# Patient Record
Sex: Female | Born: 1995 | Race: Black or African American | Hispanic: No | Smoking: Never smoker
Health system: Southern US, Community
[De-identification: ages and names within clinical notes are randomized; demographics above are authoritative.]

## PROBLEM LIST (undated history)

## (undated) ENCOUNTER — Inpatient Hospital Stay (HOSPITAL_COMMUNITY): Payer: Medicaid Other

## (undated) DIAGNOSIS — R011 Cardiac murmur, unspecified: Secondary | ICD-10-CM

## (undated) DIAGNOSIS — O24419 Gestational diabetes mellitus in pregnancy, unspecified control: Secondary | ICD-10-CM

## (undated) HISTORY — PX: NO PAST SURGERIES: SHX2092

## (undated) HISTORY — DX: Morbid (severe) obesity due to excess calories: E66.01

## (undated) HISTORY — DX: Cardiac murmur, unspecified: R01.1

---

## 2018-08-14 ENCOUNTER — Ambulatory Visit (INDEPENDENT_AMBULATORY_CARE_PROVIDER_SITE_OTHER): Payer: Medicaid Other

## 2018-08-14 ENCOUNTER — Encounter (HOSPITAL_COMMUNITY): Payer: Self-pay | Admitting: *Deleted

## 2018-08-14 ENCOUNTER — Encounter: Payer: Self-pay | Admitting: Obstetrics

## 2018-08-14 ENCOUNTER — Other Ambulatory Visit: Payer: Self-pay

## 2018-08-14 ENCOUNTER — Inpatient Hospital Stay (HOSPITAL_COMMUNITY): Payer: Medicaid Other

## 2018-08-14 ENCOUNTER — Inpatient Hospital Stay (HOSPITAL_COMMUNITY)
Admission: AD | Admit: 2018-08-14 | Discharge: 2018-08-14 | Disposition: A | Discharge status: Home / Self Care | Payer: Medicaid Other | Attending: Obstetrics and Gynecology | Admitting: Obstetrics and Gynecology

## 2018-08-14 DIAGNOSIS — R102 Pelvic and perineal pain: Secondary | ICD-10-CM | POA: Insufficient documentation

## 2018-08-14 DIAGNOSIS — O26891 Other specified pregnancy related conditions, first trimester: Secondary | ICD-10-CM | POA: Diagnosis not present

## 2018-08-14 DIAGNOSIS — O468X1 Other antepartum hemorrhage, first trimester: Secondary | ICD-10-CM

## 2018-08-14 DIAGNOSIS — Z6711 Type A blood, Rh negative: Secondary | ICD-10-CM

## 2018-08-14 DIAGNOSIS — O209 Hemorrhage in early pregnancy, unspecified: Secondary | ICD-10-CM

## 2018-08-14 DIAGNOSIS — O418X10 Other specified disorders of amniotic fluid and membranes, first trimester, not applicable or unspecified: Secondary | ICD-10-CM | POA: Diagnosis not present

## 2018-08-14 DIAGNOSIS — Z3201 Encounter for pregnancy test, result positive: Secondary | ICD-10-CM

## 2018-08-14 DIAGNOSIS — O208 Other hemorrhage in early pregnancy: Secondary | ICD-10-CM | POA: Insufficient documentation

## 2018-08-14 DIAGNOSIS — Z3200 Encounter for pregnancy test, result unknown: Secondary | ICD-10-CM

## 2018-08-14 DIAGNOSIS — Z3A01 Less than 8 weeks gestation of pregnancy: Secondary | ICD-10-CM | POA: Diagnosis not present

## 2018-08-14 LAB — URINALYSIS, ROUTINE W REFLEX MICROSCOPIC
Bilirubin Urine: NEGATIVE
Glucose, UA: NEGATIVE mg/dL
Ketones, ur: 5 mg/dL — AB
Leukocytes,Ua: NEGATIVE
Nitrite: NEGATIVE
Protein, ur: 100 mg/dL — AB
Specific Gravity, Urine: 1.025 (ref 1.005–1.030)
pH: 5 (ref 5.0–8.0)

## 2018-08-14 LAB — POCT URINE PREGNANCY: Preg Test, Ur: POSITIVE — AB

## 2018-08-14 LAB — WET PREP, GENITAL
Sperm: NONE SEEN
Trich, Wet Prep: NONE SEEN
Yeast Wet Prep HPF POC: NONE SEEN

## 2018-08-14 LAB — CBC
HCT: 37.4 % (ref 36.0–46.0)
Hemoglobin: 12.8 g/dL (ref 12.0–15.0)
MCH: 31.8 pg (ref 26.0–34.0)
MCHC: 34.2 g/dL (ref 30.0–36.0)
MCV: 92.8 fL (ref 80.0–100.0)
Platelets: 264 10*3/uL (ref 150–400)
RBC: 4.03 MIL/uL (ref 3.87–5.11)
RDW: 12.1 % (ref 11.5–15.5)
WBC: 11.7 10*3/uL — ABNORMAL HIGH (ref 4.0–10.5)
nRBC: 0 % (ref 0.0–0.2)

## 2018-08-14 LAB — HCG, QUANTITATIVE, PREGNANCY: hCG, Beta Chain, Quant, S: 66532 m[IU]/mL — ABNORMAL HIGH (ref <5)

## 2018-08-14 LAB — ABO/RH: ABO/RH(D): A NEG

## 2018-08-14 MED ORDER — RHO D IMMUNE GLOBULIN 1500 UNIT/2ML IJ SOSY
300.0000 ug | PREFILLED_SYRINGE | Freq: Once | INTRAMUSCULAR | Status: AC
  Administered 2018-08-14: 300 ug via INTRAMUSCULAR
  Filled 2018-08-14: qty 2

## 2018-08-14 NOTE — Progress Notes (Signed)
Vaginal cultures obtained by this RN. 

## 2018-08-14 NOTE — MAU Provider Note (Signed)
History     CSN: [id number]  Arrival date and time: 08/14/18 [id number]   First Provider Initiated Contact with Patient 08/14/18 1912      Chief Complaint  Patient presents with  . Vaginal Bleeding   Connie Fisher is a 23 y.o. G1P0 at 2946w0d who presents today with vaginal bleeding. She was seen in the office today for UPT. The bleeding started just prior to arrival here.   Vaginal Bleeding  The patient's primary symptoms include pelvic pain and vaginal bleeding. This is a new problem. The current episode started today (approx 30 mins PTA ). The problem occurs constantly. The problem has been gradually improving. The pain is mild. The problem affects both sides. She is pregnant. Associated symptoms include back pain and nausea. Pertinent negatives include no chills, dysuria, fever, frequency or vomiting. The vaginal discharge was bloody. The vaginal bleeding is lighter than menses. She has been passing clots (about the size of a grape ). She has not been passing tissue. Nothing aggravates the symptoms. She has tried nothing for the symptoms. Sexual activity: denies intercourse in the last 24 hours.  Her menstrual history has been irregular (LMP 06/26/2018).    OB History    Gravida  1   Para      Term      Preterm      AB      Living        SAB      TAB      Ectopic      Multiple      Live Births              Past Medical History:  Diagnosis Date  . Medical history non-contributory     Past Surgical History:  Procedure Laterality Date  . NO PAST SURGERIES      Family History  Problem Relation Age of Onset  . Hypertension Mother   . Diabetes Father     Social History   Tobacco Use  . Smoking status: Never Smoker  . Smokeless tobacco: Never Used  Substance Use Topics  . Alcohol use: Not Currently  . Drug use: Never    Allergies: Not on File  No medications prior to admission.    Review of Systems  Constitutional: Negative for chills and fever.   Gastrointestinal: Positive for nausea. Negative for vomiting.  Genitourinary: Positive for pelvic pain and vaginal bleeding. Negative for dysuria and frequency.  Musculoskeletal: Positive for back pain.   Physical Exam   Blood pressure (!) 117/57, pulse 91, temperature 97.9 F (36.6 C), temperature source Oral, resp. rate 18, height 5\' 3"  (1.6 m), weight 113 kg, last menstrual period 06/26/2018, SpO2 100 %.  Physical Exam  Nursing note and vitals reviewed. Constitutional: She is oriented to person, place, and time. She appears well-developed and well-nourished. No distress.  HENT:  Head: Normocephalic.  Cardiovascular: Normal rate.  Respiratory: Effort normal.  GI: Soft. There is no abdominal tenderness. There is no rebound.  Neurological: She is alert and oriented to person, place, and time.  Skin: Skin is warm and dry.  Psychiatric: She has a normal mood and affect.   Results for orders placed or performed during the hospital encounter of 08/14/18 (from the past 24 hour(s))  CBC     Status: Abnormal   Collection Time: 08/14/18  6:55 PM  Result Value Ref Range   WBC 11.7 (H) 4.0 - 10.5 K/uL   RBC 4.03 3.87 - 5.11 MIL/uL  Hemoglobin 12.8 12.0 - 15.0 g/dL   HCT 37.4 36.0 - 46.0 %   MCV 92.8 80.0 - 100.0 fL   MCH 31.8 26.0 - 34.0 pg   MCHC 34.2 30.0 - 36.0 g/dL   RDW 12.1 11.5 - 15.5 %   Platelets 264 150 - 400 K/uL   nRBC 0.0 0.0 - 0.2 %  ABO/Rh     Status: None   Collection Time: 08/14/18  6:55 PM  Result Value Ref Range   ABO/RH(D)      A NEG Performed at [facility] [address] [place], [place] [address]   Urinalysis, Routine w reflex microscopic     Status: Abnormal   Collection Time: 08/14/18  7:03 PM  Result Value Ref Range   Color, Urine YELLOW YELLOW   APPearance HAZY (A) CLEAR   Specific Gravity, Urine 1.025 1.005 - 1.030   pH 5.0 5.0 - 8.0   Glucose, UA NEGATIVE NEGATIVE mg/dL   Hgb urine dipstick LARGE (A) NEGATIVE   Bilirubin Urine NEGATIVE  NEGATIVE   Ketones, ur 5 (A) NEGATIVE mg/dL   Protein, ur 100 (A) NEGATIVE mg/dL   Nitrite NEGATIVE NEGATIVE   Leukocytes,Ua NEGATIVE NEGATIVE   RBC / HPF 0-5 0 - 5 RBC/hpf   WBC, UA 0-5 0 - 5 WBC/hpf   Bacteria, UA RARE (A) NONE SEEN   Squamous Epithelial / LPF 0-5 0 - 5   Mucus PRESENT   Wet prep, genital     Status: Abnormal   Collection Time: 08/14/18  7:03 PM  Result Value Ref Range   Yeast Wet Prep HPF POC NONE SEEN NONE SEEN   Trich, Wet Prep NONE SEEN NONE SEEN   Clue Cells Wet Prep HPF POC PRESENT (A) NONE SEEN   WBC, Wet Prep HPF POC FEW (A) NONE SEEN   Sperm NONE SEEN   Rh IG workup (includes ABO/Rh)     Status: None (Preliminary result)   Collection Time: 08/14/18  7:38 PM  Result Value Ref Range   Gestational Age(Wks) 7    ABO/RH(D)      A NEG Performed at [facility] [address] [place], [place] [address]    Antibody Screen PENDING      MAU Course  Procedures  MDM A negative, rhogam given today   Assessment and Plan   1. Vaginal bleeding affecting early pregnancy   2. Pelvic pain in pregnancy, antepartum, first trimester   3. Subchorionic hematoma in first trimester, single or unspecified fetus   4. [redacted] weeks gestation of pregnancy   5. Type A blood, Rh negative    DC home Comfort measures reviewed  1st Trimester precautions  Bleeding precautions RX: none  Return to MAU as needed FU with OB as planned  Ali Chuk for [facility] Follow up.   Specialty:  Obstetrics and Gynecology Contact information: [address], [id number] [place] [id number] [place] DNP, CNM  08/14/18  8:06 PM

## 2018-08-14 NOTE — MAU Note (Signed)
Started bleeding a lot a little while ago, with clots coming out.  Is about [redacted] wks pregnant. Little pain in lower and back.

## 2018-08-14 NOTE — Discharge Instructions (Signed)
Subchorionic Hematoma ° °A subchorionic hematoma is a gathering of blood between the outer wall of the embryo (chorion) and the inner wall of the womb (uterus). °This condition can cause vaginal bleeding. If they cause little or no vaginal bleeding, early small hematomas usually shrink on their own and do not affect your baby or pregnancy. When bleeding starts later in pregnancy, or if the hematoma is larger or occurs in older pregnant women, the condition may be more serious. Larger hematomas may get bigger, which increases the chances of miscarriage. This condition also increases the risk of: °· Premature separation of the placenta from the uterus. °· Premature (preterm) labor. °· Stillbirth. °What are the causes? °The exact cause of this condition is not known. It occurs when blood is trapped between the placenta and the uterine wall because the placenta has separated from the original site of implantation. °What increases the risk? °You are more likely to develop this condition if: °· You were treated with fertility medicines. °· You conceived through in vitro fertilization (IVF). °What are the signs or symptoms? °Symptoms of this condition include: °· Vaginal spotting or bleeding. °· Contractions of the uterus. These cause abdominal pain. °Sometimes you may have no symptoms and the bleeding may only be seen when ultrasound images are taken (transvaginal ultrasound). °How is this diagnosed? °This condition is diagnosed based on a physical exam. This includes a pelvic exam. You may also have other tests, including: °· Blood tests. °· Urine tests. °· Ultrasound of the abdomen. °How is this treated? °Treatment for this condition can vary. Treatment may include: °· Watchful waiting. You will be monitored closely for any changes in bleeding. During this stage: °? The hematoma may be reabsorbed by the body. °? The hematoma may separate the fluid-filled space containing the embryo (gestational sac) from the wall of the  womb (endometrium). °· Medicines. °· Activity restriction. This may be needed until the bleeding stops. °Follow these instructions at home: °· Stay on bed rest if told to do so by your health care provider. °· Do not lift anything that is heavier than 10 lbs. (4.5 kg) or as told by your health care provider. °· Do not use any products that contain nicotine or tobacco, such as cigarettes and e-cigarettes. If you need help quitting, ask your health care provider. °· Track and write down the number of pads you use each day and how soaked (saturated) they are. °· Do not use tampons. °· Keep all follow-up visits as told by your health care provider. This is important. Your health care provider may ask you to have follow-up blood tests or ultrasound tests or both. °Contact a health care provider if: °· You have any vaginal bleeding. °· You have a fever. °Get help right away if: °· You have severe cramps in your stomach, back, abdomen, or pelvis. °· You pass large clots or tissue. Save any tissue for your health care provider to look at. °· You have more vaginal bleeding, and you faint or become lightheaded or weak. °Summary °· A subchorionic hematoma is a gathering of blood between the outer wall of the placenta and the uterus. °· This condition can cause vaginal bleeding. °· Sometimes you may have no symptoms and the bleeding may only be seen when ultrasound images are taken. °· Treatment may include watchful waiting, medicines, or activity restriction. °This information is not intended to replace advice given to you by your health care provider. Make sure you discuss any questions you   have with your health care provider. °Document Released: 06/08/2006 Document Revised: 04/19/2016 Document Reviewed: 04/19/2016 °Elsevier Interactive Patient Education © 2019 [employer] ° °

## 2018-08-14 NOTE — Progress Notes (Signed)
Pt is here for UPT. LMP 06/26/18. UPT is positive. Pt instructed to return around 09/04/18 for new OB appt. Safe medication list given.

## 2018-08-15 LAB — RH IG WORKUP (INCLUDES ABO/RH)
ABO/RH(D): A NEG
Antibody Screen: NEGATIVE
Gestational Age(Wks): 7
Unit division: 0

## 2018-08-16 LAB — GC/CHLAMYDIA PROBE AMP (~~LOC~~) NOT AT ARMC
Chlamydia: NEGATIVE
Neisseria Gonorrhea: NEGATIVE

## 2018-08-28 ENCOUNTER — Other Ambulatory Visit: Payer: Self-pay

## 2018-08-28 ENCOUNTER — Ambulatory Visit (INDEPENDENT_AMBULATORY_CARE_PROVIDER_SITE_OTHER): Payer: Self-pay

## 2018-08-28 DIAGNOSIS — Z3400 Encounter for supervision of normal first pregnancy, unspecified trimester: Secondary | ICD-10-CM

## 2018-08-28 NOTE — Progress Notes (Signed)
I connected with  [facility] [place] on 08/28/18 by a video enabled telemedicine application and verified that I am speaking with the correct person using two identifiers.   I discussed the limitations of evaluation and management by telemedicine. The patient expressed understanding and agreed to proceed.  New OB Intake done, Babyscripts App downloaded.  Patient has New OB appt 09/10/2018 at 1:45 pm.

## 2018-09-10 ENCOUNTER — Other Ambulatory Visit: Payer: Self-pay

## 2018-09-10 ENCOUNTER — Other Ambulatory Visit (HOSPITAL_COMMUNITY)
Admission: RE | Admit: 2018-09-10 | Discharge: 2018-09-10 | Disposition: A | Discharge status: Home / Self Care | Payer: Medicaid Other | Source: Ambulatory Visit | Attending: Obstetrics & Gynecology | Admitting: Obstetrics & Gynecology

## 2018-09-10 ENCOUNTER — Encounter: Payer: Self-pay | Admitting: Obstetrics & Gynecology

## 2018-09-10 ENCOUNTER — Ambulatory Visit (INDEPENDENT_AMBULATORY_CARE_PROVIDER_SITE_OTHER): Payer: Medicaid Other | Admitting: Obstetrics & Gynecology

## 2018-09-10 VITALS — BP 91/58 | HR 80 | Wt 247.8 lb

## 2018-09-10 DIAGNOSIS — N760 Acute vaginitis: Secondary | ICD-10-CM

## 2018-09-10 DIAGNOSIS — Z3401 Encounter for supervision of normal first pregnancy, first trimester: Secondary | ICD-10-CM

## 2018-09-10 DIAGNOSIS — O26891 Other specified pregnancy related conditions, first trimester: Secondary | ICD-10-CM

## 2018-09-10 DIAGNOSIS — O99211 Obesity complicating pregnancy, first trimester: Secondary | ICD-10-CM

## 2018-09-10 DIAGNOSIS — Z3689 Encounter for other specified antenatal screening: Secondary | ICD-10-CM

## 2018-09-10 DIAGNOSIS — O99210 Obesity complicating pregnancy, unspecified trimester: Secondary | ICD-10-CM | POA: Insufficient documentation

## 2018-09-10 DIAGNOSIS — O219 Vomiting of pregnancy, unspecified: Secondary | ICD-10-CM

## 2018-09-10 DIAGNOSIS — Z3A10 10 weeks gestation of pregnancy: Secondary | ICD-10-CM

## 2018-09-10 DIAGNOSIS — R12 Heartburn: Secondary | ICD-10-CM

## 2018-09-10 DIAGNOSIS — Z3481 Encounter for supervision of other normal pregnancy, first trimester: Secondary | ICD-10-CM | POA: Diagnosis not present

## 2018-09-10 DIAGNOSIS — Z6791 Unspecified blood type, Rh negative: Secondary | ICD-10-CM

## 2018-09-10 DIAGNOSIS — B9689 Other specified bacterial agents as the cause of diseases classified elsewhere: Secondary | ICD-10-CM

## 2018-09-10 DIAGNOSIS — O23591 Infection of other part of genital tract in pregnancy, first trimester: Secondary | ICD-10-CM

## 2018-09-10 DIAGNOSIS — O360910 Maternal care for other rhesus isoimmunization, first trimester, not applicable or unspecified: Secondary | ICD-10-CM

## 2018-09-10 MED ORDER — DOXYLAMINE-PYRIDOXINE 10-10 MG PO TBEC: 2.0000 | DELAYED_RELEASE_TABLET | Freq: Every day | ORAL | 5 refills | Status: DC

## 2018-09-10 MED ORDER — FAMOTIDINE 20 MG PO TABS: 20.0000 mg | ORAL_TABLET | Freq: Two times a day (BID) | ORAL | 3 refills | Status: DC

## 2018-09-10 MED ORDER — BLOOD PRESSURE MONITOR KIT: 1.0000 | PACK | 0 refills | Status: DC

## 2018-09-10 MED ORDER — ASPIRIN EC 81 MG PO TBEC: 81.0000 mg | DELAYED_RELEASE_TABLET | Freq: Every day | ORAL | 2 refills | Status: DC

## 2018-09-10 NOTE — Progress Notes (Signed)
History:   Connie Fisher is a 23 y.o. G1P0 at 29w6dby LMP being seen today for her first obstetrical visit.  Her medical history is significant for morbid obesity. Patient does intend to breast feed. Pregnancy history fully reviewed.  Patient reports heartburn, nausea and vomiting. Desires medication to help.      HISTORY: OB History  Gravida Para Term Preterm AB Living  1 0 0 0 0 0  SAB TAB Ectopic Multiple Live Births  0 0 0 0 0    # Outcome Date GA Lbr Len/2nd Weight Sex Delivery Anes PTL Lv  1 Current           She has never had a pap smear, denies any GYN issues. Denies history of STIs.  Past Medical History:  Diagnosis Date  . Heart murmur    AS A BABY  . Morbid obesity ([facility]    Past Surgical History:  Procedure Laterality Date  . NO PAST SURGERIES     Family History  Problem Relation Age of Onset  . Hypertension Mother   . Diabetes Father   . Asthma Brother   . Arthritis Paternal Uncle   . Cancer Paternal Uncle   . Diabetes Paternal Grandmother    Social History   Tobacco Use  . Smoking status: Never Smoker  . Smokeless tobacco: Never Used  Substance Use Topics  . Alcohol use: Not Currently  . Drug use: Never   No Known Allergies Current Outpatient Medications on File Prior to Visit  Medication Sig Dispense Refill  . Prenatal Vit w/Fe-Methylfol-FA (PNV PO) Take by mouth.     No current facility-administered medications on file prior to visit.     Review of Systems Pertinent items noted in HPI and remainder of comprehensive ROS otherwise negative. Physical Exam:   Vitals:   09/10/18 1349  BP: (!) 91/58  Pulse: 80  Weight: 247 lb 12.8 oz (112.4 kg)   Fetal Heart Rate (bpm): 170 +sono**Bedside Ultrasound for FHR check: Patient informed that the ultrasound is considered a limited obstetric ultrasound and is not intended to be a complete ultrasound exam.  Patient also informed that the ultrasound is not being completed with the intent of  assessing for fetal or placental anomalies or any pelvic abnormalities.  Explained that the purpose of today's ultrasound is to assess for fetal heart rate.  Patient acknowledges the purpose of the exam and the limitations of the study.  Uterus:     Pelvic Exam: Perineum: no hemorrhoids, normal perineum   Vulva: normal external genitalia, no lesions   Vagina:  normal mucosa, normal discharge   Cervix: no lesions and normal, pap smear done.    Adnexa: normal adnexa and no mass, fullness, tenderness   Bony Pelvis: average  System: General: well-developed, well-nourished female in no acute distress   Breasts:  normal appearance, no masses or tenderness bilaterally   Skin: normal coloration and turgor, no rashes   Neurologic: oriented, normal, negative, normal mood   Extremities: normal strength, tone, and muscle mass, ROM of all joints is normal   HEENT PERRLA, extraocular movement intact and sclera clear, anicteric   Mouth/Teeth mucous membranes moist, pharynx normal without lesions and dental hygiene good   Neck supple and no masses   Cardiovascular: regular rate and rhythm   Respiratory:  no respiratory distress, normal breath sounds   Abdomen: soft, non-tender; bowel sounds normal; no masses,  no organomegaly      Assessment:    Pregnancy:  G1P0 Patient Active Problem List   Diagnosis Date Noted  . Maternal morbid obesity, antepartum ([place]) 09/10/2018  . Supervision of normal first pregnancy 08/28/2018     Plan:    1. Nausea/vomiting in pregnancy Encouraged ginger containing aids. Diclegis also prescribed.  - Doxylamine-Pyridoxine (DICLEGIS) 10-10 MG TBEC; Take 2 tablets by mouth at bedtime. If symptoms persist, add one tablet in the morning and one in the afternoon  Dispense: 100 tablet; Refill: 5  2. Heartburn during pregnancy in first trimester Tums recommended, Pepcid also prescribed. - famotidine (PEPCID) 20 MG tablet; Take 1 tablet (20 mg total) by mouth 2 (two) times  daily.  Dispense: 60 tablet; Refill: 3  3. Maternal morbid obesity, antepartum ([facility]) '[x]'  Aspirin 81 mg daily after 12 weeks ordered '[x]'  Nutrition consult ordered '[x]'  Recommended weight gain 11-20 lbs for singleton pregnancy (IOM guidelines) . Higher class of obesity patients recommended to gain closer to lower limit  . Weight loss is associated with adverse outcomes '[x]'  Baseline and surveillance labs '[ ]'  Growth scans every 4-6 weeks as needed (fundal height likely inadequate in morbidly obese patients) - Comprehensive metabolic panel - Hemoglobin A1c - TSH - Protein / creatinine ratio, urine - aspirin EC 81 MG tablet; Take 1 tablet (81 mg total) by mouth daily. Take after 12 weeks for prevention of preeclampsia later in pregnancy  Dispense: 300 tablet; Refill: 2 - Referral to Nutrition and Diabetes Services - [place] MFM OB DETAIL +14 WK; Future  4. Encounter for fetal anatomic survey Anatomy scan ordered - [place] MFM OB DETAIL +14 WK; Future  5. Encounter for supervision of normal first pregnancy in first trimester - Enroll Patient in Babyscripts - Babyscripts Schedule Optimization - Culture, OB Urine - Cervicovaginal ancillary only( [facility]) - Cytology - PAP( [facility]) - Obstetric Panel, Including HIV - Genetic Screening - Blood Pressure Monitor KIT; 1 kit by Does not apply route once a week. Check BP weekly.  Large Cuff. DX:  Z13.6  Z34.86  Dispense: 1 kit; Refill: 0 - Prenatal Vit w/Fe-Methylfol-FA (PNV PO); Take by mouth. Initial labs drawn. Continue prenatal vitamins. Genetic Screening discussed, NIPS: ordered. Ultrasound discussed; fetal anatomic survey: ordered. Problem list reviewed and updated. The nature of [facility] with multiple MDs and other Advanced Practice Providers was explained to patient; also emphasized that residents, students are part of our team. Routine obstetric precautions reviewed. Return in about 4 weeks (around  10/08/2018) for Virtual OB Visit.     Verita Schneiders, MD, [facility] [employer], [place]

## 2018-09-10 NOTE — Patient Instructions (Addendum)
First Trimester of Pregnancy The first trimester of pregnancy is from week 1 until the end of week 13 (months 1 through 3). A week after a sperm fertilizes an egg, the egg will implant on the wall of the uterus. This embryo will begin to develop into a baby. Genes from you and your partner will form the baby. The female genes will determine whether the baby will be a boy or a girl. At 6-8 weeks, the eyes and face will be formed, and the heartbeat can be seen on ultrasound. At the end of 12 weeks, all the baby's organs will be formed. Now that you are pregnant, you will want to do everything you can to have a healthy baby. Two of the most important things are to get good prenatal care and to follow your health care provider's instructions. Prenatal care is all the medical care you receive before the baby's birth. This care will help prevent, find, and treat any problems during the pregnancy and childbirth. Body changes during your first trimester Your body goes through many changes during pregnancy. The changes vary from woman to woman.  You may gain or lose a couple of pounds at first.  You may feel sick to your stomach (nauseous) and you may throw up (vomit). If the vomiting is uncontrollable, call your health care provider.  You may tire easily.  You may develop headaches that can be relieved by medicines. All medicines should be approved by your health care provider.  You may urinate more often. Painful urination may mean you have a bladder infection.  You may develop heartburn as a result of your pregnancy.  You may develop constipation because certain hormones are causing the muscles that push stool through your intestines to slow down.  You may develop hemorrhoids or swollen veins (varicose veins).  Your breasts may begin to grow larger and become tender. Your nipples may stick out more, and the tissue that surrounds them (areola) may become darker.  Your gums may bleed and may be  sensitive to brushing and flossing.  Dark spots or blotches (chloasma, mask of pregnancy) may develop on your face. This will likely fade after the baby is born.  Your menstrual periods will stop.  You may have a loss of appetite.  You may develop cravings for certain kinds of food.  You may have changes in your emotions from day to day, such as being excited to be pregnant or being concerned that something may go wrong with the pregnancy and baby.  You may have more vivid and strange dreams.  You may have changes in your hair. These can include thickening of your hair, rapid growth, and changes in texture. Some women also have hair loss during or after pregnancy, or hair that feels dry or thin. Your hair will most likely return to normal after your baby is born. What to expect at prenatal visits During a routine prenatal visit:  You will be weighed to make sure you and the baby are growing normally.  Your blood pressure will be taken.  Your abdomen will be measured to track your baby's growth.  The fetal heartbeat will be listened to between weeks 10 and 14 of your pregnancy.  Test results from any previous visits will be discussed. Your health care provider may ask you:  How you are feeling.  If you are feeling the baby move.  If you have had any abnormal symptoms, such as leaking fluid, bleeding, severe headaches, or abdominal   cramping.  If you are using any tobacco products, including cigarettes, chewing tobacco, and electronic cigarettes.  If you have any questions. Other tests that may be performed during your first trimester include:  Blood tests to find your blood type and to check for the presence of any previous infections. The tests will also be used to check for low iron levels (anemia) and protein on red blood cells (Rh antibodies). Depending on your risk factors, or if you previously had diabetes during pregnancy, you may have tests to check for high blood sugar  that affects pregnant women (gestational diabetes).  Urine tests to check for infections, diabetes, or protein in the urine.  An ultrasound to confirm the proper growth and development of the baby.  Fetal screens for spinal cord problems (spina bifida) and Down syndrome.  HIV (human immunodeficiency virus) testing. Routine prenatal testing includes screening for HIV, unless you choose not to have this test.  You may need other tests to make sure you and the baby are doing well. Follow these instructions at home: Medicines  Follow your health care provider's instructions regarding medicine use. Specific medicines may be either safe or unsafe to take during pregnancy.  Take a prenatal vitamin that contains at least 600 micrograms (mcg) of folic acid.  If you develop constipation, try taking a stool softener if your health care provider approves. Eating and drinking   Eat a balanced diet that includes fresh fruits and vegetables, whole grains, good sources of protein such as meat, eggs, or tofu, and low-fat dairy. Your health care provider will help you determine the amount of weight gain that is right for you.  Avoid raw meat and uncooked cheese. These carry germs that can cause birth defects in the baby.  Eating four or five small meals rather than three large meals a day may help relieve nausea and vomiting. If you start to feel nauseous, eating a few soda crackers can be helpful. Drinking liquids between meals, instead of during meals, also seems to help ease nausea and vomiting.  Limit foods that are high in fat and processed sugars, such as fried and sweet foods.  To prevent constipation: ? Eat foods that are high in fiber, such as fresh fruits and vegetables, whole grains, and beans. ? Drink enough fluid to keep your urine clear or pale yellow. Activity  Exercise only as directed by your health care provider. Most women can continue their usual exercise routine during  pregnancy. Try to exercise for 30 minutes at least 5 days a week. Exercising will help you: ? Control your weight. ? Stay in shape. ? Be prepared for labor and delivery.  Experiencing pain or cramping in the lower abdomen or lower back is a good sign that you should stop exercising. Check with your health care provider before continuing with normal exercises.  Try to avoid standing for long periods of time. Move your legs often if you must stand in one place for a long time.  Avoid heavy lifting.  Wear low-heeled shoes and practice good posture.  You may continue to have sex unless your health care provider tells you not to. Relieving pain and discomfort  Wear a good support bra to relieve breast tenderness.  Take warm sitz baths to soothe any pain or discomfort caused by hemorrhoids. Use hemorrhoid cream if your health care provider approves.  Rest with your legs elevated if you have leg cramps or low back pain.  If you develop varicose veins in   your legs, wear support hose. Elevate your feet for 15 minutes, 3-4 times a day. Limit salt in your diet. Prenatal care  Schedule your prenatal visits by the twelfth week of pregnancy. They are usually scheduled monthly at first, then more often in the last 2 months before delivery.  Write down your questions. Take them to your prenatal visits.  Keep all your prenatal visits as told by your health care provider. This is important. Safety  Wear your seat belt at all times when driving.  Make a list of emergency phone numbers, including numbers for family, friends, the hospital, and police and fire departments. General instructions  Ask your health care provider for a referral to a local prenatal education class. Begin classes no later than the beginning of month 6 of your pregnancy.  Ask for help if you have counseling or nutritional needs during pregnancy. Your health care provider can offer advice or refer you to specialists for help  with various needs.  Do not use hot tubs, steam rooms, or saunas.  Do not douche or use tampons or scented sanitary pads.  Do not cross your legs for long periods of time.  Avoid cat litter boxes and soil used by cats. These carry germs that can cause birth defects in the baby and possibly loss of the fetus by miscarriage or stillbirth.  Avoid all smoking, herbs, alcohol, and medicines not prescribed by your health care provider. Chemicals in these products affect the formation and growth of the baby.  Do not use any products that contain nicotine or tobacco, such as cigarettes and e-cigarettes. If you need help quitting, ask your health care provider. You may receive counseling support and other resources to help you quit.  Schedule a dentist appointment. At home, brush your teeth with a soft toothbrush and be gentle when you floss. Contact a health care provider if:  You have dizziness.  You have mild pelvic cramps, pelvic pressure, or nagging pain in the abdominal area.  You have persistent nausea, vomiting, or diarrhea.  You have a bad smelling vaginal discharge.  You have pain when you urinate.  You notice increased swelling in your face, hands, legs, or ankles.  You are exposed to fifth disease or chickenpox.  You are exposed to German measles (rubella) and have never had it. Get help right away if:  You have a fever.  You are leaking fluid from your vagina.  You have spotting or bleeding from your vagina.  You have severe abdominal cramping or pain.  You have rapid weight gain or loss.  You vomit blood or material that looks like coffee grounds.  You develop a severe headache.  You have shortness of breath.  You have any kind of trauma, such as from a fall or a car accident. Summary  The first trimester of pregnancy is from week 1 until the end of week 13 (months 1 through 3).  Your body goes through many changes during pregnancy. The changes vary from  woman to woman.  You will have routine prenatal visits. During those visits, your health care provider will examine you, discuss any test results you may have, and talk with you about how you are feeling. This information is not intended to replace advice given to you by your health care provider. Make sure you discuss any questions you have with your health care provider. Document Released: 02/15/2001 Document Revised: 02/03/2017 Document Reviewed: 02/03/2016 Elsevier Patient Education  2020 Elsevier Inc[employer] Morning Sickness    Sickness  Morning sickness is when a woman feels nauseous during pregnancy. This nauseous feeling may or may not come with vomiting. It often occurs in the morning, but it can be a problem at any time of day. Morning sickness is most common during the first trimester. In some cases, it may continue throughout pregnancy. Although morning sickness is unpleasant, it is usually harmless unless the woman develops severe and continual vomiting (hyperemesis gravidarum), a condition that requires more intense treatment. What are the causes? The exact cause of this condition is not known, but it seems to be related to normal hormonal changes that occur in pregnancy. What increases the risk? You are more likely to develop this condition if:  You experienced nausea or vomiting before your pregnancy.  You had morning sickness during a previous pregnancy.  You are pregnant with more than one baby, such as twins. What are the signs or symptoms? Symptoms of this condition include:  Nausea.  Vomiting. How is this diagnosed? This condition is usually diagnosed based on your signs and symptoms. How is this treated? In many cases, treatment is not needed for this condition. Making some changes to what you eat may help to control symptoms. Your health care provider may also prescribe or recommend:  Vitamin B6 supplements.  Anti-nausea medicines.  Ginger. Follow these instructions at  home: Medicines  Take over-the-counter and prescription medicines only as told by your health care provider. Do not use any prescription, over-the-counter, or herbal medicines for morning sickness without first talking with your health care provider.  Taking multivitamins before getting pregnant can prevent or decrease the severity of morning sickness in most women. Eating and drinking  Eat a piece of dry toast or crackers before getting out of bed in the morning.  Eat 5 or 6 small meals a day.  Eat dry and bland foods, such as rice or a baked potato. Foods that are high in carbohydrates are often helpful.  Avoid greasy, fatty, and spicy foods.  Have someone cook for you if the smell of any food causes nausea and vomiting.  If you feel nauseous after taking prenatal vitamins, take the vitamins at night or with a snack.  Snack on protein foods between meals if you are hungry. Nuts, yogurt, and cheese are good options.  Drink fluids throughout the day.  Try ginger ale made with real ginger, ginger tea made from fresh grated ginger, or ginger candies. General instructions  Do not use any products that contain nicotine or tobacco, such as cigarettes and e-cigarettes. If you need help quitting, ask your health care provider.  Get an air purifier to keep the air in your house free of odors.  Get plenty of fresh air.  Try to avoid odors that trigger your nausea.  Consider trying these methods to help relieve symptoms: ? Wearing an acupressure wristband. These wristbands are often worn for seasickness. ? Acupuncture. Contact a health care provider if:  Your home remedies are not working and you need medicine.  You feel dizzy or light-headed.  You are losing weight. Get help right away if:  You have persistent and uncontrolled nausea and vomiting.  You faint.  You have severe pain in your abdomen. Summary  Morning sickness is when a woman feels nauseous during pregnancy.  This nauseous feeling may or may not come with vomiting.  Morning sickness is most common during the first trimester.  It often occurs in the morning, but it can be a problem at  any time of day.  In many cases, treatment is not needed for this condition. Making some changes to what you eat may help to control symptoms. This information is not intended to replace advice given to you by your health care provider. Make sure you discuss any questions you have with your health care provider. Document Released: 04/14/2006 Document Revised: 02/03/2017 Document Reviewed: 03/26/2016 Elsevier Patient Education  2020 [employer]vier Inc.   Heartburn During Pregnancy Heartburn is a type of pain or discomfort that can happen in the throat or chest. It is often described as a burning sensation. Heartburn is common during pregnancy because:  A hormone (progesterone) that is released during pregnancy may relax the valve (lower esophageal sphincter, or LES) that separates the esophagus from the stomach. This allows stomach acid to move up into the esophagus, causing heartburn.  The uterus gets larger and pushes up on the stomach, which pushes more acid into the esophagus. This is especially true in the later stages of pregnancy. Heartburn usually goes away or gets better after giving birth. What are the causes? Heartburn is caused by stomach acid backing up into the esophagus (reflux). Reflux can be triggered by:  Changing hormone levels.  Large meals.  Certain foods and beverages, such as coffee, chocolate, onions, and peppermint.  Exercise.  Increased stomach acid production. What increases the risk? You are more likely to experience heartburn during pregnancy if you:  Had heartburn prior to becoming pregnant.  Have been pregnant more than once before.  Are overweight or obese. The likelihood that you will get heartburn also increases as you get farther along in your pregnancy, especially during  the last trimester. What are the signs or symptoms? Symptoms of this condition include:  Burning pain in the chest or lower throat.  Bitter taste in the mouth.  Coughing.  Problems swallowing.  Vomiting.  Hoarse voice.  Asthma. Symptoms may get worse when you lie down or bend over. Symptoms are often worse at night. How is this diagnosed? This condition is diagnosed based on:  Your medical history.  Your symptoms.  Blood tests to check for a certain type of bacteria associated with heartburn.  Whether taking heartburn medicine relieves your symptoms.  Examination of the stomach and esophagus using a tube with a light and camera on the end (endoscopy). How is this treated? Treatment varies depending on how severe your symptoms are. Your health care provider may recommend:  Over-the-counter medicines (antacids or acid reducers) for mild heartburn.  Prescription medicines to decrease stomach acid or to protect your stomach lining.  Certain changes in your diet.  Raising the head of your bed so it is higher than the foot of the bed. This helps prevent stomach acid from backing up into the esophagus when you are lying down. Follow these instructions at home: Eating and drinking  Do not drink alcohol during your pregnancy.  Identify foods and beverages that make your symptoms worse, and avoid them.  Beverages that you may want to avoid include: ? Coffee and tea (with or without caffeine). ? Energy drinks and sports drinks. ? Carbonated drinks or sodas. ? Citrus fruit juices.  Foods that you may want to avoid include: ? Chocolate and cocoa. ? Peppermint and mint flavorings. ? Garlic, onions, and horseradish. ? Spicy and acidic foods, including peppers, chili powder, curry powder, vinegar, hot sauces, and barbecue sauce. ? Citrus fruits, such as oranges, lemons, and limes. ? Tomato-based foods, such as red sauce, chili, and  salsa. ? Fried and fatty foods, such as  donuts, french fries, potato chips, and high-fat dressings. ? High-fat meats, such as hot dogs, cold cuts, sausage, ham, and bacon. ? High-fat dairy items, such as whole milk, butter, and cheese.  Eat small, frequent meals instead of large meals.  Avoid drinking large amounts of liquid with your meals.  Avoid eating meals during the 2-3 hours before bedtime.  Avoid lying down right after you eat.  Do not exercise right after you eat. Medicines  Take over-the-counter and prescription medicines only as told by your health care provider.  Do not take aspirin, ibuprofen, or other NSAIDs unless your health care provider tells you to do that.  You may be instructed to avoid medicines that contain sodium bicarbonate. General instructions   If directed, raise the head of your bed about 6 inches (15 cm) by putting blocks under the legs. Sleeping with more pillows does not effectively relieve heartburn because it only changes the position of your head.  Do not use any products that contain nicotine or tobacco, such as cigarettes and e-cigarettes. If you need help quitting, ask your health care provider.  Wear loose-fitting clothing.  Try to reduce your stress, such as with yoga or meditation. If you need help managing stress, ask your health care provider.  Maintain a healthy weight. If you are overweight, work with your health care provider to safely lose weight.  Keep all follow-up visits as told by your health care provider. This is important. Contact a health care provider if:  You develop new symptoms.  Your symptoms do not improve with treatment.  You have unexplained weight loss.  You have difficulty swallowing.  You make loud sounds when you breathe (wheeze).  You have a cough that does not go away.  You have frequent heartburn for more than 2 weeks.  You have nausea or vomiting that does not get better with treatment.  You have pain in your abdomen. Get help right  away if:  You have severe chest pain that spreads to your arm, neck, or jaw.  You feel sweaty, dizzy, or light-headed.  You have shortness of breath.  You have pain when swallowing.  You vomit, and your vomit looks like blood or coffee grounds.  Your stool is bloody or black. This information is not intended to replace advice given to you by your health care provider. Make sure you discuss any questions you have with your health care provider. Document Released: 02/19/2000 Document Revised: 05/22/2017 Document Reviewed: 11/09/2015 Elsevier Patient Education  2020 [employer]

## 2018-09-11 LAB — PROTEIN / CREATININE RATIO, URINE
Creatinine, Urine: 276.8 mg/dL
Protein, Ur: 22.7 mg/dL
Protein/Creat Ratio: 82 mg/g creat (ref 0–200)

## 2018-09-12 DIAGNOSIS — Z6791 Unspecified blood type, Rh negative: Secondary | ICD-10-CM | POA: Insufficient documentation

## 2018-09-12 LAB — OBSTETRIC PANEL, INCLUDING HIV
Basophils Absolute: 0 10*3/uL (ref 0.0–0.2)
Basos: 0 %
EOS (ABSOLUTE): 0.3 10*3/uL (ref 0.0–0.4)
Eos: 3 %
HIV Screen 4th Generation wRfx: NONREACTIVE
Hematocrit: 36 % (ref 34.0–46.6)
Hemoglobin: 12.7 g/dL (ref 11.1–15.9)
Hepatitis B Surface Ag: NEGATIVE
Immature Grans (Abs): 0 10*3/uL (ref 0.0–0.1)
Immature Granulocytes: 0 %
Lymphocytes Absolute: 2 10*3/uL (ref 0.7–3.1)
Lymphs: 21 %
MCH: 31.3 pg (ref 26.6–33.0)
MCHC: 35.3 g/dL (ref 31.5–35.7)
MCV: 89 fL (ref 79–97)
Monocytes Absolute: 0.8 10*3/uL (ref 0.1–0.9)
Monocytes: 8 %
Neutrophils Absolute: 6.6 10*3/uL (ref 1.4–7.0)
Neutrophils: 68 %
Platelets: 257 10*3/uL (ref 150–450)
RBC: 4.06 x10E6/uL (ref 3.77–5.28)
RDW: 11.6 % — ABNORMAL LOW (ref 11.7–15.4)
RPR Ser Ql: NONREACTIVE
Rh Factor: NEGATIVE
Rubella Antibodies, IGG: 5.26 index (ref >0.99)
WBC: 9.8 10*3/uL (ref 3.4–10.8)

## 2018-09-12 LAB — COMPREHENSIVE METABOLIC PANEL
ALT: 14 IU/L (ref 0–32)
AST: 9 IU/L (ref 0–40)
Albumin/Globulin Ratio: 1.6 (ref 1.2–2.2)
Albumin: 4.2 g/dL (ref 3.9–5.0)
Alkaline Phosphatase: 56 IU/L (ref 39–117)
BUN/Creatinine Ratio: 12 (ref 9–23)
BUN: 6 mg/dL (ref 6–20)
Bilirubin Total: <0.2 mg/dL (ref 0.0–1.2)
CO2: 19 mmol/L — ABNORMAL LOW (ref 20–29)
Calcium: 9.1 mg/dL (ref 8.7–10.2)
Chloride: 103 mmol/L (ref 96–106)
Creatinine, Ser: 0.52 mg/dL — ABNORMAL LOW (ref 0.57–1.00)
GFR calc Af Amer: 156 mL/min/{1.73_m2} (ref >59)
GFR calc non Af Amer: 135 mL/min/{1.73_m2} (ref >59)
Globulin, Total: 2.6 g/dL (ref 1.5–4.5)
Glucose: 89 mg/dL (ref 65–99)
Potassium: 3.8 mmol/L (ref 3.5–5.2)
Sodium: 137 mmol/L (ref 134–144)
Total Protein: 6.8 g/dL (ref 6.0–8.5)

## 2018-09-12 LAB — AB SCR+ANTIBODY ID: Antibody Screen: POSITIVE — AB

## 2018-09-12 LAB — CYTOLOGY - PAP: Diagnosis: NEGATIVE

## 2018-09-12 LAB — CERVICOVAGINAL ANCILLARY ONLY
Bacterial vaginitis: POSITIVE — AB
Candida vaginitis: NEGATIVE
Chlamydia: NEGATIVE
Neisseria Gonorrhea: NEGATIVE
Trichomonas: NEGATIVE

## 2018-09-12 LAB — TSH: TSH: 0.462 u[IU]/mL (ref 0.450–4.500)

## 2018-09-12 LAB — HEMOGLOBIN A1C
Est. average glucose Bld gHb Est-mCnc: 114 mg/dL
Hgb A1c MFr Bld: 5.6 % (ref 4.8–5.6)

## 2018-09-12 LAB — URINE CULTURE, OB REFLEX

## 2018-09-12 LAB — CULTURE, OB URINE

## 2018-09-12 MED ORDER — METRONIDAZOLE 500 MG PO TABS: 500.0000 mg | ORAL_TABLET | Freq: Two times a day (BID) | ORAL | 0 refills | Status: DC

## 2018-09-12 NOTE — Addendum Note (Signed)
Addended by: Verita Schneiders A on: 09/12/2018 12:57 PM   Modules accepted: Orders

## 2018-09-14 DIAGNOSIS — Z136 Encounter for screening for cardiovascular disorders: Secondary | ICD-10-CM | POA: Diagnosis not present

## 2018-09-14 DIAGNOSIS — Z3480 Encounter for supervision of other normal pregnancy, unspecified trimester: Secondary | ICD-10-CM | POA: Diagnosis not present

## 2018-09-19 ENCOUNTER — Encounter: Payer: Self-pay | Admitting: Obstetrics & Gynecology

## 2018-09-21 ENCOUNTER — Encounter: Payer: Self-pay | Admitting: Obstetrics & Gynecology

## 2018-09-24 ENCOUNTER — Encounter: Payer: Self-pay | Admitting: Obstetrics & Gynecology

## 2018-09-24 DIAGNOSIS — Z148 Genetic carrier of other disease: Secondary | ICD-10-CM | POA: Insufficient documentation

## 2018-09-28 ENCOUNTER — Other Ambulatory Visit: Payer: Self-pay

## 2018-09-28 DIAGNOSIS — Z148 Genetic carrier of other disease: Secondary | ICD-10-CM

## 2018-09-28 DIAGNOSIS — O285 Abnormal chromosomal and genetic finding on antenatal screening of mother: Secondary | ICD-10-CM

## 2018-10-08 ENCOUNTER — Ambulatory Visit (INDEPENDENT_AMBULATORY_CARE_PROVIDER_SITE_OTHER): Payer: Medicaid Other | Admitting: Obstetrics & Gynecology

## 2018-10-08 VITALS — BP 125/74 | HR 111

## 2018-10-08 DIAGNOSIS — Z3A14 14 weeks gestation of pregnancy: Secondary | ICD-10-CM

## 2018-10-08 DIAGNOSIS — O99211 Obesity complicating pregnancy, first trimester: Secondary | ICD-10-CM

## 2018-10-08 DIAGNOSIS — Z6791 Unspecified blood type, Rh negative: Secondary | ICD-10-CM

## 2018-10-08 DIAGNOSIS — Z148 Genetic carrier of other disease: Secondary | ICD-10-CM

## 2018-10-08 DIAGNOSIS — O99210 Obesity complicating pregnancy, unspecified trimester: Secondary | ICD-10-CM

## 2018-10-08 DIAGNOSIS — O26891 Other specified pregnancy related conditions, first trimester: Secondary | ICD-10-CM

## 2018-10-08 DIAGNOSIS — O360910 Maternal care for other rhesus isoimmunization, first trimester, not applicable or unspecified: Secondary | ICD-10-CM

## 2018-10-08 DIAGNOSIS — Z3400 Encounter for supervision of normal first pregnancy, unspecified trimester: Secondary | ICD-10-CM

## 2018-10-08 NOTE — Progress Notes (Signed)
S/w pt to prepare for virtual visit, pt states she is unsure if she is feeling fetal movement yet. Pt complains of occasional pelvic pressure.

## 2018-10-08 NOTE — Progress Notes (Signed)
   [facility] VIRTUAL VIDEO VISIT ENCOUNTER NOTE  Provider location: [facility] [place]   I connected with [place] on 10/08/18 at  1:30 PM EDT by WebEx Video Encounter at home and verified that I am speaking with the correct person using two identifiers.   I discussed the limitations, risks, security and privacy concerns of performing an evaluation and management service virtually and the availability of in person appointments. I also discussed with the patient that there may be a patient responsible charge related to this service. The patient expressed understanding and agreed to proceed. Subjective:  Connie Fisher is a 23 y.o. G1P0 at [redacted]w[redacted]d being seen today for ongoing prenatal care.  She is currently monitored for the following issues for this high-risk pregnancy and has Supervision of normal first pregnancy; Maternal morbid obesity, antepartum ([place]); Rh negative state in antepartum period; and Genetic carrier status on their problem list.  Patient reports nausea improved.  Contractions: Not present. Vag. Bleeding: None.   . Denies any leaking of fluid.   The following portions of the patient's history were reviewed and updated as appropriate: allergies, current medications, past family history, past medical history, past social history, past surgical history and problem list.   Objective:   Vitals:   10/08/18 1331  BP: 125/74  Pulse: (!) 111    Fetal Status:           General:  Alert, oriented and cooperative. Patient is in no acute distress.  Respiratory: Normal respiratory effort, no problems with respiration noted  Mental Status: Normal mood and affect. Normal behavior. Normal judgment and thought content.  Rest of physical exam deferred due to type of encounter  Imaging: No results found.  Assessment and Plan:  Pregnancy: G1P0 at [redacted]w[redacted]d 1. Supervision of normal first pregnancy, antepartum Anatomy scan scheduled for 11/06/2018  Pt needs AFP in 3 weeks lab only   2. Rh negative state in antepartum period Needs Rhogam at 28 weeks    3. Maternal morbid obesity, antepartum ([place])   4. Genetic carrier status Genetic counseling appt 11/06/2018  Preterm labor symptoms and general obstetric precautions including but not limited to vaginal bleeding, contractions, leaking of fluid and fetal movement were reviewed in detail with the patient. I discussed the assessment and treatment plan with the patient. The patient was provided an opportunity to ask questions and all were answered. The patient agreed with the plan and demonstrated an understanding of the instructions. The patient was advised to call back or seek an in-person office evaluation/go to [facility] at [facility] for any urgent or concerning symptoms. Please refer to After Visit Summary for other counseling recommendations.   I provided 8 minutes of face-to-face time during this encounter.  Return in about 6 weeks (around 11/19/2018) for Web based.  Future Appointments  Date Time Provider [place]  10/09/2018 11:15 AM [place]  11/06/2018 10:45 AM [facility] NURSE [facility]  11/06/2018 10:45 AM [facility] [place] 2 [facility]  11/06/2018  1:00 PM [place] GENETIC COUNSELING [facility] [place] [facility]    Lavonia Drafts, MD [facility], [place]

## 2018-10-09 ENCOUNTER — Other Ambulatory Visit: Payer: Medicaid Other

## 2018-10-29 ENCOUNTER — Other Ambulatory Visit: Payer: Medicaid Other

## 2018-11-06 ENCOUNTER — Ambulatory Visit (HOSPITAL_COMMUNITY): Payer: Medicaid Other | Attending: Obstetrics and Gynecology

## 2018-11-06 ENCOUNTER — Ambulatory Visit (HOSPITAL_COMMUNITY)
Admission: RE | Admit: 2018-11-06 | Payer: Medicaid Other | Source: Ambulatory Visit | Attending: Obstetrics & Gynecology | Admitting: Obstetrics & Gynecology

## 2018-11-06 ENCOUNTER — Ambulatory Visit (HOSPITAL_COMMUNITY): Payer: Medicaid Other

## 2018-11-13 ENCOUNTER — Other Ambulatory Visit: Payer: Medicaid Other

## 2018-11-13 ENCOUNTER — Other Ambulatory Visit: Payer: Self-pay

## 2018-11-13 DIAGNOSIS — Z3400 Encounter for supervision of normal first pregnancy, unspecified trimester: Secondary | ICD-10-CM

## 2018-11-15 LAB — AFP, SERUM, OPEN SPINA BIFIDA
AFP MoM: 1.49
AFP Value: 61.6 ng/mL
Gest. Age on Collection Date: 20 weeks
Maternal Age At EDD: 23.8 yr
OSBR Risk 1 IN: 2809
Test Results:: NEGATIVE
Weight: 247 [lb_av]

## 2018-11-19 ENCOUNTER — Encounter: Payer: Medicaid Other | Admitting: Advanced Practice Midwife

## 2018-11-20 ENCOUNTER — Other Ambulatory Visit (HOSPITAL_COMMUNITY): Payer: Self-pay | Admitting: *Deleted

## 2018-11-20 ENCOUNTER — Other Ambulatory Visit: Payer: Self-pay | Admitting: Obstetrics & Gynecology

## 2018-11-20 ENCOUNTER — Other Ambulatory Visit: Payer: Self-pay

## 2018-11-20 ENCOUNTER — Ambulatory Visit (HOSPITAL_BASED_OUTPATIENT_CLINIC_OR_DEPARTMENT_OTHER): Payer: Medicaid Other | Admitting: Genetic Counselor

## 2018-11-20 ENCOUNTER — Ambulatory Visit (HOSPITAL_COMMUNITY)
Admission: RE | Admit: 2018-11-20 | Discharge: 2018-11-20 | Disposition: A | Discharge status: Home / Self Care | Payer: Medicaid Other | Source: Ambulatory Visit | Attending: Obstetrics and Gynecology | Admitting: Obstetrics and Gynecology

## 2018-11-20 ENCOUNTER — Encounter (HOSPITAL_COMMUNITY): Payer: Self-pay

## 2018-11-20 ENCOUNTER — Ambulatory Visit (HOSPITAL_COMMUNITY): Payer: Self-pay | Admitting: Genetic Counselor

## 2018-11-20 ENCOUNTER — Ambulatory Visit (HOSPITAL_COMMUNITY): Payer: Medicaid Other | Admitting: *Deleted

## 2018-11-20 VITALS — BP 122/84 | HR 108 | Temp 98.6°F

## 2018-11-20 DIAGNOSIS — O360190 Maternal care for anti-D [Rh] antibodies, unspecified trimester, not applicable or unspecified: Secondary | ICD-10-CM

## 2018-11-20 DIAGNOSIS — Z315 Encounter for genetic counseling: Secondary | ICD-10-CM

## 2018-11-20 DIAGNOSIS — O0990 Supervision of high risk pregnancy, unspecified, unspecified trimester: Secondary | ICD-10-CM | POA: Diagnosis not present

## 2018-11-20 DIAGNOSIS — Z3689 Encounter for other specified antenatal screening: Secondary | ICD-10-CM

## 2018-11-20 DIAGNOSIS — E71310 Long chain/very long chain acyl CoA dehydrogenase deficiency: Secondary | ICD-10-CM | POA: Diagnosis not present

## 2018-11-20 DIAGNOSIS — O99212 Obesity complicating pregnancy, second trimester: Secondary | ICD-10-CM

## 2018-11-20 DIAGNOSIS — Z3A21 21 weeks gestation of pregnancy: Secondary | ICD-10-CM

## 2018-11-20 DIAGNOSIS — Z3402 Encounter for supervision of normal first pregnancy, second trimester: Secondary | ICD-10-CM | POA: Diagnosis not present

## 2018-11-20 DIAGNOSIS — O99210 Obesity complicating pregnancy, unspecified trimester: Secondary | ICD-10-CM | POA: Insufficient documentation

## 2018-11-20 DIAGNOSIS — Z148 Genetic carrier of other disease: Secondary | ICD-10-CM

## 2018-11-20 DIAGNOSIS — Z3401 Encounter for supervision of normal first pregnancy, first trimester: Secondary | ICD-10-CM

## 2018-11-20 DIAGNOSIS — O360120 Maternal care for anti-D [Rh] antibodies, second trimester, not applicable or unspecified: Secondary | ICD-10-CM | POA: Diagnosis not present

## 2018-11-20 NOTE — Progress Notes (Signed)
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 Connie Fisher came to her appointment alone due to COVID-19 visitor restrictions.   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 female. Her current pregnancy has completed [redacted]w[redacted]d (Estimated Date of Delivery: 04/02/19).  Connie Fisher denied exposure to environmental toxins or chemical agents. She denied the use of alcohol, tobacco or street drugs. She reported taking prenatal vitamins. She denied significant viral illnesses, fevers, and bleeding during the course of her pregnancy. Her medical and surgical histories were noncontributory.  Family History  A three generation pedigree was drafted and reviewed. The family history is remarkable for the following:  - Connie Fisher was born with a "hole in her heart" that did not require surgical correction. The exact etiology of this congenital heart defect (CHD) is unknown and records are not available. Her remaining medical and developmental history is noncontributory. There are multifactorial causes for isolated CHDs, including environmental and genetic factors. As such, the recurrence risk for first degree relatives is elevated over the general population incidence of 1/200 (0.5%). We discussed that without knowing the exact etiology, the risk for any CHD in a child of an affected mother is approximately 5-6%. A fetal echocardiogram to assess for CHDs in the current pregnancy is recommended.  -Connie Fisher's mother has a history of blood clots. The remaining family history is negative for blood clots or bleeding disorders. Thrombophilias, or conditions in which there is an increased tendency to  develop a blood clot, may be inherited or acquired. Individuals with inherited clotting disorders have an increased risk of developing blood clots than the general population. If Connie Fisher mother has an inherited clotting disorder, Connie Fisher may be at increased risk to develop blood clots herself. However, without information about the etiology of her mother's blood clots, risk assessment is limited.  - Connie Fisher has a maternal half brother whose son has developmental delays, blindness in one eye, and an inguinal hernia. She is not sure if this individual has had genetic testing. Without knowing the etiology of this individual's, risk assessment is limited.  - Connie Fisher father has a brother who had cancer diagnosed in his 79s. Connie Fisher is not sure what type of cancer this individual had. There is no other family history of cancer. Though most cancers are thought to be sporadic or due to environmental factors, some families appear to have a predisposition to cancers. When considering a family history of cancer, we look for common types of cancer in multiple family members occurring at younger than typical ages. However, certain cancers are more likely to occur in younger individuals than others. Without additional information, risk assessment for an inherited cancer syndrome in the family is limited.  The remaining family histories were reviewed and found to be noncontributory for birth defects, intellectual disability, recurrent pregnancy loss, and known genetic conditions. Connie Fisher had limited information about her partner's family history; thus, risk assessment was limited.  The patient's ethnicity is [place] and [place] American. The father of the pregnancy's ethnicity is African American. Ashkenazi Jewish ancestry and consanguinity were denied. Pedigree will be scanned under Media.  Discussion  Connie Fisher had [id number] carrier screening performed through [place]. 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 MCAD deficiency is a condition that prevents the body from converting certain fats to energy, particularly during periods of fasting. MCAD deficiency is characterized by symptoms such as vomiting, lethargy, and hypoglycemia, most often during infancy or early childhood. Individuals affected by MCAD deficiency are at risk for serious complications including seizures, respiratory difficulties, liver problems, brain damage, coma, and sudden death. Symptoms of MCAD deficiency can be triggered by periods of fasting or illness. To avoid serious complications, infants with MCAD deficiency require frequent feedings with adequate calories from complex carbohydrates to maintain their blood sugar levels and avoid hypoglycemia.   MCAD deficiency is caused by mutations in the ACADM gene. This gene provides instructions for making an enzyme called medium-chain acyl-CoA dehydrogenase (MCAD). This enzyme is required to metabolize a group of fats found in foods and the body's fat tissues called medium-chain fatty acids. Pathogenic variants in the ACADM gene cause a deficiency of the MCAD enzyme, preventing medium chain fatty acids from being metabolized properly. As a result, these fats are not converted to energy, which can lead to the symptoms of this disorder. Additionally, medium-chain fatty acids may build up in tissues and damage the liber and brain. MCAD deficiency is inherited in an autosomal recessive pattern, meaning the current fetus is only at risk for MCAD deficiency if Connie Fisher partner is also a carrier for the condition. Based on his ethnicity, Connie Fisher partner has a 1 in 172 chance of being a carrier for MCAD deficiency. IfMs. Connie Fisher's partner is also a carrier,the risk forMCAD deficiencyin the pregnancy would be 1 in 4 (25%). However, the condition is more common in people of northern European  ancestry than in other ethnic groups.  Connie Fisher carrier screening was negative for the other 13 conditions screened. Thus, her risk to be a carrier for these additional conditions (listed separately in the laboratory report) has been reduced but not eliminated. We discussed that carrier testing for MCAD deficiency is recommended for Connie Fisher partner. Connie Fisher indicated that she is interested in pursuing partner carrier screening.  We also reviewed that Connie Fisher had [employer] NIPS through Connie Fisher that was low-risk for fetal aneuploidies. We reviewed that these results showed a less than 1 in 10,000 risk for trisomies 21, 18 and 13, and monosomy X (Turner syndrome).  In addition, the risk for triploidy and sex chromosome trisomies (47,XXX and 47,XXY) was also low. Connie Fisher elected to have cffDNA analysis for 22q11 deletion syndrome, which was also low risk (1 in 2900). We reviewed that while this testing identifies > 99% of pregnancies with trisomy 70, trisomy 76, sex chromosome trisomies (47,XXX and 47,XXY), and triploidy, it is NOT diagnostic. A positive test result requires confirmation by CVS or amniocentesis, and a negative test result does not rule out a fetal chromosome abnormality. She also understands that this testing does not identify all genetic conditions.  A complete ultrasound was performed today prior to our visit. The ultrasound report will be sent under separate cover. There were no visualized fetal anomalies or markers suggestive of aneuploidy.  Connie Fisher was also counseled regarding diagnostic testing via amniocentesis. We discussed the technical aspects of the procedure and quoted up to a 1 in 500 (0.2%) risk for spontaneous pregnancy loss or other adverse pregnancy outcomes as a result of amniocentesis. Cultured cells from an amniocentesis sample allow for the visualization of a fetal karyotype, which can detect >99% of chromosomal aberrations.  Chromosomal microarray  can also be performed to identify smaller deletions or duplications of fetal chromosomal material. Amniocentesis could also be performed to assess whether the baby is affected by MCAD deficiency. After careful consideration, Connie Fisher declined amniocentesis at this time. She understands that amniocentesis is available at any point after 16 weeks of pregnancy and that she may opt to undergo the procedure at a later date should she change her mind.  Connie Fisher desires carrier screening for MCAD deficiency for her partner, Connie Fisher; however, she wanted to talk to him first to determine if he has health insurance. Connie Fisher was provided with my business card and instructed to call me when her and her partner have discussed testing. If he does not have health insurance, we can explore the possibility of free carrier screening through the laboratory [facility] by applying to their Patient Assistance Program. Once I hear from Connie Fisher, I will coordinate sample collection and facilitate the testing process.  I counseled Connie Fisher regarding the above risks and available options. The approximate face-to-face time with the genetic counselor was 25 minutes.  In summary:  Discussed carrier screening results and options for follow-up testing  Carrier for MCAD deficiency  Desires partner carrier screening. Will call me to facilitate sample collection and testing process once she discusses testing with her partner.  Reviewed low-risk NIPS results  Reduction in risk for Down syndrome,trisomy 18,trisomy 13, sex chromosome aneuploidies, and 22q11 deletion syndrome   Reviewed results of ultrasound  No fetal anomalies or markers seen  Reduction in risk for fetal aneuploidy  Offered additional testing and screening  Declined amniocentesis  Reviewed family history concerns  Recommend fetal echocardiogram due to Ms. Dessa PhiKearney's history of CHD   Gershon CraneHaley E Tavoris Brisk, MS Genetic Counselor

## 2018-11-21 ENCOUNTER — Telehealth: Payer: Self-pay | Admitting: Advanced Practice Midwife

## 2018-12-04 ENCOUNTER — Other Ambulatory Visit: Payer: Self-pay

## 2018-12-04 ENCOUNTER — Ambulatory Visit (INDEPENDENT_AMBULATORY_CARE_PROVIDER_SITE_OTHER): Payer: Medicaid Other | Admitting: Obstetrics

## 2018-12-04 ENCOUNTER — Encounter: Payer: Self-pay | Admitting: Obstetrics

## 2018-12-04 VITALS — BP 124/84 | HR 89

## 2018-12-04 DIAGNOSIS — O285 Abnormal chromosomal and genetic finding on antenatal screening of mother: Secondary | ICD-10-CM

## 2018-12-04 DIAGNOSIS — Z3400 Encounter for supervision of normal first pregnancy, unspecified trimester: Secondary | ICD-10-CM

## 2018-12-04 DIAGNOSIS — O26892 Other specified pregnancy related conditions, second trimester: Secondary | ICD-10-CM

## 2018-12-04 DIAGNOSIS — Z3A23 23 weeks gestation of pregnancy: Secondary | ICD-10-CM

## 2018-12-04 DIAGNOSIS — Z6791 Unspecified blood type, Rh negative: Secondary | ICD-10-CM

## 2018-12-04 DIAGNOSIS — O99210 Obesity complicating pregnancy, unspecified trimester: Secondary | ICD-10-CM

## 2018-12-04 DIAGNOSIS — O99212 Obesity complicating pregnancy, second trimester: Secondary | ICD-10-CM

## 2018-12-04 NOTE — Progress Notes (Signed)
TELEHEALTH OBSTETRICS PRENATAL VIRTUAL VIDEO VISIT ENCOUNTER NOTE  [facility]   I connected with Connie Fisher on 12/04/18 at  2:30 PM EDT by WebEx OB  Video Encounter at home and verified that I am speaking with the correct person using two identifiers.   I discussed the limitations, risks, security and privacy concerns of performing an evaluation and management service virtually and the availability of in person appointments. I also discussed with the patient that there may be a patient responsible charge related to this service. The patient expressed understanding and agreed to proceed. Subjective:  Connie Fisher is a 23 y.o. G1P0 at 6242w0d being seen today for ongoing prenatal care.  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  Patient reports no complaints.  Contractions: Not present. Vag. Bleeding: None.  Movement: Present. Denies any leaking of fluid.   The following portions of the patient's history were reviewed and updated as appropriate: allergies, current medications, past family history, past medical history, past social history, past surgical history and problem list.   Objective:   Vitals:   12/04/18 1433  BP: 124/84  Pulse: 89    Fetal Status:     Movement: Present     General:  Alert, oriented and cooperative. Patient is in no acute distress.  Respiratory: Normal respiratory effort, no problems with respiration noted  Mental Status: Normal mood and affect. Normal behavior. Normal judgment and thought content.  Rest of physical exam deferred due to type of encounter  Imaging: [place] Mfm Ob Transvaginal  Result Date: 11/20/2018 ----------------------------------------------------------------------  OBSTETRICS  REPORT                       (Signed Final 11/20/2018 11:11 am) ---------------------------------------------------------------------- Patient Info  ID #:       [id number]                          D.O.B.:  10/06/1995 (23 yrs)  Name:       Connie Fisher                  Visit Date: 11/20/2018 09:14 am ---------------------------------------------------------------------- Performed By  Performed By:     Emeline DarlingKasie E Kiser BS,      Ref. Address:     [address]                    [address]                                                             [address]                                                             [place], [place]  [place]  Attending:        Johnell Comings MD         [facility]  Referred By:      Osborne Oman MD ---------------------------------------------------------------------- Orders   #  Description                          Code         Ordered By   1  [place] MFM OB DETAIL +14 [place]              76811.01     Verita Schneiders   2  [place] MFM OB TRANSVAGINAL               09470.9      UGONNA ANYANWU  ----------------------------------------------------------------------   #  Order #                    Accession #                 Episode #   1  [id number]                  [id number]                  [id number]   2  [id number]                  [id number]                  [id number]  ---------------------------------------------------------------------- Indications   Obesity complicating pregnancy, second         O99.212   trimester   Encounter for antenatal screening for          Z36.3   malformations   Genetic carrier (specify)                      Z14.8   Negative AFP   Low Risk NIPS   Rh negative state in antepartum                O36.0190   Encounter for cervical length                  Z36.86   [redacted] weeks gestation of pregnancy                Z3A.21   ---------------------------------------------------------------------- Vital Signs  Weight (lb): 247                               Height:        5'3"  BMI:         43.75 ---------------------------------------------------------------------- Fetal Evaluation  Num Of Fetuses:         1  Fetal Heart Rate(bpm):  158  Cardiac Activity:       Observed  Presentation:  Cephalic  Placenta:               Anterior  P. Cord Insertion:      Visualized  Amniotic Fluid  AFI FV:      Within normal limits                              Largest Pocket(cm)                              5.3 ---------------------------------------------------------------------- Biometry  BPD:      50.4  mm     G. Age:  21w 2d         60  %    CI:        77.22   %    70 - 86                                                          FL/HC:      20.4   %    15.9 - 20.3  HC:      181.6  mm     G. Age:  20w 4d         22  %    HC/AC:      1.05        1.06 - 1.25  AC:      172.9  mm     G. Age:  22w 2d         81  %    FL/BPD:     73.4   %  FL:         37  mm     G. Age:  21w 5d         68  %    FL/AC:      21.4   %    20 - 24  HUM:      36.4  mm     G. Age:  22w 5d         88  %  NFT:       4.1  mm  Est. FW:     456  gm           1 lb     87  % ---------------------------------------------------------------------- OB History  Gravidity:    1 ---------------------------------------------------------------------- Gestational Age  LMP:           21w 0d        Date:  06/26/18                 EDD:   04/02/19  U/S Today:     21w 3d                                        EDD:   03/30/19  Best:          [place] 0d     Det. By:  LMP  (06/26/18)          EDD:   04/02/19 ---------------------------------------------------------------------- Anatomy  Cranium:  Appears normal         LVOT:                   Not well visualized  Cavum:                 Appears normal         Aortic Arch:            Not well visualized  Ventricles:            Appears normal          Ductal Arch:            Not well visualized  Choroid Plexus:        Appears normal         Diaphragm:              Appears normal  Cerebellum:            Appears normal         Stomach:                Appears normal, left                                                                        sided  Posterior Fossa:       Appears normal         Abdomen:                Appears normal  Nuchal Fold:           Appears normal         Abdominal Wall:         Appears nml (cord                                                                        insert, abd wall)  Face:                  Appears normal         Cord Vessels:           Appears normal (3                         (orbits and profile)                           vessel cord)  Lips:                  Not well visualized    Kidneys:                Appear normal  Palate:                Appears normal         Bladder:                Appears normal  Thoracic:              Appears normal         Spine:                  Not well visualized  Heart:                 Appears normal         Upper Extremities:      Appears normal                         (4CH, axis, and                         situs)  RVOT:                  Not well visualized    Lower Extremities:      Appears normal  Other:  Technically difficult due to maternal habitus and fetal position. ---------------------------------------------------------------------- Cervix Uterus Adnexa  Cervix  Length:              3  cm.  Measured transvaginally. ---------------------------------------------------------------------- Comments  This patient was seen for a detailed fetal anatomy scan due  to maternal obesity.  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  She is meeting with our genetic counselor  following today's ultrasound exam to discuss the significance  of being a carrier for this enzyme deficiency.  She denies any  other significant past medical  history and denies any  problems in her current pregnancy.  She had a cell free DNA test earlier in her pregnancy which  indicated a low risk for trisomy 3, 81, and 13.  She was informed that the fetal growth and amniotic fluid  level were appropriate for her gestational age.  There were no obvious fetal anomalies noted on today's  ultrasound exam.  However, today's exam was limited due to  maternal body habitus.  The patient was informed that anomalies may be missed due  to technical limitations. If the fetus is in a suboptimal position  or maternal habitus is increased, visualization of the fetus in  the maternal uterus may be impaired.  A transvaginal ultrasound performed today to evaluate her  cervical length showed that her cervix is about 3 cm long  without any signs of funneling.  The patient was reassured  that her risk of a preterm birth is low at this time based on  today's normal cervical length measurement.  A follow-up exam was scheduled in 4 weeks to obtain better  views of the fetal anatomy. ----------------------------------------------------------------------                   Ma Rings, MD Electronically Signed Final Report   11/20/2018 11:11 am ----------------------------------------------------------------------  [place] Mfm Ob Detail +14 Wk  Result Date: 11/20/2018 ----------------------------------------------------------------------  OBSTETRICS REPORT                       (Signed Final 11/20/2018 11:11 am) ---------------------------------------------------------------------- Patient Info  ID #:       [id number]                          D.O.B.:  Aug 01, 1995 (23 yrs)  Name:       Connie Fisher  Visit Date: 11/20/2018 09:14 am ---------------------------------------------------------------------- Performed By  Performed By:     Emeline Darling BS,      Ref. Address:     [address]                                                              [place], [place]                                                             [address]  Attending:        Ma Rings MD         [facility]  Referred By:      Tereso Newcomer MD ---------------------------------------------------------------------- Orders   #  Description                          Code         Ordered By   1  [place] MFM OB DETAIL +14 WK              76811.01     Jaynie Collins   2  [place] MFM OB TRANSVAGINAL               60454.0      Jaynie Collins  ----------------------------------------------------------------------   #  Order #                    Accession #                 Episode #   1  [id number]                  [id number]                  [id number]   2  [id number]                  [id number]                  [id number]  ---------------------------------------------------------------------- Indications  Obesity complicating pregnancy, second         O99.212   trimester   Encounter for antenatal screening for          Z36.3   malformations   Genetic carrier (specify)                      Z14.8   Negative AFP   Low Risk NIPS   Rh negative state in antepartum                O36.0190   Encounter for cervical length                  Z36.86   [redacted] weeks gestation of pregnancy                Z3A.21  ---------------------------------------------------------------------- Vital Signs  Weight (lb): 247                               Height:        5'3"  BMI:         43.75 ---------------------------------------------------------------------- Fetal Evaluation  Num Of Fetuses:         1  Fetal Heart Rate(bpm):  158  Cardiac Activity:       Observed  Presentation:           Cephalic  Placenta:               Anterior  P. Cord Insertion:      Visualized  Amniotic Fluid  AFI FV:      Within normal limits                              Largest Pocket(cm)                               5.3 ---------------------------------------------------------------------- Biometry  BPD:      50.4  mm     G. Age:  21w 2d         60  %    CI:        77.22   %    70 - 86                                                          FL/HC:      20.4   %    15.9 - 20.3  HC:      181.6  mm     G. Age:  20w 4d         22  %    HC/AC:      1.05        1.06 - 1.25  AC:      172.9  mm     G. Age:  22w 2d         81  %    FL/BPD:     73.4   %  FL:         37  mm     G. Age:  21w 5d         68  %  FL/AC:      21.4   %    20 - 24  HUM:      36.4  mm     G. Age:  22w 5d         88  %  NFT:       4.1  mm  Est. FW:     456  gm           1 lb     87  % ---------------------------------------------------------------------- OB History  Gravidity:    1 ---------------------------------------------------------------------- Gestational Age  LMP:           21w 0d        Date:  06/26/18                 EDD:   04/02/19  U/S Today:     21w 3d                                        EDD:   03/30/19  Best:          [place] 0d     Det. By:  LMP  (06/26/18)          EDD:   04/02/19 ---------------------------------------------------------------------- Anatomy  Cranium:               Appears normal         LVOT:                   Not well visualized  Cavum:                 Appears normal         Aortic Arch:            Not well visualized  Ventricles:            Appears normal         Ductal Arch:            Not well visualized  Choroid Plexus:        Appears normal         Diaphragm:              Appears normal  Cerebellum:            Appears normal         Stomach:                Appears normal, left                                                                        sided  Posterior Fossa:       Appears normal         Abdomen:                Appears normal  Nuchal Fold:           Appears normal         Abdominal Wall:         Appears nml (cord  insert,  abd wall)  Face:                  Appears normal         Cord Vessels:           Appears normal (3                         (orbits and profile)                           vessel cord)  Lips:                  Not well visualized    Kidneys:                Appear normal  Palate:                Appears normal         Bladder:                Appears normal  Thoracic:              Appears normal         Spine:                  Not well visualized  Heart:                 Appears normal         Upper Extremities:      Appears normal                         (4CH, axis, and                         situs)  RVOT:                  Not well visualized    Lower Extremities:      Appears normal  Other:  Technically difficult due to maternal habitus and fetal position. ---------------------------------------------------------------------- Cervix Uterus Adnexa  Cervix  Length:              3  cm.  Measured transvaginally. ---------------------------------------------------------------------- Comments  This patient was seen for a detailed fetal anatomy scan due  to maternal obesity.  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  She is meeting with our genetic counselor  following today's ultrasound exam to discuss the significance  of being a carrier for this enzyme deficiency.  She denies any  other significant past medical history and denies any  problems in her current pregnancy.  She had a cell free DNA test earlier in her pregnancy which  indicated a low risk for trisomy 71, 53, and 13.  She was informed that the fetal growth and amniotic fluid  level were appropriate for her gestational age.  There were no obvious fetal anomalies noted on today's  ultrasound exam.  However, today's exam was limited due to  maternal body habitus.  The patient was informed that anomalies may be missed due  to technical limitations. If the fetus is in a suboptimal position  or maternal  habitus is increased, visualization of the fetus in  the maternal uterus may be impaired.  A transvaginal ultrasound performed today to evaluate her  cervical  length showed that her cervix is about 3 cm long  without any signs of funneling.  The patient was reassured  that her risk of a preterm birth is low at this time based on  today's normal cervical length measurement.  A follow-up exam was scheduled in 4 weeks to obtain better  views of the fetal anatomy. ----------------------------------------------------------------------                   Ma Rings, MD Electronically Signed Final Report   11/20/2018 11:11 am ----------------------------------------------------------------------   Assessment and Plan:  Pregnancy: G1P0 at [redacted]w[redacted]d  1. Supervision of normal first pregnancy, antepartum  2. 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 Rh negative state in antepartum period - rhogam postpartum   4. Obesity affecting pregnancy, antepartum   There are no diagnoses linked to this encounter. Preterm labor symptoms and general obstetric precautions including but not limited to vaginal bleeding, contractions, leaking of fluid and fetal movement were reviewed in detail with the patient. I discussed the assessment and treatment plan with the patient. The patient was provided an opportunity to ask questions and all were answered. The patient agreed with the plan and demonstrated an understanding of the instructions. The patient was advised to call back or seek an in-person office evaluation/go to [facility] at [facility] for any urgent or concerning symptoms. Please refer to After Visit Summary for other counseling recommendations.   I provided 10 minutes of face-to-face time during  this encounter.  Return in about 4 weeks (around 01/01/2019) for ROB, 2 hour OGTT.  Future Appointments  Date Time Provider Department Center  12/18/2018  1:15 PM [facility] NURSE [facility]  12/18/2018  1:15 PM [facility] [place] 4 WH-MFCUS M[facility]-US    Coral Ceo, MD [facility], [facility] 12-04-2018

## 2018-12-04 NOTE — Progress Notes (Signed)
Pt is on the phone preparing for virtual visit with provider. [redacted]w[redacted]d.

## 2018-12-13 ENCOUNTER — Telehealth (HOSPITAL_COMMUNITY): Payer: Self-pay | Admitting: Genetic Counselor

## 2018-12-13 NOTE — Telephone Encounter (Signed)
Attempted to call Ms. Mortell to check in and see if her partner is interested in undergoing carrier screening for MCAD deficiency as I have not yet heard back from her. However, I received a message that the call could not be completed at this time and was unable to leave a voicemail.  Buelah Manis, MS Genetic Counselor

## 2018-12-18 ENCOUNTER — Ambulatory Visit (HOSPITAL_COMMUNITY)
Admission: RE | Admit: 2018-12-18 | Discharge: 2018-12-18 | Disposition: A | Discharge status: Home / Self Care | Payer: Medicaid Other | Source: Ambulatory Visit | Attending: Obstetrics and Gynecology | Admitting: Obstetrics and Gynecology

## 2018-12-18 ENCOUNTER — Encounter (HOSPITAL_COMMUNITY): Payer: Self-pay

## 2018-12-18 ENCOUNTER — Other Ambulatory Visit: Payer: Self-pay

## 2018-12-18 ENCOUNTER — Other Ambulatory Visit (HOSPITAL_COMMUNITY): Payer: Self-pay | Admitting: *Deleted

## 2018-12-18 ENCOUNTER — Ambulatory Visit (HOSPITAL_COMMUNITY): Payer: Medicaid Other | Admitting: *Deleted

## 2018-12-18 DIAGNOSIS — Z3689 Encounter for other specified antenatal screening: Secondary | ICD-10-CM | POA: Insufficient documentation

## 2018-12-18 DIAGNOSIS — O360120 Maternal care for anti-D [Rh] antibodies, second trimester, not applicable or unspecified: Secondary | ICD-10-CM | POA: Diagnosis not present

## 2018-12-18 DIAGNOSIS — O99212 Obesity complicating pregnancy, second trimester: Secondary | ICD-10-CM | POA: Diagnosis not present

## 2018-12-18 DIAGNOSIS — Z148 Genetic carrier of other disease: Secondary | ICD-10-CM | POA: Diagnosis not present

## 2018-12-18 DIAGNOSIS — Z362 Encounter for other antenatal screening follow-up: Secondary | ICD-10-CM | POA: Diagnosis not present

## 2018-12-18 DIAGNOSIS — Z3A25 25 weeks gestation of pregnancy: Secondary | ICD-10-CM | POA: Diagnosis not present

## 2018-12-18 DIAGNOSIS — Z3402 Encounter for supervision of normal first pregnancy, second trimester: Secondary | ICD-10-CM | POA: Diagnosis not present

## 2018-12-18 DIAGNOSIS — O99210 Obesity complicating pregnancy, unspecified trimester: Secondary | ICD-10-CM

## 2019-01-01 ENCOUNTER — Encounter: Payer: Self-pay | Admitting: Obstetrics and Gynecology

## 2019-01-01 ENCOUNTER — Ambulatory Visit (INDEPENDENT_AMBULATORY_CARE_PROVIDER_SITE_OTHER): Payer: Medicaid Other | Admitting: Obstetrics and Gynecology

## 2019-01-01 ENCOUNTER — Other Ambulatory Visit: Payer: Self-pay

## 2019-01-01 ENCOUNTER — Other Ambulatory Visit: Payer: Medicaid Other

## 2019-01-01 VITALS — BP 114/76 | HR 103 | Wt 282.0 lb

## 2019-01-01 DIAGNOSIS — O360120 Maternal care for anti-D [Rh] antibodies, second trimester, not applicable or unspecified: Secondary | ICD-10-CM | POA: Diagnosis not present

## 2019-01-01 DIAGNOSIS — Z3400 Encounter for supervision of normal first pregnancy, unspecified trimester: Secondary | ICD-10-CM

## 2019-01-01 DIAGNOSIS — O26892 Other specified pregnancy related conditions, second trimester: Secondary | ICD-10-CM

## 2019-01-01 DIAGNOSIS — Z3A27 27 weeks gestation of pregnancy: Secondary | ICD-10-CM | POA: Diagnosis not present

## 2019-01-01 DIAGNOSIS — O26899 Other specified pregnancy related conditions, unspecified trimester: Secondary | ICD-10-CM

## 2019-01-01 DIAGNOSIS — Z23 Encounter for immunization: Secondary | ICD-10-CM

## 2019-01-01 DIAGNOSIS — Z148 Genetic carrier of other disease: Secondary | ICD-10-CM

## 2019-01-01 DIAGNOSIS — O99212 Obesity complicating pregnancy, second trimester: Secondary | ICD-10-CM

## 2019-01-01 DIAGNOSIS — Z6791 Unspecified blood type, Rh negative: Secondary | ICD-10-CM

## 2019-01-01 MED ORDER — RHO D IMMUNE GLOBULIN 1500 UNIT/2ML IJ SOSY
300.0000 ug | PREFILLED_SYRINGE | Freq: Once | INTRAMUSCULAR | Status: AC
  Administered 2019-01-01: 10:00:00 300 ug via INTRAMUSCULAR

## 2019-01-01 NOTE — Addendum Note (Signed)
Addended by: Courtney Heys on: 01/01/2019 10:04 AM   Modules accepted: Orders

## 2019-01-01 NOTE — Progress Notes (Addendum)
ROB 2 hr GTT T-DAP Declined  Flu Vaccine Today  Rhogam per notes give at 28 wks. Per provider give Rhogam today   CC: None

## 2019-01-01 NOTE — Patient Instructions (Signed)
Third Trimester of Pregnancy The third trimester is from week 28 through week 40 (months 7 through 9). The third trimester is a time when the unborn baby (fetus) is growing rapidly. At the end of the ninth month, the fetus is about 20 inches in length and weighs 6-10 pounds. Body changes during your third trimester Your body will continue to go through many changes during pregnancy. The changes vary from woman to woman. During the third trimester:  Your weight will continue to increase. You can expect to gain 25-35 pounds (11-16 kg) by the end of the pregnancy.  You may begin to get stretch marks on your hips, abdomen, and breasts.  You may urinate more often because the fetus is moving lower into your pelvis and pressing on your bladder.  You may develop or continue to have heartburn. This is caused by increased hormones that slow down muscles in the digestive tract.  You may develop or continue to have constipation because increased hormones slow digestion and cause the muscles that push waste through your intestines to relax.  You may develop hemorrhoids. These are swollen veins (varicose veins) in the rectum that can itch or be painful.  You may develop swollen, bulging veins (varicose veins) in your legs.  You may have increased body aches in the pelvis, back, or thighs. This is due to weight gain and increased hormones that are relaxing your joints.  You may have changes in your hair. These can include thickening of your hair, rapid growth, and changes in texture. Some women also have hair loss during or after pregnancy, or hair that feels dry or thin. Your hair will most likely return to normal after your baby is born.  Your breasts will continue to grow and they will continue to become tender. A yellow fluid (colostrum) may leak from your breasts. This is the first milk you are producing for your baby.  Your belly button may stick out.  You may notice more swelling in your hands,  face, or ankles.  You may have increased tingling or numbness in your hands, arms, and legs. The skin on your belly may also feel numb.  You may feel short of breath because of your expanding uterus.  You may have more problems sleeping. This can be caused by the size of your belly, increased need to urinate, and an increase in your body's metabolism.  You may notice the fetus "dropping," or moving lower in your abdomen (lightening).  You may have increased vaginal discharge.  You may notice your joints feel loose and you may have pain around your pelvic bone. What to expect at prenatal visits You will have prenatal exams every 2 weeks until week 36. Then you will have weekly prenatal exams. During a routine prenatal visit:  You will be weighed to make sure you and the baby are growing normally.  Your blood pressure will be taken.  Your abdomen will be measured to track your baby's growth.  The fetal heartbeat will be listened to.  Any test results from the previous visit will be discussed.  You may have a cervical check near your due date to see if your cervix has softened or thinned (effaced).  You will be tested for Group B streptococcus. This happens between 35 and 37 weeks. Your health care provider may ask you:  What your birth plan is.  How you are feeling.  If you are feeling the baby move.  If you have had any abnormal   symptoms, such as leaking fluid, bleeding, severe headaches, or abdominal cramping.  If you are using any tobacco products, including cigarettes, chewing tobacco, and electronic cigarettes.  If you have any questions. Other tests or screenings that may be performed during your third trimester include:  Blood tests that check for low iron levels (anemia).  Fetal testing to check the health, activity level, and growth of the fetus. Testing is done if you have certain medical conditions or if there are problems during the pregnancy.  Nonstress test  (NST). This test checks the health of your baby to make sure there are no signs of problems, such as the baby not getting enough oxygen. During this test, a belt is placed around your belly. The baby is made to move, and its heart rate is monitored during movement. What is false labor? False labor is a condition in which you feel small, irregular tightenings of the muscles in the womb (contractions) that usually go away with rest, changing position, or drinking water. These are called Braxton Hicks contractions. Contractions may last for hours, days, or even weeks before true labor sets in. If contractions come at regular intervals, become more frequent, increase in intensity, or become painful, you should see your health care provider. What are the signs of labor?  Abdominal cramps.  Regular contractions that start at 10 minutes apart and become stronger and more frequent with time.  Contractions that start on the top of the uterus and spread down to the lower abdomen and back.  Increased pelvic pressure and dull back pain.  A watery or bloody mucus discharge that comes from the vagina.  Leaking of amniotic fluid. This is also known as your "water breaking." It could be a slow trickle or a gush. Let your health care provider know if it has a color or strange odor. If you have any of these signs, call your health care provider right away, even if it is before your due date. Follow these instructions at home: Medicines  Follow your health care provider's instructions regarding medicine use. Specific medicines may be either safe or unsafe to take during pregnancy.  Take a prenatal vitamin that contains at least 600 micrograms (mcg) of folic acid.  If you develop constipation, try taking a stool softener if your health care provider approves. Eating and drinking   Eat a balanced diet that includes fresh fruits and vegetables, whole grains, good sources of protein such as meat, eggs, or tofu,  and low-fat dairy. Your health care provider will help you determine the amount of weight gain that is right for you.  Avoid raw meat and uncooked cheese. These carry germs that can cause birth defects in the baby.  If you have low calcium intake from food, talk to your health care provider about whether you should take a daily calcium supplement.  Eat four or five small meals rather than three large meals a day.  Limit foods that are high in fat and processed sugars, such as fried and sweet foods.  To prevent constipation: ? Drink enough fluid to keep your urine clear or pale yellow. ? Eat foods that are high in fiber, such as fresh fruits and vegetables, whole grains, and beans. Activity  Exercise only as directed by your health care provider. Most women can continue their usual exercise routine during pregnancy. Try to exercise for 30 minutes at least 5 days a week. Stop exercising if you experience uterine contractions.  Avoid heavy lifting.  Do   not exercise in extreme heat or humidity, or at high altitudes.  Wear low-heel, comfortable shoes.  Practice good posture.  You may continue to have sex unless your health care provider tells you otherwise. Relieving pain and discomfort  Take frequent breaks and rest with your legs elevated if you have leg cramps or low back pain.  Take warm sitz baths to soothe any pain or discomfort caused by hemorrhoids. Use hemorrhoid cream if your health care provider approves.  Wear a good support bra to prevent discomfort from breast tenderness.  If you develop varicose veins: ? Wear support pantyhose or compression stockings as told by your healthcare provider. ? Elevate your feet for 15 minutes, 3-4 times a day. Prenatal care  Write down your questions. Take them to your prenatal visits.  Keep all your prenatal visits as told by your health care provider. This is important. Safety  Wear your seat belt at all times when driving.  Make  a list of emergency phone numbers, including numbers for family, friends, the hospital, and police and fire departments. General instructions  Avoid cat litter boxes and soil used by cats. These carry germs that can cause birth defects in the baby. If you have a cat, ask someone to clean the litter box for you.  Do not travel far distances unless it is absolutely necessary and only with the approval of your health care provider.  Do not use hot tubs, steam rooms, or saunas.  Do not drink alcohol.  Do not use any products that contain nicotine or tobacco, such as cigarettes and e-cigarettes. If you need help quitting, ask your health care provider.  Do not use any medicinal herbs or unprescribed drugs. These chemicals affect the formation and growth of the baby.  Do not douche or use tampons or scented sanitary pads.  Do not cross your legs for long periods of time.  To prepare for the arrival of your baby: ? Take prenatal classes to understand, practice, and ask questions about labor and delivery. ? Make a trial run to the hospital. ? Visit the hospital and tour the maternity area. ? Arrange for maternity or paternity leave through employers. ? Arrange for family and friends to take care of pets while you are in the hospital. ? Purchase a rear-facing car seat and make sure you know how to install it in your car. ? Pack your hospital bag. ? Prepare the baby's nursery. Make sure to remove all pillows and stuffed animals from the baby's crib to prevent suffocation.  Visit your dentist if you have not gone during your pregnancy. Use a soft toothbrush to brush your teeth and be gentle when you floss. Contact a health care provider if:  You are unsure if you are in labor or if your water has broken.  You become dizzy.  You have mild pelvic cramps, pelvic pressure, or nagging pain in your abdominal area.  You have lower back pain.  You have persistent nausea, vomiting, or diarrhea.   You have an unusual or bad smelling vaginal discharge.  You have pain when you urinate. Get help right away if:  Your water breaks before 37 weeks.  You have regular contractions less than 5 minutes apart before 37 weeks.  You have a fever.  You are leaking fluid from your vagina.  You have spotting or bleeding from your vagina.  You have severe abdominal pain or cramping.  You have rapid weight loss or weight gain.  You have   shortness of breath with chest pain.  You notice sudden or extreme swelling of your face, hands, ankles, feet, or legs.  Your baby makes fewer than 10 movements in 2 hours.  You have severe headaches that do not go away when you take medicine.  You have vision changes. Summary  The third trimester is from week 28 through week 40, months 7 through 9. The third trimester is a time when the unborn baby (fetus) is growing rapidly.  During the third trimester, your discomfort may increase as you and your baby continue to gain weight. You may have abdominal, leg, and back pain, sleeping problems, and an increased need to urinate.  During the third trimester your breasts will keep growing and they will continue to become tender. A yellow fluid (colostrum) may leak from your breasts. This is the first milk you are producing for your baby.  False labor is a condition in which you feel small, irregular tightenings of the muscles in the womb (contractions) that eventually go away. These are called Braxton Hicks contractions. Contractions may last for hours, days, or even weeks before true labor sets in.  Signs of labor can include: abdominal cramps; regular contractions that start at 10 minutes apart and become stronger and more frequent with time; watery or bloody mucus discharge that comes from the vagina; increased pelvic pressure and dull back pain; and leaking of amniotic fluid. This information is not intended to replace advice given to you by your health  care provider. Make sure you discuss any questions you have with your health care provider. Document Released: 02/15/2001 Document Revised: 06/14/2018 Document Reviewed: 03/29/2016 [employer] Patient Education  2020 [employer]  

## 2019-01-01 NOTE — Progress Notes (Signed)
Subjective:  Connie Fisher is a 23 y.o. G1P0 at [redacted]w[redacted]d being seen today for ongoing prenatal care.  She is currently monitored for the following issues for this high-risk pregnancy and has Supervision of normal first pregnancy; Maternal morbid obesity, antepartum ([place]); Rh negative state in antepartum period; and Genetic carrier status on their problem list.  Patient reports no complaints.  Contractions: Not present. Vag. Bleeding: None.  Movement: Present. Denies leaking of fluid.   The following portions of the patient's history were reviewed and updated as appropriate: allergies, current medications, past family history, past medical history, past social history, past surgical history and problem list. Problem list updated.  Objective:   Vitals:   01/01/19 0915  BP: 114/76  Pulse: (!) 103  Weight: 282 lb (127.9 kg)    Fetal Status: Fetal Heart Rate (bpm): 154   Movement: Present     General:  Alert, oriented and cooperative. Patient is in no acute distress.  Skin: Skin is warm and dry. No rash noted.   Cardiovascular: Normal heart rate noted  Respiratory: Normal respiratory effort, no problems with respiration noted  Abdomen: Soft, gravid, appropriate for gestational age. Pain/Pressure: Absent     Pelvic:  Cervical exam deferred        Extremities: Normal range of motion.  Edema: None  Mental Status: Normal mood and affect. Normal behavior. Normal judgment and thought content.   Urinalysis:      Assessment and Plan:  Pregnancy: G1P0 at [redacted]w[redacted]d  1. Supervision of normal first pregnancy, antepartum Stable - Glucose Tolerance, 2 Hours w/1 Hour - CBC - RPR - HIV Antibody (routine testing w rflx)  2. Flu vaccine need  - Flu Vaccine QUAD 36+ mos IM  3. 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 Growth scan normal 12/18/18, f/u next month  4. Rh negative state in antepartum period Rhogam today  5. Maternal morbid obesity, antepartum ([place])   Preterm labor symptoms  and general obstetric precautions including but not limited to vaginal bleeding, contractions, leaking of fluid and fetal movement were reviewed in detail with the patient. Please refer to After Visit Summary for other counseling recommendations.  Return in about 3 weeks (around 01/22/2019) for OB visit, face to face.   Chancy Milroy, MD

## 2019-01-02 ENCOUNTER — Telehealth: Payer: Self-pay

## 2019-01-02 LAB — CBC
Hematocrit: 34.8 % (ref 34.0–46.6)
Hemoglobin: 11.8 g/dL (ref 11.1–15.9)
MCH: 30.7 pg (ref 26.6–33.0)
MCHC: 33.9 g/dL (ref 31.5–35.7)
MCV: 91 fL (ref 79–97)
Platelets: 200 10*3/uL (ref 150–450)
RBC: 3.84 x10E6/uL (ref 3.77–5.28)
RDW: 11.7 % (ref 11.7–15.4)
WBC: 10.1 10*3/uL (ref 3.4–10.8)

## 2019-01-02 LAB — GLUCOSE TOLERANCE, 2 HOURS W/ 1HR
Glucose, 1 hour: 108 mg/dL (ref 65–179)
Glucose, 2 hour: 107 mg/dL (ref 65–152)
Glucose, Fasting: 88 mg/dL (ref 65–91)

## 2019-01-02 LAB — RPR: RPR Ser Ql: NONREACTIVE

## 2019-01-02 LAB — HIV ANTIBODY (ROUTINE TESTING W REFLEX): HIV Screen 4th Generation wRfx: NONREACTIVE

## 2019-01-02 NOTE — Telephone Encounter (Signed)
Attempted to contact about glucose results, number disconnected.

## 2019-01-17 ENCOUNTER — Other Ambulatory Visit: Payer: Self-pay

## 2019-01-17 ENCOUNTER — Encounter (HOSPITAL_COMMUNITY): Payer: Self-pay

## 2019-01-17 ENCOUNTER — Inpatient Hospital Stay (HOSPITAL_COMMUNITY)
Admission: AD | Admit: 2019-01-17 | Discharge: 2019-01-17 | Disposition: A | Discharge status: Home / Self Care | Payer: Medicaid Other | Attending: Obstetrics and Gynecology | Admitting: Obstetrics and Gynecology

## 2019-01-17 DIAGNOSIS — M545 Low back pain: Secondary | ICD-10-CM | POA: Diagnosis not present

## 2019-01-17 DIAGNOSIS — R030 Elevated blood-pressure reading, without diagnosis of hypertension: Secondary | ICD-10-CM | POA: Diagnosis not present

## 2019-01-17 DIAGNOSIS — Z3A29 29 weeks gestation of pregnancy: Secondary | ICD-10-CM | POA: Diagnosis not present

## 2019-01-17 DIAGNOSIS — R1013 Epigastric pain: Secondary | ICD-10-CM | POA: Diagnosis not present

## 2019-01-17 DIAGNOSIS — O99613 Diseases of the digestive system complicating pregnancy, third trimester: Secondary | ICD-10-CM | POA: Diagnosis not present

## 2019-01-17 DIAGNOSIS — R12 Heartburn: Secondary | ICD-10-CM

## 2019-01-17 DIAGNOSIS — K219 Gastro-esophageal reflux disease without esophagitis: Secondary | ICD-10-CM | POA: Diagnosis not present

## 2019-01-17 DIAGNOSIS — O26893 Other specified pregnancy related conditions, third trimester: Secondary | ICD-10-CM | POA: Diagnosis not present

## 2019-01-17 DIAGNOSIS — O99213 Obesity complicating pregnancy, third trimester: Secondary | ICD-10-CM | POA: Diagnosis not present

## 2019-01-17 DIAGNOSIS — M549 Dorsalgia, unspecified: Secondary | ICD-10-CM

## 2019-01-17 DIAGNOSIS — O99891 Other specified diseases and conditions complicating pregnancy: Secondary | ICD-10-CM

## 2019-01-17 LAB — URINALYSIS, ROUTINE W REFLEX MICROSCOPIC
Bilirubin Urine: NEGATIVE
Glucose, UA: NEGATIVE mg/dL
Hgb urine dipstick: NEGATIVE
Ketones, ur: NEGATIVE mg/dL
Leukocytes,Ua: NEGATIVE
Nitrite: NEGATIVE
Protein, ur: NEGATIVE mg/dL
Specific Gravity, Urine: 1.01 (ref 1.005–1.030)
pH: 6 (ref 5.0–8.0)

## 2019-01-17 LAB — COMPREHENSIVE METABOLIC PANEL
ALT: 34 U/L (ref 0–44)
AST: 27 U/L (ref 15–41)
Albumin: 2.8 g/dL — ABNORMAL LOW (ref 3.5–5.0)
Alkaline Phosphatase: 74 U/L (ref 38–126)
Anion gap: 8 (ref 5–15)
BUN: <5 mg/dL — ABNORMAL LOW (ref 6–20)
CO2: 21 mmol/L — ABNORMAL LOW (ref 22–32)
Calcium: 9 mg/dL (ref 8.9–10.3)
Chloride: 106 mmol/L (ref 98–111)
Creatinine, Ser: 0.5 mg/dL (ref 0.44–1.00)
GFR calc Af Amer: >60 mL/min (ref >60)
GFR calc non Af Amer: >60 mL/min (ref >60)
Glucose, Bld: 91 mg/dL (ref 70–99)
Potassium: 3.8 mmol/L (ref 3.5–5.1)
Sodium: 135 mmol/L (ref 135–145)
Total Bilirubin: 0.4 mg/dL (ref 0.3–1.2)
Total Protein: 6.5 g/dL (ref 6.5–8.1)

## 2019-01-17 LAB — CBC
HCT: 35.2 % — ABNORMAL LOW (ref 36.0–46.0)
Hemoglobin: 12.1 g/dL (ref 12.0–15.0)
MCH: 31 pg (ref 26.0–34.0)
MCHC: 34.4 g/dL (ref 30.0–36.0)
MCV: 90.3 fL (ref 80.0–100.0)
Platelets: 229 10*3/uL (ref 150–400)
RBC: 3.9 MIL/uL (ref 3.87–5.11)
RDW: 12.3 % (ref 11.5–15.5)
WBC: 12.3 10*3/uL — ABNORMAL HIGH (ref 4.0–10.5)
nRBC: 0 % (ref 0.0–0.2)

## 2019-01-17 LAB — PROTEIN / CREATININE RATIO, URINE
Creatinine, Urine: 64.44 mg/dL
Total Protein, Urine: <6 mg/dL

## 2019-01-17 LAB — AMYLASE: Amylase: 65 U/L (ref 28–100)

## 2019-01-17 LAB — LIPASE, BLOOD: Lipase: 26 U/L (ref 11–51)

## 2019-01-17 MED ORDER — CYCLOBENZAPRINE HCL 5 MG PO TABS: 5.0000 mg | ORAL_TABLET | Freq: Three times a day (TID) | ORAL | 0 refills | Status: DC | PRN

## 2019-01-17 MED ORDER — ALUM & MAG HYDROXIDE-SIMETH 200-200-20 MG/5ML PO SUSP
30.0000 mL | Freq: Once | ORAL | Status: AC
  Administered 2019-01-17: 30 mL via ORAL
  Filled 2019-01-17: qty 30

## 2019-01-17 MED ORDER — LIDOCAINE VISCOUS HCL 2 % MT SOLN
15.0000 mL | Freq: Once | OROMUCOSAL | Status: AC
  Administered 2019-01-17: 15 mL via ORAL
  Filled 2019-01-17: qty 15

## 2019-01-17 MED ORDER — OMEPRAZOLE 20 MG PO CPDR: 20.0000 mg | DELAYED_RELEASE_CAPSULE | Freq: Every day | ORAL | 1 refills | Status: DC

## 2019-01-17 MED ORDER — ACETAMINOPHEN 325 MG PO TABS
650.0000 mg | ORAL_TABLET | Freq: Once | ORAL | Status: AC
  Administered 2019-01-17: 650 mg via ORAL
  Filled 2019-01-17: qty 2

## 2019-01-17 MED ORDER — CYCLOBENZAPRINE HCL 10 MG PO TABS
10.0000 mg | ORAL_TABLET | Freq: Once | ORAL | Status: AC
  Administered 2019-01-17: 17:00:00 10 mg via ORAL
  Filled 2019-01-17: qty 1

## 2019-01-17 NOTE — Discharge Instructions (Signed)
Back Pain in Pregnancy Back pain during pregnancy is common. Back pain may be caused by several factors that are related to changes during your pregnancy. Follow these instructions at home: Managing pain, stiffness, and swelling      If directed, for sudden (acute) back pain, put ice on the painful area. ? Put ice in a plastic bag. ? Place a towel between your skin and the bag. ? Leave the ice on for 20 minutes, 2-3 times per day.  If directed, apply heat to the affected area before you exercise. Use the heat source that your health care provider recommends, such as a moist heat pack or a heating pad. ? Place a towel between your skin and the heat source. ? Leave the heat on for 20-30 minutes. ? Remove the heat if your skin turns bright red. This is especially important if you are unable to feel pain, heat, or cold. You may have a greater risk of getting burned.  If directed, massage the affected area. Activity  Exercise as told by your health care provider. Gentle exercise is the best way to prevent or manage back pain.  Listen to your body when lifting. If lifting hurts, ask for help or bend your knees. This uses your leg muscles instead of your back muscles.  Squat down when picking up something from the floor. Do not bend over.  Only use bed rest for short periods as told by your health care provider. Bed rest should only be used for the most severe episodes of back pain. Standing, sitting, and lying down  Do not stand in one place for long periods of time.  Use good posture when sitting. Make sure your head rests over your shoulders and is not hanging forward. Use a pillow on your lower back if necessary.  Try sleeping on your side, preferably the left side, with a pregnancy support pillow or 1-2 regular pillows between your legs. ? If you have back pain after a night's rest, your bed may be too soft. ? A firm mattress may provide more support for your back during  pregnancy. General instructions  Do not wear high heels.  Eat a healthy diet. Try to gain weight within your health care provider's recommendations.  Use a maternity girdle, elastic sling, or back brace as told by your health care provider.  Take over-the-counter and prescription medicines only as told by your health care provider.  Work with a physical therapist or massage therapist to find ways to manage back pain. Acupuncture or massage therapy may be helpful.  Keep all follow-up visits as told by your health care provider. This is important. Contact a health care provider if:  Your back pain interferes with your daily activities.  You have increasing pain in other parts of your body. Get help right away if:  You develop numbness, tingling, weakness, or problems with the use of your arms or legs.  You develop severe back pain that is not controlled with medicine.  You have a change in bowel or bladder control.  You develop shortness of breath, dizziness, or you faint.  You develop nausea, vomiting, or sweating.  You have back pain that is a rhythmic, cramping pain similar to labor pains. Labor pain is usually 1-2 minutes apart, lasts for about 1 minute, and involves a bearing down feeling or pressure in your pelvis.  You have back pain and your water breaks or you have vaginal bleeding.  You have back pain or numbness  that travels down your leg.  Your back pain developed after you fell.  You develop pain on one side of your back.  You see blood in your urine.  You develop skin blisters in the area of your back pain. Summary  Back pain may be caused by several factors that are related to changes during your pregnancy.  Follow instructions as told by your health care provider for managing pain, stiffness, and swelling.  Exercise as told by your health care provider. Gentle exercise is the best way to prevent or manage back pain.  Take over-the-counter and  prescription medicines only as told by your health care provider.  Keep all follow-up visits as told by your health care provider. This is important. This information is not intended to replace advice given to you by your health care provider. Make sure you discuss any questions you have with your health care provider. Document Released: 06/01/2005 Document Revised: 06/12/2018 Document Reviewed: 08/09/2017 Elsevier Patient Education  2020 [employer]  Heartburn During Pregnancy Heartburn is a type of pain or discomfort that can happen in the throat or chest. It is often described as a burning sensation. Heartburn is common during pregnancy because:  A hormone (progesterone) that is released during pregnancy may relax the valve (lower esophageal sphincter, or LES) that separates the esophagus from the stomach. This allows stomach acid to move up into the esophagus, causing heartburn.  The uterus gets larger and pushes up on the stomach, which pushes more acid into the esophagus. This is especially true in the later stages of pregnancy. Heartburn usually goes away or gets better after giving birth. What are the causes? Heartburn is caused by stomach acid backing up into the esophagus (reflux). Reflux can be triggered by:  Changing hormone levels.  Large meals.  Certain foods and beverages, such as coffee, chocolate, onions, and peppermint.  Exercise.  Increased stomach acid production. What increases the risk? You are more likely to experience heartburn during pregnancy if you:  Had heartburn prior to becoming pregnant.  Have been pregnant more than once before.  Are overweight or obese. The likelihood that you will get heartburn also increases as you get farther along in your pregnancy, especially during the last trimester. What are the signs or symptoms? Symptoms of this condition include:  Burning pain in the chest or lower throat.  Bitter taste in the  mouth.  Coughing.  Problems swallowing.  Vomiting.  Hoarse voice.  Asthma. Symptoms may get worse when you lie down or bend over. Symptoms are often worse at night. How is this diagnosed? This condition is diagnosed based on:  Your medical history.  Your symptoms.  Blood tests to check for a certain type of bacteria associated with heartburn.  Whether taking heartburn medicine relieves your symptoms.  Examination of the stomach and esophagus using a tube with a light and camera on the end (endoscopy). How is this treated? Treatment varies depending on how severe your symptoms are. Your health care provider may recommend:  Over-the-counter medicines (antacids or acid reducers) for mild heartburn.  Prescription medicines to decrease stomach acid or to protect your stomach lining.  Certain changes in your diet.  Raising the head of your bed so it is higher than the foot of the bed. This helps prevent stomach acid from backing up into the esophagus when you are lying down. Follow these instructions at home: Eating and drinking  Do not drink alcohol during your pregnancy.  Identify foods and  beverages that make your symptoms worse, and avoid them.  Beverages that you may want to avoid include: ? Coffee and tea (with or without caffeine). ? Energy drinks and sports drinks. ? Carbonated drinks or sodas. ? Citrus fruit juices.  Foods that you may want to avoid include: ? Chocolate and cocoa. ? Peppermint and mint flavorings. ? Garlic, onions, and horseradish. ? Spicy and acidic foods, including peppers, chili powder, curry powder, vinegar, hot sauces, and barbecue sauce. ? Citrus fruits, such as oranges, lemons, and limes. ? Tomato-based foods, such as red sauce, chili, and salsa. ? Fried and fatty foods, such as donuts, french fries, potato chips, and high-fat dressings. ? High-fat meats, such as hot dogs, cold cuts, sausage, ham, and bacon. ? High-fat dairy items,  such as whole milk, butter, and cheese.  Eat small, frequent meals instead of large meals.  Avoid drinking large amounts of liquid with your meals.  Avoid eating meals during the 2-3 hours before bedtime.  Avoid lying down right after you eat.  Do not exercise right after you eat. Medicines  Take over-the-counter and prescription medicines only as told by your health care provider.  Do not take aspirin, ibuprofen, or other NSAIDs unless your health care provider tells you to do that.  You may be instructed to avoid medicines that contain sodium bicarbonate. General instructions   If directed, raise the head of your bed about 6 inches (15 cm) by putting blocks under the legs. Sleeping with more pillows does not effectively relieve heartburn because it only changes the position of your head.  Do not use any products that contain nicotine or tobacco, such as cigarettes and e-cigarettes. If you need help quitting, ask your health care provider.  Wear loose-fitting clothing.  Try to reduce your stress, such as with yoga or meditation. If you need help managing stress, ask your health care provider.  Maintain a healthy weight. If you are overweight, work with your health care provider to safely lose weight.  Keep all follow-up visits as told by your health care provider. This is important. Contact a health care provider if:  You develop new symptoms.  Your symptoms do not improve with treatment.  You have unexplained weight loss.  You have difficulty swallowing.  You make loud sounds when you breathe (wheeze).  You have a cough that does not go away.  You have frequent heartburn for more than 2 weeks.  You have nausea or vomiting that does not get better with treatment.  You have pain in your abdomen. Get help right away if:  You have severe chest pain that spreads to your arm, neck, or jaw.  You feel sweaty, dizzy, or light-headed.  You have shortness of  breath.  You have pain when swallowing.  You vomit, and your vomit looks like blood or coffee grounds.  Your stool is bloody or black. This information is not intended to replace advice given to you by your health care provider. Make sure you discuss any questions you have with your health care provider. Document Released: 02/19/2000 Document Revised: 05/22/2017 Document Reviewed: 11/09/2015 Elsevier Patient Education  2020 [employer].

## 2019-01-17 NOTE — MAU Note (Signed)
.   Connie Fisher is a 23 y.o. at [redacted]w[redacted]d here in MAU reporting: upper abdominal pain and lower back pain since yesterday. Pt states she vomited twice this morning.She says she has not felt the baby move but once today and none yesterday. Denies any VB or LOF  Onset of complaint: yesterday Pain score: 10 Vitals:   01/17/19 1559  BP: (!) 142/86  Pulse: (!) 101  Resp: 16  Temp: 97.8 F (36.6 C)  SpO2: 100%     FHT:145 Lab orders placed from triage: UA

## 2019-01-17 NOTE — MAU Provider Note (Signed)
History     CSN: [id number]  Arrival date and time: 01/17/19 1547   First Provider Initiated Contact with Patient 01/17/19 1626      Chief Complaint  Patient presents with  . Abdominal Pain  . Decreased Fetal Movement  . Back Pain  . Emesis   HPI Connie Fisher is a 23 y.o. G1P0 at 4578w2d here for epigastric pain and back pain. Patient reports her epigastric pain started around noon yesterday, about an hour after lunch (sausage, bacon, and cheese). Pain is constant with variable intensity from 7-10/10 and sitting up alleviates the pain. Patient also endorses constant mild mid-low back pain that started this morning. Patient tried tylenol without improvement. Patient vomited twice this morning without blood. No nausea. Patient is also concerned regarding fetal movement. She reports she did not feel any fetal movement yesterday and possibly one today so far.  Patient denies Hx of HTN.   Past Medical History:  Diagnosis Date  . Heart murmur    AS A BABY  . Morbid obesity (HCC)     Past Surgical History:  Procedure Laterality Date  . NO PAST SURGERIES      Family History  Problem Relation Age of Onset  . Hypertension Mother   . Diabetes Father   . Asthma Brother   . Arthritis Paternal Uncle   . Cancer Paternal Uncle   . Diabetes Paternal Grandmother     Social History   Tobacco Use  . Smoking status: Never Smoker  . Smokeless tobacco: Never Used  Substance Use Topics  . Alcohol use: Not Currently  . Drug use: Never    Allergies: No Known Allergies  No medications prior to admission.    Review of Systems  Constitutional: Negative for chills and fever.  Respiratory: Negative for chest tightness and shortness of breath.   Cardiovascular: Negative for chest pain, palpitations and leg swelling.  Gastrointestinal: Positive for abdominal pain (epigastric) and vomiting. Negative for nausea.  Genitourinary: Negative.  Negative for dysuria, frequency, hematuria,  urgency, vaginal bleeding and vaginal discharge.  Musculoskeletal: Positive for back pain.  Neurological: Negative for syncope and headaches.   Physical Exam   Blood pressure 121/70, pulse (!) 105, temperature 97.8 F (36.6 C), resp. rate 16, weight 128 kg, last menstrual period 06/26/2018, SpO2 98 %.  Physical Exam  Nursing note and vitals reviewed. Constitutional: She is oriented to person, place, and time. She appears well-developed and well-nourished. No distress.  Cardiovascular: Normal rate.  Respiratory: Effort normal.  GI: Soft. There is abdominal tenderness in the epigastric area. There is no rebound, no guarding and negative Murphy's sign.  Musculoskeletal:        General: No edema.  Neurological: She is alert and oriented to person, place, and time.  Skin: Skin is warm and dry. No erythema.  Psychiatric: She has a normal mood and affect.  FHR : 135bpm baseline, moderate variability, +accels, -deccel [place] : no uterine contractions seen today  MAU Course  Procedures   MDM Labs ordered : EKG, amylase and lipase, CBC, CMP, urine protein/Cr ratio  EKG : normal sinus rhythm, low voltage QRS most likely due to patient's high BMI. Normal PR interval, QRS duration, QT/QTc, and axis. No infarct FHR stable at 135bpm baseline with moderate variability, +accels, and -deccel. No uterine contractions seen [place] today.  17:21 - acetaminophen PO 650mg  and cyclobenzaprine PO 10mg  given for back pain 17:42 - Maalox/mylanta PO 30ml and lidocaine 2% PO 15ml given for epigastric pain 18:05 -  patient reports complete resolution of both epigastric and back pain. Patient updated on negative EKG findings. 19:20 - patient continues to be in no pain. Discussed lab results with patient. Patient verbalizes understanding and has no questions at this time.  Results for orders placed or performed during the hospital encounter of 01/17/19 (from the past 24 hour(s))  Urinalysis, Routine w reflex  microscopic     Status: Abnormal   Collection Time: 01/17/19  4:31 PM  Result Value Ref Range   Color, Urine YELLOW YELLOW   APPearance HAZY (A) CLEAR   Specific Gravity, Urine 1.010 1.005 - 1.030   pH 6.0 5.0 - 8.0   Glucose, UA NEGATIVE NEGATIVE mg/dL   Hgb urine dipstick NEGATIVE NEGATIVE   Bilirubin Urine NEGATIVE NEGATIVE   Ketones, ur NEGATIVE NEGATIVE mg/dL   Protein, ur NEGATIVE NEGATIVE mg/dL   Nitrite NEGATIVE NEGATIVE   Leukocytes,Ua NEGATIVE NEGATIVE  CBC     Status: Abnormal   Collection Time: 01/17/19  5:21 PM  Result Value Ref Range   WBC 12.3 (H) 4.0 - 10.5 K/uL   RBC 3.90 3.87 - 5.11 MIL/uL   Hemoglobin 12.1 12.0 - 15.0 g/dL   HCT 35.2 (L) 36.0 - 46.0 %   MCV 90.3 80.0 - 100.0 fL   MCH 31.0 26.0 - 34.0 pg   MCHC 34.4 30.0 - 36.0 g/dL   RDW 12.3 11.5 - 15.5 %   Platelets 229 150 - 400 K/uL   nRBC 0.0 0.0 - 0.2 %  Comprehensive metabolic panel     Status: Abnormal   Collection Time: 01/17/19  5:21 PM  Result Value Ref Range   Sodium 135 135 - 145 mmol/L   Potassium 3.8 3.5 - 5.1 mmol/L   Chloride 106 98 - 111 mmol/L   CO2 21 (L) 22 - 32 mmol/L   Glucose, Bld 91 70 - 99 mg/dL   BUN <5 (L) 6 - 20 mg/dL   Creatinine, Ser 0.50 0.44 - 1.00 mg/dL   Calcium 9.0 8.9 - 10.3 mg/dL   Total Protein 6.5 6.5 - 8.1 g/dL   Albumin 2.8 (L) 3.5 - 5.0 g/dL   AST 27 15 - 41 U/L   ALT 34 0 - 44 U/L   Alkaline Phosphatase 74 38 - 126 U/L   Total Bilirubin 0.4 0.3 - 1.2 mg/dL   GFR calc non Af Amer >60 >60 mL/min   GFR calc Af Amer >60 >60 mL/min   Anion gap 8 5 - 15  Amylase     Status: None   Collection Time: 01/17/19  5:21 PM  Result Value Ref Range   Amylase 65 28 - 100 U/L  Lipase, blood     Status: None   Collection Time: 01/17/19  5:21 PM  Result Value Ref Range   Lipase 26 11 - 51 U/L  Protein / creatinine ratio, urine     Status: None   Collection Time: 01/17/19  5:30 PM  Result Value Ref Range   Creatinine, Urine 64.44 mg/dL   Total Protein, Urine <6 mg/dL    Protein Creatinine Ratio        0.00 - 0.15 mg/mg[Cre]    Assessment and Plan  1. GERD during pregnancy : Maalox/Mylanta PO 26ml and lidocaine 2% PO 27ml given in unit. Discharge with omeprazole 20mg  PO QD. Discussed taking omeprazole in the AM at least 55min before breakfast. May increase to 20mg  BID if needed.   2. Back pain : acetaminophen 650mg  and cyclobenzaprine  10mg  given in unit. Discharge with cyclobenzaprine 5mg , PO, TID, PRN.  3. Elevated BP without diagnosis of HTN : Urine Cr and protein WNL today. Discussed preeclampsia signs and symptoms and should patient experience any, return to closest ER.   Safe to discharge to Home  - resume current medications   01/17/2019, 7:24 PM    I confirm that I have verified the information documented in the physician assistant student's note and that I have also personally performed the history, physical exam and all medical decision making activities of this service and have verified that all service and findings are accurately documented in this student's note.   Pt had benign abdominal exam including negative murphy's sign.  Elevated BP x 1. Repeat BPs normal. PEC labs negative. Pt without hx of hypertension.  EKG reviewed by Dr. Eduard Clos Reactive fetal tracing with active movement Pain completely relieved by maalox/viscous lidocaine & tylenol/flexeril. Will d/c home with rx omeprazole & flexeril.   13/02/2019, NP 01/17/2019 7:41 PM

## 2019-01-22 ENCOUNTER — Ambulatory Visit (HOSPITAL_COMMUNITY)
Admission: RE | Admit: 2019-01-22 | Discharge: 2019-01-22 | Disposition: A | Discharge status: Home / Self Care | Payer: Medicaid Other | Source: Ambulatory Visit | Attending: Maternal & Fetal Medicine | Admitting: Maternal & Fetal Medicine

## 2019-01-22 ENCOUNTER — Encounter (HOSPITAL_COMMUNITY): Payer: Self-pay | Admitting: *Deleted

## 2019-01-22 ENCOUNTER — Encounter: Payer: Self-pay | Admitting: Obstetrics and Gynecology

## 2019-01-22 ENCOUNTER — Ambulatory Visit (HOSPITAL_COMMUNITY): Payer: Medicaid Other | Admitting: *Deleted

## 2019-01-22 ENCOUNTER — Ambulatory Visit (INDEPENDENT_AMBULATORY_CARE_PROVIDER_SITE_OTHER): Payer: Medicaid Other | Admitting: Obstetrics and Gynecology

## 2019-01-22 ENCOUNTER — Other Ambulatory Visit: Payer: Self-pay

## 2019-01-22 ENCOUNTER — Other Ambulatory Visit (HOSPITAL_COMMUNITY): Payer: Self-pay | Admitting: *Deleted

## 2019-01-22 VITALS — BP 110/76 | HR 106 | Wt 292.0 lb

## 2019-01-22 DIAGNOSIS — O360130 Maternal care for anti-D [Rh] antibodies, third trimester, not applicable or unspecified: Secondary | ICD-10-CM | POA: Diagnosis not present

## 2019-01-22 DIAGNOSIS — O26893 Other specified pregnancy related conditions, third trimester: Secondary | ICD-10-CM

## 2019-01-22 DIAGNOSIS — Z3403 Encounter for supervision of normal first pregnancy, third trimester: Secondary | ICD-10-CM | POA: Insufficient documentation

## 2019-01-22 DIAGNOSIS — O99210 Obesity complicating pregnancy, unspecified trimester: Secondary | ICD-10-CM

## 2019-01-22 DIAGNOSIS — Z3A30 30 weeks gestation of pregnancy: Secondary | ICD-10-CM

## 2019-01-22 DIAGNOSIS — O99213 Obesity complicating pregnancy, third trimester: Secondary | ICD-10-CM

## 2019-01-22 DIAGNOSIS — Z362 Encounter for other antenatal screening follow-up: Secondary | ICD-10-CM

## 2019-01-22 DIAGNOSIS — O360930 Maternal care for other rhesus isoimmunization, third trimester, not applicable or unspecified: Secondary | ICD-10-CM

## 2019-01-22 DIAGNOSIS — Z6791 Unspecified blood type, Rh negative: Secondary | ICD-10-CM

## 2019-01-22 DIAGNOSIS — Z6841 Body Mass Index (BMI) 40.0 and over, adult: Secondary | ICD-10-CM

## 2019-01-22 NOTE — Progress Notes (Signed)
   PRENATAL VISIT NOTE  Subjective:  Connie Fisher is a 23 y.o. G1P0 at [redacted]w[redacted]d being seen today for ongoing prenatal care.  She is currently monitored for the following issues for this low-risk pregnancy and has Supervision of normal first pregnancy; Maternal morbid obesity, antepartum ([place]); Rh negative state in antepartum period; and Genetic carrier status on their problem list.  Patient reports no complaints.  Contractions: Not present. Vag. Bleeding: None.  Movement: Present. Denies leaking of fluid.   The following portions of the patient's history were reviewed and updated as appropriate: allergies, current medications, past family history, past medical history, past social history, past surgical history and problem list.   Objective:   Vitals:   01/22/19 0958  BP: 110/76  Pulse: (!) 106  Weight: 292 lb (132.5 kg)    Fetal Status: Fetal Heart Rate (bpm): 154 Fundal Height: 30 cm Movement: Present     General:  Alert, oriented and cooperative. Patient is in no acute distress.  Skin: Skin is warm and dry. No rash noted.   Cardiovascular: Normal heart rate noted  Respiratory: Normal respiratory effort, no problems with respiration noted  Abdomen: Soft, gravid, appropriate for gestational age.  Pain/Pressure: Absent     Pelvic: Cervical exam deferred        Extremities: Normal range of motion.  Edema: None  Mental Status: Normal mood and affect. Normal behavior. Normal judgment and thought content.   Assessment and Plan:  Pregnancy: G1P0 at [redacted]w[redacted]d 1. 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 Rh negative state in antepartum period S/p rhogam  3. Maternal morbid obesity, antepartum ([place]) Follow up ultrasound Continue ASA  Preterm labor symptoms and general obstetric precautions including but not  limited to vaginal bleeding, contractions, leaking of fluid and fetal movement were reviewed in detail with the patient. Please refer to After Visit Summary for other counseling recommendations.   Return in about 2 weeks (around 02/05/2019) for Virtual, ROB, Low risk.  Future Appointments  Date Time Provider [place]  01/22/2019  1:15 PM [place] [facility]  01/22/2019  1:15 PM [place] [facility]    Mora Bellman, MD

## 2019-02-05 ENCOUNTER — Encounter: Payer: Self-pay | Admitting: Obstetrics

## 2019-02-05 ENCOUNTER — Telehealth (INDEPENDENT_AMBULATORY_CARE_PROVIDER_SITE_OTHER): Payer: Medicaid Other | Admitting: Obstetrics

## 2019-02-05 ENCOUNTER — Other Ambulatory Visit: Payer: Self-pay

## 2019-02-05 DIAGNOSIS — Z3A32 32 weeks gestation of pregnancy: Secondary | ICD-10-CM

## 2019-02-05 DIAGNOSIS — O285 Abnormal chromosomal and genetic finding on antenatal screening of mother: Secondary | ICD-10-CM

## 2019-02-05 DIAGNOSIS — Z3403 Encounter for supervision of normal first pregnancy, third trimester: Secondary | ICD-10-CM

## 2019-02-05 DIAGNOSIS — O26893 Other specified pregnancy related conditions, third trimester: Secondary | ICD-10-CM

## 2019-02-05 DIAGNOSIS — M549 Dorsalgia, unspecified: Secondary | ICD-10-CM

## 2019-02-05 MED ORDER — COMFORT FIT MATERNITY SUPP SM MISC: 0 refills | Status: DC

## 2019-02-05 NOTE — Progress Notes (Signed)
TELEHEALTH OBSTETRICS PRENATAL VIRTUAL VIDEO VISIT ENCOUNTER NOTE  Provider location: [facility] for [facility] [place]   I connected with Connie Fisher on 02/05/19 at  2:00 PM EST by OB MyChart Video Encounter at home and verified that I am speaking with the correct person using two identifiers.   I discussed the limitations, risks, security and privacy concerns of performing an evaluation and management service virtually and the availability of in person appointments. I also discussed with the patient that there may be a patient responsible charge related to this service. The patient expressed understanding and agreed to proceed. Subjective:  Connie Fisher is a 23 y.o. G1P0 at [redacted]w[redacted]d being seen today for ongoing prenatal care.  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  Patient reports backache and heartburn.  Contractions: Not present. Vag. Bleeding: None.  Movement: Present. Denies any leaking of fluid.   The following portions of the patient's history were reviewed and updated as appropriate: allergies, current medications, past family history, past medical history, past social history, past surgical history and problem list.   Objective:  There were no vitals filed for this visit.  Fetal Status:     Movement: Present     General:  Alert, oriented and cooperative. Patient is in no acute distress.  Respiratory: Normal respiratory effort, no problems with respiration noted  Mental Status: Normal mood and affect. Normal behavior. Normal judgment and thought content.  Rest of physical exam deferred due to type of encounter  Imaging: [place] Mfm Ob Follow Up  Result Date: 01/22/2019 ----------------------------------------------------------------------  OBSTETRICS  REPORT                       (Signed Final 01/22/2019 01:56 pm) ---------------------------------------------------------------------- Patient Info  ID #:       [id number]                          D.O.B.:  12-13-1995 (23 yrs)  Name:       Connie Fisher                  Visit Date: 01/22/2019 01:28 pm ---------------------------------------------------------------------- Performed By  Performed By:     Emeline Darling BS,      Ref. Address:     [address]                                                             [place], [place]  [address]  Attending:        Ma Rings MD         [facility]  Referred By:      Tereso Newcomer MD ---------------------------------------------------------------------- Orders   #  Description                          Code         Ordered By   1  [place] MFM OB FOLLOW UP                  84132.44     Lin Landsman  ----------------------------------------------------------------------   #  Order #                    Accession #                 Episode #   1  [id number]                  [id number]                  [id number]  ---------------------------------------------------------------------- Indications   Obesity complicating pregnancy, third          O99.213   trimester   Rh negative state in antepartum                O36.0190   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 Cord Insertion:      Previously Visualized  Amniotic Fluid  AFI FV:      Within normal limits  AFI Sum(cm)     %Tile       Largest Pocket(cm)  18.98           73          7.59  RUQ(cm)       RLQ(cm)       LUQ(cm)        LLQ(cm)  7.59          5.11          3.05           3.23 ---------------------------------------------------------------------- Biometry  BPD:      76.2  mm     G. Age:  30w 4d         56  %    CI:        76.85   %    70 - 86                                                          FL/HC:      22.2   %    19.2 - 21.4  HC:      275.3  mm     G. Age:  30w 1d         18  %    HC/AC:      0.98        0.99 - 1.21  AC:      282.3  mm     G. Age:  32w 2d         95  %    FL/BPD:     80.1   %    71 - 87  FL:         61  mm     G. Age:  31w 5d         81  %    FL/AC:      21.6   %    20 - 24  Est. FW:    1822  gm           4 lb     91  % ---------------------------------------------------------------------- OB History  Gravidity:    1 ---------------------------------------------------------------------- Gestational Age  LMP:           30w 0d        Date:  06/26/18                 EDD:   04/02/19  U/S Today:     31w 1d                                        EDD:   03/25/19  Best:          30w 0d     Det. By:  LMP  (06/26/18)          EDD:   04/02/19 ---------------------------------------------------------------------- Anatomy  Cranium:               Appears normal         LVOT:                   Previously seen  Cavum:                 Previously seen  Aortic Arch:            Previously seen  Ventricles:            Appears normal         Ductal Arch:            Previously seen  Choroid Plexus:        Previously seen        Diaphragm:              Appears  normal  Cerebellum:            Previously seen        Stomach:                Appears normal, left                                                                        sided  Posterior Fossa:       Previously seen        Abdomen:                Appears normal  Nuchal Fold:           Previously seen        Abdominal Wall:         Previously seen  Face:                  Orbits and profile     Cord Vessels:           Previously seen                         previously seen  Lips:                  Appears normal         Kidneys:                Appear normal  Palate:                Previously seen        Bladder:                Appears normal  Thoracic:              Appears normal         Spine:                  Previously seen  Heart:                 Appears normal         Upper Extremities:      Previously seen                         (4CH, axis, and                         situs)  RVOT:                  Previously seen        Lower Extremities:      Previously seen  Other:  Technically difficult due to maternal habitus and fetal position. Fetus          appears to be female. ---------------------------------------------------------------------- Cervix Uterus Adnexa  Cervix  Not visualized (advanced GA >24wks) ---------------------------------------------------------------------- Comments  This patient was seen for a follow up growth scan due to  maternal obesity. She denies any problems since her last  exam.  She was informed that the fetal growth and amniotic fluid  level appears appropriate for her gestational age.  A follow up exam was scheduled in 5 weeks. ----------------------------------------------------------------------                   Ma RingsVictor Fang, MD Electronically Signed Final Report   01/22/2019 01:56 pm ----------------------------------------------------------------------   Assessment and Plan:  Pregnancy: G1P0 at 164w0d 1. Encounter for supervision of normal first pregnancy in third trimester   2. Abnormal chromosomal and genetic finding on antenatal screening mother  3. Backache symptom Rx: - Elastic Bandages & Supports (COMFORT FIT MATERNITY SUPP SM) MISC; Wear as directed.  Dispense: 1 each; Refill: 0   Preterm labor symptoms and general obstetric precautions including but not limited to vaginal bleeding, contractions, leaking of fluid and fetal movement were reviewed in detail with the patient. I discussed the assessment and treatment plan with the patient. The patient was provided an opportunity to ask questions and all were answered. The patient agreed with the plan and demonstrated an understanding of the instructions. The patient was advised to call back or seek an in-person office evaluation/[facility] for any urgent or concerning symptoms. Please refer to After Visit Summary for other counseling recommendations.   I provided 10 minutes of face-to-face time during this encounter.  Return in about 2 weeks (around 02/19/2019) for MyChart.  Future Appointments  Date Time Provider Department Center  02/26/2019  1:45 PM [facility] NURSE [facility] MFC-US  02/26/2019  1:45 PM [facility] US 2 WH-MFCUS MFC-US    Coral Ceoharles Harper, MD [facility] 02/05/2019

## 2019-02-05 NOTE — Progress Notes (Signed)
Virtual ROB   CC: None  

## 2019-02-19 ENCOUNTER — Telehealth (INDEPENDENT_AMBULATORY_CARE_PROVIDER_SITE_OTHER): Payer: Medicaid Other | Admitting: Obstetrics

## 2019-02-19 ENCOUNTER — Encounter: Payer: Self-pay | Admitting: Obstetrics

## 2019-02-19 ENCOUNTER — Other Ambulatory Visit: Payer: Self-pay

## 2019-02-19 DIAGNOSIS — Z3403 Encounter for supervision of normal first pregnancy, third trimester: Secondary | ICD-10-CM

## 2019-02-19 DIAGNOSIS — Z3A34 34 weeks gestation of pregnancy: Secondary | ICD-10-CM

## 2019-02-25 ENCOUNTER — Encounter: Payer: Self-pay | Admitting: Obstetrics

## 2019-02-25 NOTE — Progress Notes (Signed)
   TELEHEALTH OBSTETRICS PRENATAL VIRTUAL VIDEO VISIT ENCOUNTER NOTE  Provider location: [facility] for [place] [facility] [place]   I connected with [place] on 02/25/19 at  2:00 PM EST by OB MyChart Video Encounter at home and verified that I am speaking with the correct person using two identifiers.   I discussed the limitations, risks, security and privacy concerns of performing an evaluation and management service virtually and the availability of in person appointments. I also discussed with the patient that there may be a patient responsible charge related to this service. The patient expressed understanding and agreed to proceed. Subjective:  Connie Fisher is a 23 y.o. G1P0 at [redacted]w[redacted]d being seen today for ongoing prenatal care.  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  Patient reports no complaints.  Contractions: Irritability. Vag. Bleeding: None.  Movement: Present. Denies any leaking of fluid.   The following portions of the patient's history were reviewed and updated as appropriate: allergies, current medications, past family history, past medical history, past social history, past surgical history and problem list.   Objective:   Vitals:   02/19/19 1407  BP: 110/70    Fetal Status:     Movement: Present     General:  Alert, oriented and cooperative. Patient is in no acute distress.  Respiratory: Normal respiratory effort, no problems with respiration noted  Mental Status: Normal mood and affect. Normal behavior. Normal judgment and thought content.  Rest of physical exam deferred due to type of encounter  Imaging: No results found.  Assessment and Plan:  Pregnancy: G1P0 at [redacted]w[redacted]d 1. Encounter for supervision of normal first pregnancy in third  trimester   Preterm labor symptoms and general obstetric precautions including but not limited to vaginal bleeding, contractions, leaking of fluid and fetal movement were reviewed in detail with the patient. I discussed the assessment and treatment plan with the patient. The patient was provided an opportunity to ask questions and all were answered. The patient agreed with the plan and demonstrated an understanding of the instructions. The patient was advised to call back or seek an in-person office evaluation/go to MAU at [facility] for any urgent or concerning symptoms. Please refer to After Visit Summary for other counseling recommendations.   I provided 10 minutes of face-to-face time during this encounter.  Return in about 2 weeks (around 03/05/2019) for ROB.  Future Appointments  Date Time Provider [place]  02/26/2019  1:45 PM [facility] NURSE [facility]-US  02/26/2019  1:45 PM [place] 2 [facility]-MFCUS MFC-US  03/05/2019  2:15 PM Truett Mainland, DO [facility] None    Baltazar Najjar, [place] for [facility] [place] [facility] 02/25/2019

## 2019-02-26 ENCOUNTER — Other Ambulatory Visit: Payer: Self-pay

## 2019-02-26 ENCOUNTER — Encounter (HOSPITAL_COMMUNITY): Payer: Self-pay | Admitting: *Deleted

## 2019-02-26 ENCOUNTER — Ambulatory Visit (HOSPITAL_COMMUNITY): Payer: Medicaid Other | Admitting: *Deleted

## 2019-02-26 ENCOUNTER — Ambulatory Visit (HOSPITAL_COMMUNITY)
Admission: RE | Admit: 2019-02-26 | Discharge: 2019-02-26 | Disposition: A | Discharge status: Home / Self Care | Payer: Medicaid Other | Source: Ambulatory Visit | Attending: Obstetrics and Gynecology | Admitting: Obstetrics and Gynecology

## 2019-02-26 DIAGNOSIS — Z362 Encounter for other antenatal screening follow-up: Secondary | ICD-10-CM | POA: Diagnosis not present

## 2019-02-26 DIAGNOSIS — Z6841 Body Mass Index (BMI) 40.0 and over, adult: Secondary | ICD-10-CM

## 2019-02-26 DIAGNOSIS — O360130 Maternal care for anti-D [Rh] antibodies, third trimester, not applicable or unspecified: Secondary | ICD-10-CM | POA: Diagnosis not present

## 2019-02-26 DIAGNOSIS — E669 Obesity, unspecified: Secondary | ICD-10-CM | POA: Insufficient documentation

## 2019-02-26 DIAGNOSIS — Z148 Genetic carrier of other disease: Secondary | ICD-10-CM | POA: Diagnosis not present

## 2019-02-26 DIAGNOSIS — O99213 Obesity complicating pregnancy, third trimester: Secondary | ICD-10-CM | POA: Insufficient documentation

## 2019-02-26 DIAGNOSIS — Z3A35 35 weeks gestation of pregnancy: Secondary | ICD-10-CM | POA: Diagnosis not present

## 2019-02-26 DIAGNOSIS — O26893 Other specified pregnancy related conditions, third trimester: Secondary | ICD-10-CM | POA: Diagnosis not present

## 2019-02-26 DIAGNOSIS — Z3403 Encounter for supervision of normal first pregnancy, third trimester: Secondary | ICD-10-CM

## 2019-03-05 ENCOUNTER — Other Ambulatory Visit (HOSPITAL_COMMUNITY)
Admission: RE | Admit: 2019-03-05 | Discharge: 2019-03-05 | Disposition: A | Discharge status: Home / Self Care | Payer: Medicaid Other | Source: Ambulatory Visit | Attending: Family Medicine | Admitting: Family Medicine

## 2019-03-05 ENCOUNTER — Other Ambulatory Visit: Payer: Self-pay

## 2019-03-05 ENCOUNTER — Ambulatory Visit (INDEPENDENT_AMBULATORY_CARE_PROVIDER_SITE_OTHER): Payer: Medicaid Other | Admitting: Family Medicine

## 2019-03-05 VITALS — BP 116/77 | HR 114 | Wt 298.3 lb

## 2019-03-05 DIAGNOSIS — O285 Abnormal chromosomal and genetic finding on antenatal screening of mother: Secondary | ICD-10-CM

## 2019-03-05 DIAGNOSIS — O26893 Other specified pregnancy related conditions, third trimester: Secondary | ICD-10-CM

## 2019-03-05 DIAGNOSIS — Z3A36 36 weeks gestation of pregnancy: Secondary | ICD-10-CM

## 2019-03-05 DIAGNOSIS — Z6791 Unspecified blood type, Rh negative: Secondary | ICD-10-CM

## 2019-03-05 DIAGNOSIS — Z3403 Encounter for supervision of normal first pregnancy, third trimester: Secondary | ICD-10-CM | POA: Insufficient documentation

## 2019-03-05 NOTE — Progress Notes (Signed)
Patient reports fetal movement with irregular contractions. 

## 2019-03-05 NOTE — Progress Notes (Signed)
   PRENATAL VISIT NOTE  Subjective:  Connie Fisher is a 23 y.o. G1P0 at [redacted]w[redacted]d being seen today for ongoing prenatal care.  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  Patient reports occasional contractions.  Contractions: Irregular. Vag. Bleeding: None.  Movement: Present. Denies leaking of fluid.   The following portions of the patient's history were reviewed and updated as appropriate: allergies, current medications, past family history, past medical history, past social history, past surgical history and problem list.   Objective:   Vitals:   03/05/19 1421  BP: 116/77  Pulse: (!) 114  Weight: 298 lb 4.8 oz (135.3 kg)    Fetal Status: Fetal Heart Rate (bpm): 145(Simultaneous filing. User may not have seen previous data.) Fundal Height: 37 cm Movement: Present  Presentation: Vertex  General:  Alert, oriented and cooperative. Patient is in no acute distress.  Skin: Skin is warm and dry. No rash noted.   Cardiovascular: Normal heart rate noted  Respiratory: Normal respiratory effort, no problems with respiration noted  Abdomen: Soft, gravid, appropriate for gestational age.  Pain/Pressure: Absent     Pelvic: Cervical exam performed Dilation: 2 Effacement (%): 70 Station: -2  Extremities: Normal range of motion.  Edema: None  Mental Status: Normal mood and affect. Normal behavior. Normal judgment and thought content.   Assessment and Plan:  Pregnancy: G1P0 at [redacted]w[redacted]d 1. Encounter for supervision of normal first pregnancy in third trimester FHT and FH normal  2. Rh negative state in antepartum period Rhogam given at 27 weeks  3. 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 Please refer to After Visit Summary for other counseling recommendations.   Return in about 1 week (around 03/12/2019) for OB f/u, Virtual.  No future appointments.  Truett Mainland, DO

## 2019-03-06 LAB — CERVICOVAGINAL ANCILLARY ONLY
Chlamydia: NEGATIVE
Comment: NEGATIVE
Comment: NORMAL
Neisseria Gonorrhea: NEGATIVE

## 2019-03-07 LAB — STREP GP B NAA: Strep Gp B NAA: NEGATIVE

## 2019-03-08 NOTE — L&D Delivery Note (Addendum)
OB/GYN Faculty Practice Delivery Note  Connie Fisher is a 24 y.o. G1P0 s/p SVD at [redacted]w[redacted]d. She was admitted for active labor.   ROM: 16h 77m with clear fluid GBS Status: --Theda Sers (12/29 0241)   Labor Progress: Patient presented to L&D for active labor. Initial SVE: 4. She then progressed to complete, with pitocin.   Delivery Date/Time: 03/24/2019 @ 1317 Delivery: Called to room and patient was complete and pushing. Head delivered without difficulty. No nuchal cord present. Shoulder delivery with gentle downward traction and body delivered in usual fashion. Infant with spontaneous cry, placed on mother's abdomen, dried and stimulated. Cord clamped x 2 after 1-minute delay, and cut by FOB. Cord blood drawn. Placenta delivered spontaneously with gentle cord traction. Fundus initially boggy.  Massage, Pitocin, Cytotec 800 mcg PO, and methergine 0.52mcg IM administered.  Manual uterine sweep performed, clots manually removed.  Fundus now firm and continues to remain firm. Labia, perineum, vagina, and cervix inspected inspected, second degree laceration present and repair with 3-0 vicryl.  Mother and baby stable and bonding.    Placenta: Sent to L&D Complications: Increased bleeding post vaginal delivery Lacerations: second degree laceration EBL: 630 Analgesia: Epidural  Infant: APGAR (1 MIN): 9    [name], SNM, RNC-OB    Patient is a 24 y.o. at [redacted]w[redacted]d who was admitted for SOL/SROM, uncomplicated prenatal course.  She progressed with augmentation via pitocin.  I was gloved and present for delivery in its entirety.  Second stage of labor progressed, baby delivered after 1.5 hours pushing.   Rolm Bookbinder, CNM 2:09 PM

## 2019-03-12 ENCOUNTER — Telehealth (INDEPENDENT_AMBULATORY_CARE_PROVIDER_SITE_OTHER): Payer: Medicaid Other | Admitting: Advanced Practice Midwife

## 2019-03-12 DIAGNOSIS — Z3A37 37 weeks gestation of pregnancy: Secondary | ICD-10-CM

## 2019-03-12 DIAGNOSIS — Z3403 Encounter for supervision of normal first pregnancy, third trimester: Secondary | ICD-10-CM

## 2019-03-12 NOTE — Progress Notes (Signed)
   TELEHEALTH OBSTETRICS PRENATAL VIRTUAL VIDEO VISIT ENCOUNTER NOTE  Provider location: [facility] [place]   I connected with [name] on 03/12/19 at  1:40 PM EST by MyChart Video Encounter at home and verified that I am speaking with the correct person using two identifiers.   I discussed the limitations, risks, security and privacy concerns of performing an evaluation and management service virtually and the availability of in person appointments. I also discussed with the patient that there may be a patient responsible charge related to this service. The patient expressed understanding and agreed to proceed. Subjective:  Connie Fisher is a 24 y.o. G1P0 at [redacted]w[redacted]d being seen today for ongoing prenatal care.  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  Patient reports occasional contractions.  Contractions: Irregular. Vag. Bleeding: Scant.  Movement: Present. Denies any leaking of fluid.   The following portions of the patient's history were reviewed and updated as appropriate: allergies, current medications, past family history, past medical history, past social history, past surgical history and problem list.   Objective:   Vitals:   03/12/19 1343 03/12/19 1434  BP: (!) 112/99 124/77  Pulse: (!) 105     Fetal Status:     Movement: Present     General:  Alert, oriented and cooperative. Patient is in no acute distress.  Respiratory: Normal respiratory effort, no problems with respiration noted  Mental Status: Normal mood and affect. Normal behavior. Normal judgment and thought content.  Rest of physical exam deferred due to type of encounter  Imaging:   Assessment and Plan:  Pregnancy: G1P0 at [redacted]w[redacted]d  1. Encounter for supervision of normal  first pregnancy in third trimester --Pt reports good fetal movement, denies cramping, LOF --Reports light spotting 2 days ago and mucus with very light dark bleeding yesterday, scant today.  This is c/w mucus plug, labor readiness but reviewed bleeding precautions with pt/reasons to seek care. --Initial BP taken after pt walked up stairs.  No s/sx of PEC. Retake wnl.  Pt to take weekly and enter into Babyscripts.  PEC precautions reviewed.  --Next appt in office in 2 weeks  Term labor symptoms and general obstetric precautions including but not limited to vaginal bleeding, contractions, leaking of fluid and fetal movement were reviewed in detail with the patient. I discussed the assessment and treatment plan with the patient. The patient was provided an opportunity to ask questions and all were answered. The patient agreed with the plan and demonstrated an understanding of the instructions. The patient was advised to call back or seek an in-person office evaluation/go to MAU at [facility] for any urgent or concerning symptoms. Please refer to After Visit Summary for other counseling recommendations.   I provided 10 minutes of face-to-face time during this encounter.  No follow-ups on file.  Future Appointments  Date Time Provider Department Center  03/19/2019 10:45 AM Gerrit Heck, CNM [facility] None  03/26/2019  9:55 AM [place], Wilmer Floor, CNM [facility] None  04/02/2019  8:45 AM [place], Margarette Asal, DO [facility] None    Sharen Counter, CNM Center for [employer], [facility]

## 2019-03-19 ENCOUNTER — Telehealth (INDEPENDENT_AMBULATORY_CARE_PROVIDER_SITE_OTHER): Payer: Medicaid Other

## 2019-03-19 ENCOUNTER — Other Ambulatory Visit: Payer: Self-pay

## 2019-03-19 ENCOUNTER — Inpatient Hospital Stay (HOSPITAL_COMMUNITY)
Admission: AD | Admit: 2019-03-19 | Discharge: 2019-03-19 | Disposition: A | Discharge status: Home / Self Care | Payer: Medicaid Other | Attending: Obstetrics and Gynecology | Admitting: Obstetrics and Gynecology

## 2019-03-19 ENCOUNTER — Encounter (HOSPITAL_COMMUNITY): Payer: Self-pay | Admitting: Obstetrics and Gynecology

## 2019-03-19 VITALS — BP 122/83 | HR 101

## 2019-03-19 DIAGNOSIS — Z3A38 38 weeks gestation of pregnancy: Secondary | ICD-10-CM | POA: Diagnosis not present

## 2019-03-19 DIAGNOSIS — R030 Elevated blood-pressure reading, without diagnosis of hypertension: Secondary | ICD-10-CM

## 2019-03-19 DIAGNOSIS — O368130 Decreased fetal movements, third trimester, not applicable or unspecified: Secondary | ICD-10-CM

## 2019-03-19 DIAGNOSIS — Z3403 Encounter for supervision of normal first pregnancy, third trimester: Secondary | ICD-10-CM

## 2019-03-19 DIAGNOSIS — O26893 Other specified pregnancy related conditions, third trimester: Secondary | ICD-10-CM | POA: Insufficient documentation

## 2019-03-19 LAB — COMPREHENSIVE METABOLIC PANEL
ALT: 16 U/L (ref 0–44)
AST: 20 U/L (ref 15–41)
Albumin: 2.6 g/dL — ABNORMAL LOW (ref 3.5–5.0)
Alkaline Phosphatase: 134 U/L — ABNORMAL HIGH (ref 38–126)
Anion gap: 8 (ref 5–15)
BUN: <5 mg/dL — ABNORMAL LOW (ref 6–20)
CO2: 20 mmol/L — ABNORMAL LOW (ref 22–32)
Calcium: 9 mg/dL (ref 8.9–10.3)
Chloride: 109 mmol/L (ref 98–111)
Creatinine, Ser: 0.66 mg/dL (ref 0.44–1.00)
GFR calc Af Amer: >60 mL/min (ref >60)
GFR calc non Af Amer: >60 mL/min (ref >60)
Glucose, Bld: 96 mg/dL (ref 70–99)
Potassium: 3.9 mmol/L (ref 3.5–5.1)
Sodium: 137 mmol/L (ref 135–145)
Total Bilirubin: 0.4 mg/dL (ref 0.3–1.2)
Total Protein: 5.9 g/dL — ABNORMAL LOW (ref 6.5–8.1)

## 2019-03-19 LAB — CBC
HCT: 37.5 % (ref 36.0–46.0)
Hemoglobin: 12.6 g/dL (ref 12.0–15.0)
MCH: 30.4 pg (ref 26.0–34.0)
MCHC: 33.6 g/dL (ref 30.0–36.0)
MCV: 90.6 fL (ref 80.0–100.0)
Platelets: 164 10*3/uL (ref 150–400)
RBC: 4.14 MIL/uL (ref 3.87–5.11)
RDW: 13.9 % (ref 11.5–15.5)
WBC: 13.3 10*3/uL — ABNORMAL HIGH (ref 4.0–10.5)
nRBC: 0 % (ref 0.0–0.2)

## 2019-03-19 LAB — PROTEIN / CREATININE RATIO, URINE
Creatinine, Urine: 245.73 mg/dL
Protein Creatinine Ratio: 0.09 mg/mg{Cre} (ref 0.00–0.15)
Total Protein, Urine: 23 mg/dL

## 2019-03-19 NOTE — MAU Note (Signed)
Had virtual appt today.  First BP was elevated; reports mild HA (still not taken anything)denies visual changes or epigastric pain, reports some swelling in ankles. No bleeding or leaking.  Some low back pain. Baby moved once after she ate. Last 4 days has not been been moving as usual.

## 2019-03-19 NOTE — MAU Provider Note (Signed)
History     CSN: [id number]  Arrival date and time: 03/19/19 1252   First Provider Initiated Contact with Patient 03/19/19 1351      Chief Complaint  Patient presents with  . Decreased Fetal Movement  . Headache  . Hypertension   HPI  Ms.  Connie Fisher is a 24 y.o. year old G72P0 female at 72w0dweeks gestation who presents to MAU reporting she had an elevated BP during a virtual visit this morning; 126/92. She retook the BP and it was normal. She complains of a H/A and feeling dizzy, but "thinks it is from being very tired. Not much sleep lately." She also reported to the CNM that she had not felt the baby move in several days. She was then instructed to come to MAU for PEC work-up and EFM for DFM. She was also told to eat something before she comes to MAU. She ate around 1130 this AM and reports "feeling a little FM after eating." She reports to MAU provider that she noticed DFM on 03/15/2019, but "decided to wait until her appointment today to tell her provider." She reports she is feeling more movement now since being in MAU. She had one other elevated BP on 03/12/2019; 112/99.   Past Medical History:  Diagnosis Date  . Heart murmur    AS [facility] BABY  [facility]     Past Surgical History:  Procedure Laterality Date  . NO PAST SURGERIES      Family History  Problem Relation Age of Onset  . Deep vein thrombosis Mother   . Kidney Stones Mother   . Diabetes Father   . Asthma Brother   . Arthritis Paternal Uncle   . Cancer Paternal Uncle   . Diabetes Paternal Grandmother     Social History   Tobacco Use  . Smoking status: Never Smoker  . Smokeless tobacco: Never Used  Substance Use Topics  . Alcohol use: Not Currently    Comment: occ, events  . Drug use: Never    Allergies: No Known Allergies  Medications Prior to Admission  Medication Sig Dispense Refill Last Dose  . Prenatal Vit w/Fe-Methylfol-FA (PNV PO) Take by mouth.   03/18/2019 at Unknown time  .  aspirin EC 81 MG tablet Take 1 tablet (81 mg total) by mouth daily. Take after 12 weeks for prevention of preeclampsia later in pregnancy 300 tablet 2 More than a month at Unknown time  . Blood Pressure Monitor KIT 1 kit by Does not apply route once a week. Check BP weekly.  Large Cuff. DX:  Z13.6         Z34.86 1 kit 0   . cyclobenzaprine (FLEXERIL) 5 MG tablet Take 1 tablet (5 mg total) by mouth 3 (three) times daily as needed for muscle spasms. 15 tablet 0 More than a month at Unknown time  . Elastic Bandages & Supports (COMFORT FIT MATERNITY SUPP SM) MISC Wear as directed. 1 each 0   . omeprazole (PRILOSEC) 20 MG capsule Take 1 capsule (20 mg total) by mouth daily. 30 capsule 1 More than a month at Unknown time    Review of Systems  Constitutional: Negative.   HENT: Negative.   Eyes: Negative.   Respiratory: Negative.   Cardiovascular: Negative.   Gastrointestinal: Negative.   Endocrine: Negative.   Genitourinary:       DFM since 03/15/19, but feeling FM now  Musculoskeletal: Negative.   Skin: Negative.   Allergic/Immunologic: Negative.  Neurological: Positive for dizziness (not much) and headaches (none now).  Hematological: Negative.   Psychiatric/Behavioral: Negative.    Physical Exam     12/21 1445  --  97  --  --  111/75  Semi-fowlers  99 %  --  --  --  -- FM   03/19/19 1431  --  108Abnormal   --  --  106/85  Semi-fowlers  --  --  --  --  -- FM   03/19/19 1415  --  107Abnormal   --  --  118/70  --  99 %  --  --  --  -- FM   03/19/19 1401  --  113Abnormal   --  --  116/69  --  --  --  --  --  -- JS   03/19/19 1345  --  85  --  --  111/78  --  99 %  --  --  --  -- RD   03/19/19 1335  --  111Abnormal   --  --  106/62  --  98 %  --  --  --  -- JS   03/19/19 1310  98.1 F (36.7 C)  113Abnormal   --  18  131/77  Sitting  100 %  --  --  --  138.3 kgAbnormal  JS     Physical Exam  Nursing note and vitals reviewed. Constitutional: She is oriented to person, place, and time.  She appears well-developed and well-nourished.  HENT:  Head: Normocephalic and atraumatic.  Eyes: Pupils are equal, round, and reactive to light.  Respiratory: Effort normal and breath sounds normal.  GI: Soft. Bowel sounds are normal.  Genitourinary:    Genitourinary Comments: Pelvic not indicated   Musculoskeletal:        General: Normal range of motion.     Cervical back: Normal range of motion.  Neurological: She is alert and oriented to person, place, and time. She has normal reflexes.  Skin: Skin is warm and dry.  Psychiatric: She has a normal mood and affect. Her behavior is normal. Judgment and thought content normal.   NST - FHR: 135 bpm / moderate variability / accels present / decels absent / TOCO: none   MAU Course  Procedures  MDM CCUA CBC CMP P/C Ratio Serial BP's  Offered Tylenol - declined, not needed  Results for orders placed or performed during the hospital encounter of 03/19/19 (from the past 24 hour(s))  Protein / creatinine ratio, urine     Status: None   Collection Time: 03/19/19  1:10 PM  Result Value Ref Range   Creatinine, Urine 245.73 mg/dL   Total Protein, Urine 23 mg/dL   Protein Creatinine Ratio 0.09 0.00 - 0.15 mg/mg[Cre]  CBC     Status: Abnormal   Collection Time: 03/19/19  1:21 PM  Result Value Ref Range   WBC 13.3 (H) 4.0 - 10.5 K/uL   RBC 4.14 3.87 - 5.11 MIL/uL   Hemoglobin 12.6 12.0 - 15.0 g/dL   HCT 37.5 36.0 - 46.0 %   MCV 90.6 80.0 - 100.0 fL   MCH 30.4 26.0 - 34.0 pg   MCHC 33.6 30.0 - 36.0 g/dL   RDW 13.9 11.5 - 15.5 %   Platelets 164 150 - 400 K/uL   nRBC 0.0 0.0 - 0.2 %  Comprehensive metabolic panel     Status: Abnormal   Collection Time: 03/19/19  1:21 PM  Result Value Ref Range   Sodium 137 135 -  145 mmol/L   Potassium 3.9 3.5 - 5.1 mmol/L   Chloride 109 98 - 111 mmol/L   CO2 20 (L) 22 - 32 mmol/L   Glucose, Bld 96 70 - 99 mg/dL   BUN <5 (L) 6 - 20 mg/dL   Creatinine, Ser 0.66 0.44 - 1.00 mg/dL   Calcium 9.0 8.9  - 10.3 mg/dL   Total Protein 5.9 (L) 6.5 - 8.1 g/dL   Albumin 2.6 (L) 3.5 - 5.0 g/dL   AST 20 15 - 41 U/L   ALT 16 0 - 44 U/L   Alkaline Phosphatase 134 (H) 38 - 126 U/L   Total Bilirubin 0.4 0.3 - 1.2 mg/dL   GFR calc non Af Amer >60 >60 mL/min   GFR calc Af Amer >60 >60 mL/min   Anion gap 8 5 - 15    *Consult with Dr. Ilda Basset @ 1454 - notified of patient's complaints, assessments, lab & NST results, recommended tx plan d/c home with BP check on Thursday or Friday  - ok to d/c home  Assessment and Plan  Elevated blood pressure reading without diagnosis of hypertension  - Information provided on HTN during pregnancy and PEC - Advised to return to MAU for BPs 160/110 or greater, worsening H/A without relief, blurry vision or DFM/no FM.  - Discharge patient - Msg sent to [facility] to schedule for BP check on 1/14/or 1/15 - Patient verbalized an understanding of the plan of care and agrees.    Laury Deep, MSN, CNM 03/19/2019, 2:05 PM

## 2019-03-19 NOTE — Progress Notes (Signed)
TELEHEALTH OBSTETRICS PRENATAL VIRTUAL VIDEO VISIT ENCOUNTER NOTE  Provider location: [facility] [place]   I connected with Connie Fisher on 03/19/19 at 10:45 AM EST by MyChart Video Encounter at home and verified that I am speaking with the correct person using two identifiers.   I discussed the limitations, risks, security and privacy concerns of performing an evaluation and management service virtually and the availability of in person appointments. I also discussed with the patient that there may be a patient responsible charge related to this service. The patient expressed understanding and agreed to proceed. Subjective:  Connie Fisher is a 24 y.o. G1P0 at [redacted]w[redacted]d being seen today for ongoing prenatal care.  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  Patient reports fatigue.  Contractions: Irritability. Vag. Bleeding: None.  Movement: Present. Denies any leaking of fluid. Patient denies contractions, but reports some pelvic pressure.  She reports that she has not felt baby move since "yesterday around midafternoon."  But then goes on to state that it was around 7pm.  She reports that she has not eaten anything today, but did have some coffee with flavored creamer.  Patient reports that her initial blood pressure was 126/92, but repeat was normal. Patient endorses headache, but states "I just think it was due to the exhaustion."  She states it did not take medication, but it did not resolve until waking up this morning.    The following portions of the patient's history were reviewed and updated as appropriate: allergies, current medications, past family history, past medical history, past social history, past surgical history and problem list.   Objective:    Vitals:   03/19/19 1046  BP: 122/83  Pulse: (!) 101    Fetal Status:     Movement: Present     General:  Alert, oriented and cooperative. Patient is in no acute distress.  Respiratory: Normal respiratory effort, no problems with respiration noted  Mental Status: Normal mood and affect. Normal behavior. Normal judgment and thought content.  Rest of physical exam deferred due to type of encounter  Imaging: [place] MFM OB FOLLOW UP  Result Date: 02/26/2019 ----------------------------------------------------------------------  OBSTETRICS REPORT                       (Signed Final 02/26/2019 02:12 pm) ---------------------------------------------------------------------- Patient Info  ID #:       [id number]                          D.O.B.:  [birth year] (23 yrs)  Name:       Connie Fisher                  Visit Date: 02/26/2019 01:45 pm ---------------------------------------------------------------------- Performed By  Performed By:     Percell Boston          Ref. Address:     [address]                    [facility]  [address]                                                             [place], [place]                                                             [address]  Attending:        Ma Rings MD         [facility]  Referred By:      Tereso Newcomer MD ---------------------------------------------------------------------- Orders   #  Description                          Code         Ordered By   1  [place] MFM OB FOLLOW UP                  42706.23     Rosana Hoes  ----------------------------------------------------------------------   #  Order #                    Accession #                 Episode #   1  [id number]                  [id number]                  [id number]  ----------------------------------------------------------------------  Indications   Obesity complicating pregnancy, third          O99.213   trimester   [redacted] weeks gestation of pregnancy                Z3A.35   Rh negative state in antepartum                O36.0190   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 Cord Insertion:      Previously Visualized  Amniotic Fluid  AFI FV:      Within normal limits  AFI Sum(cm)     %Tile       Largest Pocket(cm)  15.9            58          5.05  RUQ(cm)       RLQ(cm)       LUQ(cm)        LLQ(cm)  3.6           3.95          3.3            5.05 ---------------------------------------------------------------------- Biometry  BPD:      84.5  mm     G. Age:  34w 0d         25  %    CI:        74.77   %    70 - 86                                                          FL/HC:      23.3   %    20.1 - 22.3  HC:      310.1  mm     G. Age:  34w 5d         12  %    HC/AC:      0.89        0.93 - 1.11  AC:      347.5  mm     G. Age:  38w 5d       > 99  %    FL/BPD:     85.6   %    71 - 87  FL:       72.3  mm     G. Age:  37w 0d         88  %    FL/AC:      20.8   %    20 - 24  HUM:      62.8  mm     G. Age:  36w 3d         93  %  Est. FW:    3154  gm    6 lb 15 oz      96  % ---------------------------------------------------------------------- OB History  Gravidity:    1 ---------------------------------------------------------------------- Gestational Age  LMP:           35w 0d        Date:  06/26/18                 EDD:   04/02/19  U/S Today:      36w 1d  EDD:   03/25/19  Best:          Consuello Closs35w 0d     Det. By:  LMP  (06/26/18)          EDD:   04/02/19 ---------------------------------------------------------------------- Anatomy  Cranium:               Appears normal         LVOT:                   Previously seen  Cavum:                 Previously seen        Aortic Arch:            Previously seen  Ventricles:            Appears normal         Ductal Arch:            Previously seen  Choroid Plexus:        Previously seen        Diaphragm:              Previously seen  Cerebellum:            Previously seen        Stomach:                Appears normal, left                                                                        sided  Posterior Fossa:       Previously seen        Abdomen:                Previously seen  Nuchal Fold:           Previously seen        Abdominal Wall:         Previously seen  Face:                  Orbits and profile     Cord Vessels:           Previously seen                         previously seen  Lips:                  Previously seen        Kidneys:                Previously seen  Palate:                Previously seen        Bladder:                Appears normal  Thoracic:              Appears normal         Spine:                  Previously seen  Heart:  Appears normal         Upper Extremities:      Previously seen                         (4CH, axis, and                         situs)  RVOT:                  Previously seen        Lower Extremities:      Previously seen  Other:  Technically difficult due to maternal habitus and fetal position. Fetus          appears to be female. ---------------------------------------------------------------------- Cervix Uterus Adnexa  Cervix  Not visualized (advanced GA >24wks)  Uterus  No abnormality visualized.  Left Ovary  No adnexal mass visualized.  Right Ovary  No adnexal mass visualized.  Cul De Sac  No free fluid seen.   Adnexa  No abnormality visualized. ---------------------------------------------------------------------- Comments  This patient was seen for a follow up growth scan due to due  to maternal obesity.  She denies any problems since her last  exam.  She was informed that the fetal growth and amniotic fluid  level appears appropriate for her gestational age.  Follow-up as indicated. ----------------------------------------------------------------------                   Ma Rings, MD Electronically Signed Final Report   02/26/2019 02:12 pm ----------------------------------------------------------------------   Assessment and Plan:  Pregnancy: G1P0 at [redacted]w[redacted]d  1. Encounter for supervision of normal first pregnancy in third trimester  2. Decreased fetal movements in third trimester, single or unspecified fetus -Informed that movement does decrease, but should still be apparent especially after eating and/or drinking. -Instructed to report to MAU for evaluation to include FM. -Instructed to eat prior to going to MAU.  -Informed that if no movement perceived could possibly be admitted for IOL.   3. Elevated BP without diagnosis of hypertension -Review of blood pressures show elevated diastolic (90s), but repeats are normal range. -Provider unable to review babyscripts blood pressures.  -Patient informed of these finding and then discloses that also occurred today as initial 126/92, but repeat 122/83. -Instructed to report to MAU for PIH work up and provider and MD would decide on admission for IOL. -Patient instructed to back and take overnight bag. -Informed that if admission does not occur, office would contact her to schedule appt for next week; in office.  -Patient information, status, and current complaints called to MAU provider, M.Bhambri, CNM.  Term labor symptoms and general obstetric precautions including but not limited to vaginal bleeding, contractions, leaking of fluid and fetal movement  were reviewed in detail with the patient. I discussed the assessment and treatment plan with the patient. The patient was provided an opportunity to ask questions and all were answered. The patient agreed with the plan and demonstrated an understanding of the instructions. The patient was advised to call back or seek an in-person office evaluation/go to [facility]U at [facility] for any urgent or concerning symptoms. Please refer to After Visit Summary for other counseling recommendations.   I provided 7 minutes of face-to-face time during this encounter.  No follow-ups on file.  Future Appointments  Date Time Provider Department Center  03/26/2019  9:55 AM Leftwich-Kirby, Wilmer Floor, CNM [facility] None  04/02/2019  8:45 AM  Sparacino, Hailey L, DO [facility] None    Cherre RobinsJessica L Rossi Silvestro, CNM [facility]

## 2019-03-19 NOTE — Progress Notes (Signed)
Virtual OB   CC; Decreased Fetal Movement since Friday  Pt has tried coffee today to try to get baby to move per pt still no movement.  B/P 122/83 P:101

## 2019-03-22 ENCOUNTER — Other Ambulatory Visit: Payer: Self-pay

## 2019-03-22 ENCOUNTER — Ambulatory Visit (INDEPENDENT_AMBULATORY_CARE_PROVIDER_SITE_OTHER): Payer: Medicaid Other

## 2019-03-22 VITALS — BP 114/73 | HR 109 | Wt 309.0 lb

## 2019-03-22 DIAGNOSIS — Z0130 Encounter for examination of blood pressure without abnormal findings: Secondary | ICD-10-CM

## 2019-03-22 NOTE — Progress Notes (Signed)
Subjective:  Connie Fisher is a 24 y.o. female here for BP check.   Hypertension ROS: taking medications as instructed, no medication side effects noted, no TIA's, no chest pain on exertion, no dyspnea on exertion and no swelling of ankles.    Objective:  BP 114/73   Pulse (!) 109   Wt (!) 309 lb (140.2 kg)   LMP 06/26/2018   BMI 54.74 kg/m   Appearance alert, well appearing, and in no distress. General exam BP noted to be well controlled today in office.    Assessment:   Blood Pressure well controlled.   Plan:  Current treatment plan is effective, no change in therapy.per Dr. Clearance Coots

## 2019-03-23 ENCOUNTER — Inpatient Hospital Stay (HOSPITAL_COMMUNITY)
Admission: AD | Admit: 2019-03-23 | Discharge: 2019-03-25 | DRG: 807 | Disposition: A | Discharge status: Home / Self Care | Payer: Medicaid Other | Attending: Obstetrics & Gynecology | Admitting: Obstetrics & Gynecology

## 2019-03-23 ENCOUNTER — Other Ambulatory Visit: Payer: Self-pay

## 2019-03-23 ENCOUNTER — Encounter (HOSPITAL_COMMUNITY): Payer: Self-pay | Admitting: Obstetrics and Gynecology

## 2019-03-23 DIAGNOSIS — O26893 Other specified pregnancy related conditions, third trimester: Secondary | ICD-10-CM | POA: Diagnosis not present

## 2019-03-23 DIAGNOSIS — Z6791 Unspecified blood type, Rh negative: Secondary | ICD-10-CM

## 2019-03-23 DIAGNOSIS — Z20822 Contact with and (suspected) exposure to covid-19: Secondary | ICD-10-CM | POA: Diagnosis present

## 2019-03-23 DIAGNOSIS — O360130 Maternal care for anti-D [Rh] antibodies, third trimester, not applicable or unspecified: Secondary | ICD-10-CM | POA: Diagnosis not present

## 2019-03-23 DIAGNOSIS — O99214 Obesity complicating childbirth: Secondary | ICD-10-CM | POA: Diagnosis not present

## 2019-03-23 DIAGNOSIS — Z3400 Encounter for supervision of normal first pregnancy, unspecified trimester: Secondary | ICD-10-CM

## 2019-03-23 DIAGNOSIS — Z148 Genetic carrier of other disease: Secondary | ICD-10-CM

## 2019-03-23 DIAGNOSIS — Z3A38 38 weeks gestation of pregnancy: Secondary | ICD-10-CM | POA: Diagnosis not present

## 2019-03-23 DIAGNOSIS — O26899 Other specified pregnancy related conditions, unspecified trimester: Secondary | ICD-10-CM

## 2019-03-23 DIAGNOSIS — O99210 Obesity complicating pregnancy, unspecified trimester: Secondary | ICD-10-CM | POA: Diagnosis present

## 2019-03-23 LAB — CBC
HCT: 36.8 % (ref 36.0–46.0)
Hemoglobin: 12.4 g/dL (ref 12.0–15.0)
MCH: 30.6 pg (ref 26.0–34.0)
MCHC: 33.7 g/dL (ref 30.0–36.0)
MCV: 90.9 fL (ref 80.0–100.0)
Platelets: 176 10*3/uL (ref 150–400)
RBC: 4.05 MIL/uL (ref 3.87–5.11)
RDW: 14.4 % (ref 11.5–15.5)
WBC: 11.8 10*3/uL — ABNORMAL HIGH (ref 4.0–10.5)
nRBC: 0 % (ref 0.0–0.2)

## 2019-03-23 LAB — POCT FERN TEST: POCT Fern Test: POSITIVE

## 2019-03-23 MED ORDER — LACTATED RINGERS IV SOLN: INTRAVENOUS | Status: DC

## 2019-03-23 MED ORDER — OXYTOCIN 40 UNITS IN NORMAL SALINE INFUSION - SIMPLE MED
1.0000 m[IU]/min | INTRAVENOUS | Status: DC
  Administered 2019-03-24: 2 m[IU]/min via INTRAVENOUS
  Filled 2019-03-23: qty 1000

## 2019-03-23 MED ORDER — TERBUTALINE SULFATE 1 MG/ML IJ SOLN: 0.2500 mg | Freq: Once | INTRAMUSCULAR | Status: DC | PRN

## 2019-03-23 MED ORDER — FENTANYL CITRATE (PF) 100 MCG/2ML IJ SOLN: 100.0000 ug | INTRAMUSCULAR | Status: DC | PRN

## 2019-03-23 MED ORDER — SOD CITRATE-CITRIC ACID 500-334 MG/5ML PO SOLN: 30.0000 mL | ORAL | Status: DC | PRN

## 2019-03-23 MED ORDER — OXYTOCIN 40 UNITS IN NORMAL SALINE INFUSION - SIMPLE MED: 2.5000 [IU]/h | INTRAVENOUS | Status: DC

## 2019-03-23 MED ORDER — ONDANSETRON HCL 4 MG/2ML IJ SOLN: 4.0000 mg | Freq: Four times a day (QID) | INTRAMUSCULAR | Status: DC | PRN

## 2019-03-23 MED ORDER — ACETAMINOPHEN 325 MG PO TABS: 650.0000 mg | ORAL_TABLET | ORAL | Status: DC | PRN

## 2019-03-23 MED ORDER — LIDOCAINE HCL (PF) 1 % IJ SOLN: 30.0000 mL | INTRAMUSCULAR | Status: DC | PRN

## 2019-03-23 MED ORDER — OXYTOCIN BOLUS FROM INFUSION
500.0000 mL | Freq: Once | INTRAVENOUS | Status: AC
  Administered 2019-03-24: 500 mL via INTRAVENOUS

## 2019-03-23 MED ORDER — LACTATED RINGERS IV SOLN: 500.0000 mL | INTRAVENOUS | Status: DC | PRN

## 2019-03-23 NOTE — MAU Provider Note (Signed)
First Provider Initiated Contact with Patient 03/23/19 2229      S: [name] is a 24 y.o. G1P0 at [redacted]w[redacted]d  who presents to MAU today complaining of vaginal bleeding/fluid since around 2100- describes as light pink and occurred when she was using the restroom. She reports continuing to see light pink fluid in her underwear and when she wipes. She endorses vaginal bleeding. She denies contractions. She reports normal fetal movement.    O: BP 116/71 (BP Location: Right Arm)   Pulse (!) 122   Temp 98.9 F (37.2 C) (Oral)   Resp 20   LMP 06/26/2018   SpO2 98%  GENERAL: Well-developed, obese female in no acute distress.  HEAD: Normocephalic, atraumatic.  CHEST: Normal effort of breathing, regular heart rate ABDOMEN: Soft, nontender, gravid PELVIC: Normal external female genitalia. Vagina is pink and rugated. Cervix with normal contour, no lesions. Normal discharge.  Positive pooling- light pink.   Cervical exam:  Dilation: 4 Effacement (%): 50, 60 Station: -3 Presentation: Vertex Exam by:: Wynette Jersey,CNM   Fetal Monitoring: Baseline: 135 Variability: moderate Accelerations: present  Decelerations: none  Contractions: irritability   + Fern   A: SIUP at [redacted]w[redacted]d  SROM  P: Report given to Dr Morene Antu for admission to L&D  Sharyon Cable, CNM 03/23/2019 10:49 PM

## 2019-03-23 NOTE — MAU Note (Signed)
Pt reports to MAU for VB and cramping, she stated that she thinks some mucus is coming out with the bleeding. Confirmed fetal movement.

## 2019-03-23 NOTE — H&P (Addendum)
OBSTETRIC ADMISSION HISTORY AND PHYSICAL  Connie Fisher is a 24 y.o. female G1P0 with IUP at 71w4dby LMP c/w 7wk [place] for SROM. She reports +FMs, No LOF, no VB, no blurry vision, headaches or peripheral edema, and RUQ pain.  She plans on breast feeding. She requests POPs for birth control. 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 Heart murmur    AS A BABY  . Morbid obesity (HImpact     Past Surgical History: Past Surgical History:  Procedure Laterality Date  . NO PAST SURGERIES      Obstetrical History: OB History    Gravida  1   Para      Term      Preterm      AB      Living  0     SAB      TAB      Ectopic      Multiple      Live Births              Social History: Social History   Socioeconomic History  . Marital status: Single    Spouse name: Not on file  . Number of children: Not on file  . Years of education: Not on file  . Highest education level: Not on file  Occupational History  . Occupation: UNEMPLOYED  Tobacco Use  . Smoking status: Never Smoker  . Smokeless tobacco: Never Used  Substance and Sexual Activity  . Alcohol use: Not Currently    Comment: occ, events  . Drug use: Never  . Sexual activity: Yes  Other Topics Concern  . Not on file  Social History Narrative  . Not on file   Social Determinants of Health   Financial Resource Strain:   . Difficulty of Paying Living Expenses: Not on file  Food Insecurity:   . Worried About RCharity fundraiserin the Last Year: Not on file  . Ran Out of Food in the Last Year: Not on file  Transportation  Needs:   . Lack of Transportation (Medical): Not on file  . Lack of Transportation (Non-Medical): Not on file  Physical Activity:   . Days of Exercise per Week: Not on file  . Minutes of Exercise per Session: Not on file  Stress:   . Feeling of Stress : Not on file  Social Connections:   . Frequency of Communication with Friends and Family: Not on file  . Frequency of Social Gatherings with Friends and Family: Not on file  . Attends Religious Services: Not on file  . Active Member of Clubs or Organizations: Not on file  . Attends CArchivistMeetings: Not on file  . Marital Status: Not on file    Family History: Family History  Problem Relation Age of Onset  . Deep vein thrombosis Mother   . Kidney Stones Mother   . Diabetes Father   . Asthma Brother   . Arthritis Paternal Uncle   . Cancer Paternal Uncle   . Diabetes Paternal Grandmother     Allergies: No Known Allergies  Medications Prior to Admission  Medication Sig  Dispense Refill Last Dose  . cyclobenzaprine (FLEXERIL) 5 MG tablet Take 1 tablet (5 mg total) by mouth 3 (three) times daily as needed for muscle spasms. 15 tablet 0 Past Week at Unknown time  . Prenatal Vit w/Fe-Methylfol-FA (PNV PO) Take by mouth.   03/23/2019 at 9pm  . aspirin EC 81 MG tablet Take 1 tablet (81 mg total) by mouth daily. Take after 12 weeks for prevention of preeclampsia later in pregnancy 300 tablet 2   . Blood Pressure Monitor KIT 1 kit by Does not apply route once a week. Check BP weekly.  Large Cuff. DX:  Z13.6         Z34.86 1 kit 0   . Elastic Bandages & Supports (COMFORT FIT MATERNITY SUPP SM) MISC Wear as directed. 1 each 0   . omeprazole (PRILOSEC) 20 MG capsule Take 1 capsule (20 mg total) by mouth daily. 30 capsule 1      Review of Systems   All systems reviewed and negative except as stated in HPI  Blood pressure 116/71, pulse (!) 122, temperature 98.9 F (37.2 C), temperature source Oral, resp. rate 20, last  menstrual period 06/26/2018, SpO2 98 %. General appearance: alert, cooperative, appears stated age and no distress Lungs: normal effort Heart: regular rate  Abdomen: soft, non-tender; bowel sounds normal Pelvic: gravid uterus GU: No vaginal lesions  Extremities: Homans sign is negative, no sign of DVT DTR's intact Presentation: cephalic Fetal monitoringBaseline: 135 bpm bpm, Variability: Good {> 6 bpm) and Accelerations: Reactive Uterine activity: Frequency: Every 7 minutes Dilation: 4 Effacement (%): 50, 60 Station: -3 Exam by:: [place] Rogers,CNM   Prenatal labs: ABO, Rh: A/Negative/-- (07/06 1405) Antibody: Positive, See Final Results (07/06 1405) Rubella: 5.26 (07/06 1405) RPR: Non Reactive (10/27 0927)  HBsAg: Negative (07/06 1405)  HIV: Non Reactive (10/27 0927)  GBS: --Henderson Cloud (12/29 0241)  2 hr Glucola WNL Genetic screening  NIPS low risk, female, AFP negative Anatomy US WNL  Prenatal Transfer Tool  Maternal Diabetes: No Genetic Screening: Normal Maternal Ultrasounds/Referrals: Normal Fetal Ultrasounds or other Referrals:  Referred to Materal Fetal Medicine  Maternal Substance Abuse:  No Significant Maternal Medications:  None Significant Maternal Lab Results: Group B Strep negative and Rh negative  Results for orders placed or performed during the hospital encounter of 03/23/19 (from the past 24 hour(s))  POCT fern test   Collection Time: 03/23/19 10:48 PM  Result Value Ref Range   POCT Fern Test Positive = ruptured amniotic membanes     Patient Active Problem List   Diagnosis Date Noted  . 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 Rh negative state in antepartum period 09/12/2018  . Maternal morbid obesity, antepartum ([place]) 09/10/2018  . Supervision of normal first pregnancy 08/28/2018    Assessment/Plan:  Connie Fisher is a 24 y.o. G1P0 at 57w4dhere for SOL/SROM.  #Labor: Plan to augment spontaneous labor with pitocin. #Pain: Per patient  request #FWB: Cat 1; EFW: 3900g #ID:  GBS negative #MOF: breast #MOC: POPs #Circ:  N/A; girl  #Rhogam prior to d/c  CGladys Damme MD [facility]elle PGY-1 03/23/2019, 10:56 PM  I saw and evaluated the patient. I agree with the findings and the plan of care as documented in the resident's note. Vertex by RN exam. Expectant management; will recheck in 1-2 hours and plan to start Pitocin if no progress. SROM ~ 2100 with clear fluid. Rhogam eval indicated post-partum.   CBarrington Ellison MD [facility] Hospital[facility] Fellow, Faculty Practice  [facility]

## 2019-03-23 NOTE — MAU Note (Signed)
Pt took two benadryl at 9pm,possible ROM

## 2019-03-24 ENCOUNTER — Inpatient Hospital Stay (HOSPITAL_COMMUNITY): Payer: Medicaid Other | Admitting: Anesthesiology

## 2019-03-24 ENCOUNTER — Encounter (HOSPITAL_COMMUNITY): Payer: Self-pay | Admitting: Obstetrics and Gynecology

## 2019-03-24 DIAGNOSIS — O360130 Maternal care for anti-D [Rh] antibodies, third trimester, not applicable or unspecified: Secondary | ICD-10-CM

## 2019-03-24 DIAGNOSIS — Z3A38 38 weeks gestation of pregnancy: Secondary | ICD-10-CM

## 2019-03-24 LAB — RPR: RPR Ser Ql: NONREACTIVE

## 2019-03-24 LAB — RESPIRATORY PANEL BY RT PCR (FLU A&B, COVID)
Influenza A by PCR: NEGATIVE
Influenza B by PCR: NEGATIVE
SARS Coronavirus 2 by RT PCR: NEGATIVE

## 2019-03-24 MED ORDER — ACETAMINOPHEN 325 MG PO TABS: 650.0000 mg | ORAL_TABLET | ORAL | Status: DC | PRN

## 2019-03-24 MED ORDER — METHYLERGONOVINE MALEATE 0.2 MG/ML IJ SOLN
INTRAMUSCULAR | Status: AC
  Filled 2019-03-24: qty 1

## 2019-03-24 MED ORDER — PRENATAL MULTIVITAMIN CH
1.0000 | ORAL_TABLET | Freq: Every day | ORAL | Status: DC
  Administered 2019-03-25: 1 via ORAL
  Filled 2019-03-24: qty 1

## 2019-03-24 MED ORDER — MISOPROSTOL 200 MCG PO TABS
ORAL_TABLET | ORAL | Status: AC
  Filled 2019-03-24: qty 4

## 2019-03-24 MED ORDER — SENNOSIDES-DOCUSATE SODIUM 8.6-50 MG PO TABS: 2.0000 | ORAL_TABLET | ORAL | Status: DC

## 2019-03-24 MED ORDER — LIDOCAINE HCL (PF) 1 % IJ SOLN
INTRAMUSCULAR | Status: DC | PRN
  Administered 2019-03-24: 10 mL via EPIDURAL

## 2019-03-24 MED ORDER — EPHEDRINE 5 MG/ML INJ: 10.0000 mg | INTRAVENOUS | Status: DC | PRN

## 2019-03-24 MED ORDER — MISOPROSTOL 200 MCG PO TABS
800.0000 ug | ORAL_TABLET | Freq: Once | ORAL | Status: AC
  Administered 2019-03-24: 800 ug via BUCCAL

## 2019-03-24 MED ORDER — LACTATED RINGERS IV SOLN: 500.0000 mL | Freq: Once | INTRAVENOUS | Status: DC

## 2019-03-24 MED ORDER — DIPHENHYDRAMINE HCL 50 MG/ML IJ SOLN: 12.5000 mg | INTRAMUSCULAR | Status: DC | PRN

## 2019-03-24 MED ORDER — SODIUM CHLORIDE (PF) 0.9 % IJ SOLN
INTRAMUSCULAR | Status: DC | PRN
  Administered 2019-03-24: 12 mL/h via EPIDURAL

## 2019-03-24 MED ORDER — ONDANSETRON HCL 4 MG PO TABS
4.0000 mg | ORAL_TABLET | ORAL | Status: DC | PRN
  Administered 2019-03-24: 4 mg via ORAL
  Filled 2019-03-24: qty 1

## 2019-03-24 MED ORDER — IBUPROFEN 600 MG PO TABS
600.0000 mg | ORAL_TABLET | Freq: Four times a day (QID) | ORAL | Status: DC
  Administered 2019-03-24 – 2019-03-25 (×4): 600 mg via ORAL
  Filled 2019-03-24 (×4): qty 1

## 2019-03-24 MED ORDER — SIMETHICONE 80 MG PO CHEW: 80.0000 mg | CHEWABLE_TABLET | ORAL | Status: DC | PRN

## 2019-03-24 MED ORDER — PHENYLEPHRINE 40 MCG/ML (10ML) SYRINGE FOR IV PUSH (FOR BLOOD PRESSURE SUPPORT): 80.0000 ug | PREFILLED_SYRINGE | INTRAVENOUS | Status: DC | PRN

## 2019-03-24 MED ORDER — TETANUS-DIPHTH-ACELL PERTUSSIS 5-2.5-18.5 LF-MCG/0.5 IM SUSP: 0.5000 mL | Freq: Once | INTRAMUSCULAR | Status: DC

## 2019-03-24 MED ORDER — WITCH HAZEL-GLYCERIN EX PADS: 1.0000 "application " | MEDICATED_PAD | CUTANEOUS | Status: DC | PRN

## 2019-03-24 MED ORDER — DIPHENHYDRAMINE HCL 25 MG PO CAPS: 25.0000 mg | ORAL_CAPSULE | Freq: Four times a day (QID) | ORAL | Status: DC | PRN

## 2019-03-24 MED ORDER — COCONUT OIL OIL: 1.0000 "application " | TOPICAL_OIL | Status: DC | PRN

## 2019-03-24 MED ORDER — ONDANSETRON HCL 4 MG/2ML IJ SOLN: 4.0000 mg | INTRAMUSCULAR | Status: DC | PRN

## 2019-03-24 MED ORDER — METHYLERGONOVINE MALEATE 0.2 MG/ML IJ SOLN
0.2000 mg | Freq: Once | INTRAMUSCULAR | Status: AC
  Administered 2019-03-24: 0.2 mg via INTRAMUSCULAR

## 2019-03-24 MED ORDER — FENTANYL-BUPIVACAINE-NACL 0.5-0.125-0.9 MG/250ML-% EP SOLN
12.0000 mL/h | EPIDURAL | Status: DC | PRN
  Filled 2019-03-24: qty 250

## 2019-03-24 MED ORDER — DIBUCAINE (PERIANAL) 1 % EX OINT: 1.0000 "application " | TOPICAL_OINTMENT | CUTANEOUS | Status: DC | PRN

## 2019-03-24 MED ORDER — PHENYLEPHRINE 40 MCG/ML (10ML) SYRINGE FOR IV PUSH (FOR BLOOD PRESSURE SUPPORT)
80.0000 ug | PREFILLED_SYRINGE | INTRAVENOUS | Status: DC | PRN
  Filled 2019-03-24: qty 10

## 2019-03-24 MED ORDER — BENZOCAINE-MENTHOL 20-0.5 % EX AERO: 1.0000 "application " | INHALATION_SPRAY | CUTANEOUS | Status: DC | PRN

## 2019-03-24 MED ORDER — ACETAMINOPHEN 500 MG PO TABS
1000.0000 mg | ORAL_TABLET | Freq: Once | ORAL | Status: AC
  Administered 2019-03-24: 1000 mg via ORAL
  Filled 2019-03-24: qty 2

## 2019-03-24 NOTE — Discharge Summary (Signed)
Postpartum Discharge Summary    Patient Name: Connie Fisher DOB: 09-15-1995 MRN: [id number]  Date of admission: 03/23/2019 Delivering Provider: Wende Mott   Date of discharge: 03/25/2019  Admitting diagnosis: Labor and delivery, indication for care [O75.9] Intrauterine pregnancy: [redacted]w[redacted]d    Secondary diagnosis:  Active Problems:   Supervision of normal first pregnancy   Maternal morbid obesity, antepartum ([place]   Rh negative state in antepartum period   Genetic carrier status   Labor and delivery, indication for care  Additional problems:      Discharge diagnosis: Term Pregnancy Delivered                                                                                                Post partum procedures:rhogam  Augmentation: Pitocin  Complications: None  Hospital course:  Onset of Labor With Vaginal Delivery     24y.o. yo G1P0 at 340w5das admitted in Latent Labor on 03/23/2019. Patient had an uncomplicated labor course as follows:  Membrane Rupture Time/Date: 9:00 PM ,03/23/2019   Intrapartum Procedures: Episiotomy: None [1]                                         Lacerations:  2nd degree [3]  Patient had a delivery of a Viable infant. 03/24/2019  Information for the patient's newborn:  KeLauretta, Sallas0[[id number]]Delivery Method: Vaginal, Spontaneous(Filed from Delivery Summary)     Pateint had an uncomplicated postpartum course.  She is ambulating, tolerating a regular diet, passing flatus, and urinating well. Patient is discharged home in stable condition on 03/25/19.  Delivery time: 1:17 PM    Magnesium Sulfate received: No BMZ received: No Rhophylac:Yes MMR:N/A Transfusion:No  Physical exam  Vitals:   03/24/19 1705 03/24/19 2059 03/25/19 0045 03/25/19 0451  BP: 131/79 113/65 111/66 115/71  Pulse: (!) 108 92 100 93  Resp: '18 18 20 18  ' Temp: 99.3 F (37.4 C) 98.2 F (36.8 C) 98.3 F (36.8 C) 98.1 F (36.7 C)  TempSrc: Oral Oral Oral Oral   SpO2: 100%      General: alert, cooperative and no distress Lochia: appropriate Uterine Fundus: firm Incision: N/A DVT Evaluation: No evidence of DVT seen on physical exam. No significant calf/ankle edema. Labs: Lab Results  Component Value Date   WBC 13.5 (H) 03/25/2019   HGB 10.3 (L) 03/25/2019   HCT 31.1 (L) 03/25/2019   MCV 92.3 03/25/2019   PLT 165 03/25/2019   CMP Latest Ref Rng & Units 03/19/2019  Glucose 70 - 99 mg/dL 96  BUN 6 - 20 mg/dL <5(L)  Creatinine 0.44 - 1.00 mg/dL 0.66  Sodium 135 - 145 mmol/L 137  Potassium 3.5 - 5.1 mmol/L 3.9  Chloride 98 - 111 mmol/L 109  CO2 22 - 32 mmol/L 20(L)  Calcium 8.9 - 10.3 mg/dL 9.0  Total Protein 6.5 - 8.1 g/dL 5.9(L)  Total Bilirubin 0.3 - 1.2 mg/dL 0.4  Alkaline Phos 38 - 126 U/L 134(H)  AST 15 - 41  U/L 20  ALT 0 - 44 U/L 16    Discharge instruction: per After Visit Summary and "Baby and Me Booklet".  After visit meds:  Allergies as of 03/25/2019   No Known Allergies     Medication List    STOP taking these medications   aspirin EC 81 MG tablet     TAKE these medications   acetaminophen 325 MG tablet Commonly known as: Tylenol Take 2 tablets (650 mg total) by mouth every 4 (four) hours as needed (for pain scale < 4).   Blood Pressure Monitor Kit 1 kit by Does not apply route once a week. Check BP weekly.  Large Cuff. DX:  Z13.6         Z34.86   [place] as directed.   cyclobenzaprine 5 MG tablet Commonly known as: FLEXERIL Take 1 tablet (5 mg total) by mouth 3 (three) times daily as needed for muscle spasms.   ferrous sulfate 325 (65 FE) MG tablet Take 1 tablet (325 mg total) by mouth every other day. Start taking on: March 27, 2019   ibuprofen 600 MG tablet Commonly known as: ADVIL Take 1 tablet (600 mg total) by mouth every 6 (six) hours.   norethindrone 0.35 MG tablet Commonly known as: Ortho Micronor Take 1 tablet (0.35 mg total) by mouth daily.    omeprazole 20 MG capsule Commonly known as: PriLOSEC Take 1 capsule (20 mg total) by mouth daily.   PNV PO Take by mouth.   polyethylene glycol powder 17 GM/SCOOP powder Commonly known as: GLYCOLAX/MIRALAX Take 255 g by mouth once for 1 dose.       Diet: routine diet  Activity: Advance as tolerated. Pelvic rest for 6 weeks.   Outpatient follow up:4 weeks Follow up Appt: Future Appointments  Date Time Provider [place]  04/24/2019 10:15 AM Leftwich-Kirby, Kathie Dike, CNM [facility] None   Follow up Visit: Please schedule this patient for Postpartum visit in: 4 weeks with the following provider: Any provider Virtual For C/S patients schedule nurse incision check in weeks 2 weeks: no Low risk pregnancy complicated by: n/a Delivery mode:  SVD Anticipated Birth Control:  POPs PP Procedures needed: n/a  Schedule Integrated BH visit: no   Newborn Data: Live born female  Birth Weight:  3399 grams APGAR: 71, 9  Newborn Delivery   Birth date/time: 03/24/2019 13:17:00 Delivery type: Vaginal, Spontaneous      Baby Feeding: Bottle and Breast Disposition:home with mother

## 2019-03-24 NOTE — Progress Notes (Signed)
Labor Progress Note Quinlee Sciarra is a 24 y.o. G1P0 at [redacted]w[redacted]d presented for SOL S: Patient comfortable, not feeling ctx  O:  BP (!) 145/83   Pulse (!) 113   Temp 99.2 F (37.3 C) (Oral)   Resp 19   LMP 06/26/2018   SpO2 98%  EFM: 125/+accels/-decels  CVE: Dilation: 5 Effacement (%): 80 Station: -3 Presentation: Vertex Exam by:: Jonelle Sidle, RN   A&P: 24 y.o. G1P0 [redacted]w[redacted]d here for SOL #Labor: Progressing well. CVE improved. Plan to start pitocin now. #Pain: per patient request #FWB: Cat I #GBS negative  #Rh eval prior to d/c  Shirlean Mylar, MD 1:09 AM

## 2019-03-24 NOTE — Progress Notes (Signed)
Labor Progress Note Connie Fisher is a 24 y.o. G1P0 at [redacted]w[redacted]d presented for SOL, SROM S: Patient comfortable with epidural, feeling pressure in butt.  O:  BP 129/61   Pulse (!) 112   Temp 98.3 F (36.8 C) (Oral)   Resp 19   LMP 06/26/2018   SpO2 98%  EFM: 135bpm/+accels/-decels  CVE: Dilation: 8 Effacement (%): 100 Cervical Position: Middle Station: 0 Presentation: Vertex Exam by:: Dr. Leary Roca   A&P: 24 y.o. G1P0 [redacted]w[redacted]d here for SOL. #Labor: Progressing very well. Pitocin currently @6mu /min, ctx q39m. #Pain: Epidural #FWB: Cat 1 #GBS negative #Rhogam eval prior to d/c  1m, MD 6:51 AM

## 2019-03-24 NOTE — Anesthesia Postprocedure Evaluation (Signed)
Anesthesia Post Note  Patient: Connie Fisher  Procedure(s) Performed: AN AD HOC LABOR EPIDURAL     Patient location during evaluation: Mother Baby Anesthesia Type: Epidural Level of consciousness: awake and alert and oriented Pain management: satisfactory to patient Vital Signs Assessment: post-procedure vital signs reviewed and stable Respiratory status: spontaneous breathing and nonlabored ventilation Cardiovascular status: stable Postop Assessment: no headache, no backache, no signs of nausea or vomiting, adequate PO intake, patient able to bend at knees and able to ambulate (patient up walking) Anesthetic complications: no    Last Vitals:  Vitals:   03/24/19 1547 03/24/19 1705  BP:  131/79  Pulse:  (!) 108  Resp:  18  Temp: (!) 38.2 C 37.4 C  SpO2:  100%    Last Pain:  Vitals:   03/24/19 1705  TempSrc: Oral  PainSc: 3    Pain Goal:                Epidural/Spinal Function Cutaneous sensation: Normal sensation (03/24/19 1705), Patient able to flex knees: Yes (03/24/19 1705), Patient able to lift hips off bed: Yes (03/24/19 1705), Back pain beyond tenderness at insertion site: No (03/24/19 1705), Progressively worsening motor and/or sensory loss: No (03/24/19 1705), Bowel and/or bladder incontinence post epidural: No (03/24/19 1705)  Jayme Mednick

## 2019-03-24 NOTE — Anesthesia Preprocedure Evaluation (Signed)
Anesthesia Evaluation  Patient identified by MRN, date of birth, ID band Patient awake    Reviewed: Allergy & Precautions, H&P , NPO status , Patient's Chart, lab work & pertinent test results  History of Anesthesia Complications Negative for: history of anesthetic complications  Airway Mallampati: II  TM Distance: >3 FB Neck ROM: full    Dental no notable dental hx.    Pulmonary neg pulmonary ROS,    Pulmonary exam normal        Cardiovascular negative cardio ROS Normal cardiovascular exam Rhythm:regular Rate:Normal     Neuro/Psych negative neurological ROS  negative psych ROS   GI/Hepatic negative GI ROS, Neg liver ROS,   Endo/Other  Morbid obesity  Renal/GU negative Renal ROS  negative genitourinary   Musculoskeletal   Abdominal   Peds  Hematology negative hematology ROS (+)   Anesthesia Other Findings   Reproductive/Obstetrics (+) Pregnancy                             Anesthesia Physical Anesthesia Plan  ASA: III  Anesthesia Plan: Epidural   Post-op Pain Management:    Induction:   PONV Risk Score and Plan:   Airway Management Planned:   Additional Equipment:   Intra-op Plan:   Post-operative Plan:   Informed Consent: I have reviewed the patients History and Physical, chart, labs and discussed the procedure including the risks, benefits and alternatives for the proposed anesthesia with the patient or authorized representative who has indicated his/her understanding and acceptance.       Plan Discussed with:   Anesthesia Plan Comments:         Anesthesia Quick Evaluation  

## 2019-03-24 NOTE — Anesthesia Procedure Notes (Signed)
Epidural Patient location during procedure: OB Start time: 03/24/2019 2:30 AM End time: 03/24/2019 2:42 AM  Staffing Anesthesiologist: Lucretia Kern, MD Performed: anesthesiologist   Preanesthetic Checklist Completed: patient identified, IV checked, risks and benefits discussed, monitors and equipment checked, pre-op evaluation and timeout performed  Epidural Patient position: sitting Prep: DuraPrep Patient monitoring: heart rate, continuous pulse ox and blood pressure Approach: midline Location: L3-L4 Injection technique: LOR air  Needle:  Needle type: Tuohy  Needle gauge: 17 G Needle length: 9 cm Needle insertion depth: 8 cm Catheter type: closed end flexible Catheter size: 19 Gauge Catheter at skin depth: 14 cm Test dose: negative  Assessment Events: blood not aspirated, injection not painful, no injection resistance, no paresthesia and negative IV test  Additional Notes Reason for block:procedure for pain

## 2019-03-24 NOTE — Progress Notes (Signed)
Labor Progress Note Connie Fisher is a 24 y.o. G1P0 at [redacted]w[redacted]d presented for SOL S: Epidural in, pt comfortable.  O:  BP 106/73   Pulse (!) 136   Temp 98.3 F (36.8 C) (Oral)   Resp 20   LMP 06/26/2018   SpO2 98%  EFM: 130/+accels/-decels  CVE: Dilation: 5.5 Effacement (%): 90 Cervical Position: Middle Station: -2 Presentation: Vertex Exam by:: Jonelle Sidle, RN   A&P: 24 y.o. G1P0 [redacted]w[redacted]d here for SOL #Labor: Progressing well. Pitocin currently @6  mu/min, ctx q59m. Plan to continue q36min increase in pitocin. #Pain: Epidural #FWB: Cat I #GBS negative  #Rh eval prior to d/c  31m, MD 5:51 AM

## 2019-03-24 NOTE — Lactation Note (Signed)
This note was copied from a baby's chart. Lactation Consultation Note  Patient Name: Connie Fisher Today's Date: 03/24/2019 Reason for consult: Initial assessment;Primapara;1st time breastfeeding;Early term 37-38.6wks  Baby "[name]" is a 9 hour, ETI who weighed 7 lbs 7.9 ounces at birth. [name] was swaddled on Ms. Shearer chest upon LC entrance, and mom politely agreed to allow [facility] to complete an initial consult.   Mom is a P1 and reported (+) breast changes throughout her pregnancy. She did take breastfeeding education classes through both [place] and [facility]). Mom stated that a family member purchased her a DEBP, but could not recall the brand.   Ms. Buege reported that she has been putting [name] to both breasts and following up with formula. Mom expressed that she was concerned that [name] was not getting enough from the breast. LC discussed infant stomach size and proper formula supplementation amounts for day 1. LC checked [name]'s diaper, re-swaddled her, and placed her in the bassinet at mom's request. Mom was agreeable to allowing LC to teach hand expression on her left breast. Mom has soft, compressible breast tissue and her nipples were everted and intact. LC noted drops of colostrum and fed them to [name] with a gloved finger.   LC encouraged mom to feed [name] 8-12x in a 24 hour period. LC discussed normal, newborn behavior, output, feeding cues, and OP services. LC reviewed lactation brochure and left it at bedside. Mom reported no further questions or concerns and will call as needed.   Maternal Data Formula Feeding for Exclusion: Yes Reason for exclusion: Mother's choice to formula and breast feed on admission Has patient been taught Hand Expression?: Yes Does the patient have breastfeeding experience prior to this delivery?: No  Feeding Feeding Type: Bottle Fed - Formula Nipple Type: Slow - flow  Interventions Interventions: Breast feeding basics reviewed;Hand express;Breast  massage  Lactation Tools Discussed/Used WIC Program: Yes   Consult Status Consult Status: Follow-up Date: 03/25/19 Follow-up type: In-patient    Walker Shadow 03/24/2019, 10:40 PM

## 2019-03-24 NOTE — Progress Notes (Signed)
Connie Fisher is a 24 y.o. G1P0 at [redacted]w[redacted]d by ultrasound admitted for active labor  Subjective: Well.  Comfortable with epidural, resting.  No complaints  Objective: BP 112/79   Pulse (!) 102   Temp 98.7 F (37.1 C) (Oral)   Resp 18   LMP 06/26/2018   SpO2 100%  I/O last 3 completed shifts: In: -  Out: 125 [Urine:125] Total I/O In: -  Out: 50 [Urine:50]  FHT:  FHR: 135 bpm, variability: moderate,  accelerations:  Present,  decelerations:  Absent UC:   regular, every 2-3 minutes SVE:   Dilation: 8 Effacement (%): 100 Station: 0 Exam by:: Dr. Leary Roca  Labs: Lab Results  Component Value Date   WBC 11.8 (H) 03/23/2019   HGB 12.4 03/23/2019   HCT 36.8 03/23/2019   MCV 90.9 03/23/2019   PLT 176 03/23/2019    Assessment / Plan: Augmentation of labor, progressing well   -Titrate pitocin PRN to maintain contraction pattern of every 2-3 minutes that are moderate in intensity. -Continuous fetal monitoring  Labor: Progressing normally Fetal Wellbeing:  Category I Pain Control:  Epidural I/D:  n/a Anticipated MOD:  NSVD  Felipa Eth, SNM 03/24/2019, 8:40 AM

## 2019-03-25 LAB — CBC
HCT: 31.1 % — ABNORMAL LOW (ref 36.0–46.0)
Hemoglobin: 10.3 g/dL — ABNORMAL LOW (ref 12.0–15.0)
MCH: 30.6 pg (ref 26.0–34.0)
MCHC: 33.1 g/dL (ref 30.0–36.0)
MCV: 92.3 fL (ref 80.0–100.0)
Platelets: 165 10*3/uL (ref 150–400)
RBC: 3.37 MIL/uL — ABNORMAL LOW (ref 3.87–5.11)
RDW: 14.7 % (ref 11.5–15.5)
WBC: 13.5 10*3/uL — ABNORMAL HIGH (ref 4.0–10.5)
nRBC: 0 % (ref 0.0–0.2)

## 2019-03-25 MED ORDER — POLYETHYLENE GLYCOL 3350 17 GM/SCOOP PO POWD: 1.0000 | Freq: Once | ORAL | 0 refills | Status: AC

## 2019-03-25 MED ORDER — FERROUS SULFATE 325 (65 FE) MG PO TABS
325.0000 mg | ORAL_TABLET | ORAL | Status: DC
  Administered 2019-03-25: 325 mg via ORAL
  Filled 2019-03-25: qty 1

## 2019-03-25 MED ORDER — IBUPROFEN 600 MG PO TABS: 600.0000 mg | ORAL_TABLET | Freq: Four times a day (QID) | ORAL | 0 refills | Status: DC

## 2019-03-25 MED ORDER — FERROUS SULFATE 325 (65 FE) MG PO TABS: 325.0000 mg | ORAL_TABLET | ORAL | 3 refills | Status: DC

## 2019-03-25 MED ORDER — RHO D IMMUNE GLOBULIN 1500 UNIT/2ML IJ SOSY
300.0000 ug | PREFILLED_SYRINGE | Freq: Once | INTRAMUSCULAR | Status: AC
  Administered 2019-03-25: 300 ug via INTRAVENOUS
  Filled 2019-03-25: qty 2

## 2019-03-25 MED ORDER — ACETAMINOPHEN 325 MG PO TABS: 650.0000 mg | ORAL_TABLET | ORAL | 0 refills | Status: DC | PRN

## 2019-03-25 MED ORDER — NORETHINDRONE 0.35 MG PO TABS: 1.0000 | ORAL_TABLET | Freq: Every day | ORAL | 11 refills | Status: DC

## 2019-03-25 NOTE — Lactation Note (Signed)
This note was copied from a baby's chart. Lactation Consultation Note  Patient Name: Connie Fisher GLOVF'I Date: 03/25/2019 Reason for consult: Follow-up assessment   Baby 23 hours old and mother is breastfeeding and giving formula. Mother recently gave 25 ml of formula and did not latch baby. Discussed supply and demand and the importance of bf first before offering formula to help establish her milk supply. Provided volume guidelines for both breastfeeding and only formula feeding. Mother denies questions or concerns and states bf is going well. Encouraged her to breastfeed on demand.    Maternal Data    Feeding Feeding Type: Bottle Fed - Formula Nipple Type: Slow - flow  LATCH Score                   Interventions Interventions: Breast feeding basics reviewed  Lactation Tools Discussed/Used     Consult Status Consult Status: Follow-up Date: 03/26/19 Follow-up type: In-patient    Dahlia Byes [facility] 03/25/2019, 12:17 PM

## 2019-03-25 NOTE — Progress Notes (Addendum)
Post Partum Day 1 Subjective: no complaints, up ad lib, voiding, tolerating PO and + flatus  Some perineal pain associated with 2nd degree laceration.    Objective: Blood pressure 115/71, pulse 93, temperature 98.1 F (36.7 C), temperature source Oral, resp. rate 18, last menstrual period 06/26/2018, SpO2 100 %, unknown if currently breastfeeding.  Physical Exam:  General: alert, cooperative and no distress Lochia: appropriate Uterine Fundus: firm Incision: N/a  DVT Evaluation: No evidence of DVT seen on physical exam. No significant calf/ankle edema.  Recent Labs    03/23/19 2306 03/25/19 0346  HGB 12.4 10.3*  HCT 36.8 31.1*    Assessment/Plan: 24 y/o G1P1001 ppd 1 s/p vaginal delivery.   Breastfeeding and Contraception Progestin-only Pills  Awaiting Rhogam evaluation.  Plan for discharge tomorrow.    LOS: 2 days   Connie Fisher 03/25/2019, 7:35 AM

## 2019-03-25 NOTE — Discharge Instructions (Signed)

## 2019-03-26 ENCOUNTER — Encounter: Payer: Medicaid Other | Admitting: Advanced Practice Midwife

## 2019-03-26 LAB — RH IG WORKUP (INCLUDES ABO/RH)
ABO/RH(D): A NEG
Fetal Screen: NEGATIVE
Gestational Age(Wks): 38.5
Unit division: 0

## 2019-03-26 LAB — TYPE AND SCREEN
ABO/RH(D): A NEG
Antibody Screen: POSITIVE
Unit division: 0
Unit division: 0

## 2019-03-26 LAB — BPAM RBC
Blood Product Expiration Date: 202102172359
Blood Product Expiration Date: 202102222359
Unit Type and Rh: 600
Unit Type and Rh: 600

## 2019-04-02 ENCOUNTER — Encounter: Payer: Medicaid Other | Admitting: Family Medicine

## 2019-04-24 ENCOUNTER — Ambulatory Visit: Payer: Medicaid Other | Admitting: Advanced Practice Midwife

## 2019-09-05 DIAGNOSIS — Z419 Encounter for procedure for purposes other than remedying health state, unspecified: Secondary | ICD-10-CM | POA: Diagnosis not present

## 2019-10-06 DIAGNOSIS — Z419 Encounter for procedure for purposes other than remedying health state, unspecified: Secondary | ICD-10-CM | POA: Diagnosis not present

## 2019-11-06 DIAGNOSIS — Z419 Encounter for procedure for purposes other than remedying health state, unspecified: Secondary | ICD-10-CM | POA: Diagnosis not present

## 2019-12-06 DIAGNOSIS — Z419 Encounter for procedure for purposes other than remedying health state, unspecified: Secondary | ICD-10-CM | POA: Diagnosis not present

## 2020-01-06 DIAGNOSIS — Z419 Encounter for procedure for purposes other than remedying health state, unspecified: Secondary | ICD-10-CM | POA: Diagnosis not present

## 2020-02-05 DIAGNOSIS — Z419 Encounter for procedure for purposes other than remedying health state, unspecified: Secondary | ICD-10-CM | POA: Diagnosis not present

## 2020-03-07 DIAGNOSIS — Z419 Encounter for procedure for purposes other than remedying health state, unspecified: Secondary | ICD-10-CM | POA: Diagnosis not present

## 2020-04-07 DIAGNOSIS — Z419 Encounter for procedure for purposes other than remedying health state, unspecified: Secondary | ICD-10-CM | POA: Diagnosis not present

## 2020-05-05 DIAGNOSIS — Z419 Encounter for procedure for purposes other than remedying health state, unspecified: Secondary | ICD-10-CM | POA: Diagnosis not present

## 2020-06-05 DIAGNOSIS — Z419 Encounter for procedure for purposes other than remedying health state, unspecified: Secondary | ICD-10-CM | POA: Diagnosis not present

## 2020-06-26 ENCOUNTER — Other Ambulatory Visit: Payer: Self-pay

## 2020-06-26 ENCOUNTER — Encounter (HOSPITAL_COMMUNITY): Payer: Self-pay

## 2020-06-26 ENCOUNTER — Emergency Department (HOSPITAL_COMMUNITY)
Admission: EM | Admit: 2020-06-26 | Discharge: 2020-06-27 | Discharge status: Against Medical Advice | Payer: Medicaid Other | Attending: Emergency Medicine | Admitting: Emergency Medicine

## 2020-06-26 DIAGNOSIS — R1013 Epigastric pain: Secondary | ICD-10-CM | POA: Insufficient documentation

## 2020-06-26 DIAGNOSIS — R109 Unspecified abdominal pain: Secondary | ICD-10-CM | POA: Diagnosis present

## 2020-06-26 DIAGNOSIS — Z5321 Procedure and treatment not carried out due to patient leaving prior to being seen by health care provider: Secondary | ICD-10-CM | POA: Diagnosis not present

## 2020-06-26 DIAGNOSIS — R112 Nausea with vomiting, unspecified: Secondary | ICD-10-CM | POA: Insufficient documentation

## 2020-06-26 DIAGNOSIS — K5900 Constipation, unspecified: Secondary | ICD-10-CM | POA: Insufficient documentation

## 2020-06-26 DIAGNOSIS — R079 Chest pain, unspecified: Secondary | ICD-10-CM | POA: Diagnosis not present

## 2020-06-26 LAB — URINALYSIS, ROUTINE W REFLEX MICROSCOPIC
Bilirubin Urine: NEGATIVE
Glucose, UA: NEGATIVE mg/dL
Hgb urine dipstick: NEGATIVE
Ketones, ur: NEGATIVE mg/dL
Leukocytes,Ua: NEGATIVE
Nitrite: NEGATIVE
Protein, ur: NEGATIVE mg/dL
Specific Gravity, Urine: 1.026 (ref 1.005–1.030)
pH: 5 (ref 5.0–8.0)

## 2020-06-26 LAB — COMPREHENSIVE METABOLIC PANEL
ALT: 251 U/L — ABNORMAL HIGH (ref 0–44)
AST: 241 U/L — ABNORMAL HIGH (ref 15–41)
Albumin: 4 g/dL (ref 3.5–5.0)
Alkaline Phosphatase: 86 U/L (ref 38–126)
Anion gap: 7 (ref 5–15)
BUN: 7 mg/dL (ref 6–20)
CO2: 25 mmol/L (ref 22–32)
Calcium: 8.9 mg/dL (ref 8.9–10.3)
Chloride: 105 mmol/L (ref 98–111)
Creatinine, Ser: 0.63 mg/dL (ref 0.44–1.00)
GFR, Estimated: >60 mL/min (ref >60)
Glucose, Bld: 103 mg/dL — ABNORMAL HIGH (ref 70–99)
Potassium: 3.6 mmol/L (ref 3.5–5.1)
Sodium: 137 mmol/L (ref 135–145)
Total Bilirubin: 0.9 mg/dL (ref 0.3–1.2)
Total Protein: 7.1 g/dL (ref 6.5–8.1)

## 2020-06-26 LAB — CBC WITH DIFFERENTIAL/PLATELET
Abs Immature Granulocytes: 0.03 10*3/uL (ref 0.00–0.07)
Basophils Absolute: 0 10*3/uL (ref 0.0–0.1)
Basophils Relative: 0 %
Eosinophils Absolute: 0.2 10*3/uL (ref 0.0–0.5)
Eosinophils Relative: 2 %
HCT: 41.6 % (ref 36.0–46.0)
Hemoglobin: 13.6 g/dL (ref 12.0–15.0)
Immature Granulocytes: 0 %
Lymphocytes Relative: 15 %
Lymphs Abs: 1.9 10*3/uL (ref 0.7–4.0)
MCH: 31.1 pg (ref 26.0–34.0)
MCHC: 32.7 g/dL (ref 30.0–36.0)
MCV: 95.2 fL (ref 80.0–100.0)
Monocytes Absolute: 0.8 10*3/uL (ref 0.1–1.0)
Monocytes Relative: 6 %
Neutro Abs: 9.5 10*3/uL — ABNORMAL HIGH (ref 1.7–7.7)
Neutrophils Relative %: 77 %
Platelets: 262 10*3/uL (ref 150–400)
RBC: 4.37 MIL/uL (ref 3.87–5.11)
RDW: 13.1 % (ref 11.5–15.5)
WBC: 12.3 10*3/uL — ABNORMAL HIGH (ref 4.0–10.5)
nRBC: 0 % (ref 0.0–0.2)

## 2020-06-26 LAB — I-STAT BETA HCG BLOOD, ED (MC, WL, AP ONLY): I-stat hCG, quantitative: <5 m[IU]/mL (ref <5)

## 2020-06-26 LAB — TROPONIN I (HIGH SENSITIVITY): Troponin I (High Sensitivity): 3 ng/L (ref <18)

## 2020-06-26 LAB — LIPASE, BLOOD: Lipase: 34 U/L (ref 11–51)

## 2020-06-26 NOTE — ED Triage Notes (Signed)
Emergency Medicine Provider Triage Evaluation Note  Connie Fisher , a 25 y.o. female  was evaluated in triage.  Pt complains of chest pain, epigastric pain, nausea, vomiting, and constipation.  Chest pain has been midsternal with no radiation.  Chest pain is intermittent.    Patient endorses epigastric pain.  Pain is worse after eating meals.  Patient endorses associated nausea and vomiting.  Patient has vomited 4 times last 24 hours.  Emesis is stomach contents, no bleeding or coffee-ground emesis.  Patient also endorses constipation and states her last bowel movement was yesterday.  Review of Systems  Positive: Abdominal pain, chest pain, nausea, vomiting, constipation, chills Negative: Fever, shortness of breath, urinary symptoms, rectal bleeding, melena  Physical Exam  BP 111/68 (BP Location: Left Arm)   Pulse 90   Temp 98.5 F (36.9 C) (Oral)   Resp 18   Ht 5\' 3"  (1.6 m)   Wt (!) 140.2 kg   LMP 05/31/2020   SpO2 100%   BMI 54.75 kg/m  Gen:   Awake, no distress   HEENT:  Atraumatic  Resp:  Normal effort Cardiac:  Normal rate  Abd:   Nondistended, tenderness to epigastric area MSK:   Moves extremities without difficulty  Neuro:  Speech clear   Medical Decision Making  Medically screening exam initiated at 8:56 PM.  Appropriate orders placed.  Hanny Elsberry was informed that the remainder of the evaluation will be completed by another provider, this initial triage assessment does not replace that evaluation, and the importance of remaining in the ED until their evaluation is complete.  Clinical Impression   The patient appears stable so that the remainder of the work up may be completed by another provider.      [name], Haskel Schroeder 06/26/20 2058

## 2020-06-26 NOTE — ED Triage Notes (Signed)
Patient reports chest pain x 3 days, states abdominal pain with nausea and vomiting and constipation, unsure if she is pregnant

## 2020-06-26 NOTE — ED Notes (Signed)
Called Pt for vitals no answer. 

## 2020-06-28 ENCOUNTER — Encounter (HOSPITAL_COMMUNITY): Payer: Self-pay

## 2020-06-28 ENCOUNTER — Other Ambulatory Visit: Payer: Self-pay

## 2020-06-28 ENCOUNTER — Ambulatory Visit (HOSPITAL_COMMUNITY)
Admission: EM | Admit: 2020-06-28 | Discharge: 2020-06-28 | Disposition: A | Discharge status: Home / Self Care | Payer: Medicaid Other | Attending: Family Medicine | Admitting: Family Medicine

## 2020-06-28 DIAGNOSIS — K2900 Acute gastritis without bleeding: Secondary | ICD-10-CM | POA: Insufficient documentation

## 2020-06-28 DIAGNOSIS — R112 Nausea with vomiting, unspecified: Secondary | ICD-10-CM | POA: Insufficient documentation

## 2020-06-28 LAB — CBC WITH DIFFERENTIAL/PLATELET
Abs Immature Granulocytes: 0.03 10*3/uL (ref 0.00–0.07)
Basophils Absolute: 0 10*3/uL (ref 0.0–0.1)
Basophils Relative: 1 %
Eosinophils Absolute: 0.3 10*3/uL (ref 0.0–0.5)
Eosinophils Relative: 3 %
HCT: 40.3 % (ref 36.0–46.0)
Hemoglobin: 13.4 g/dL (ref 12.0–15.0)
Immature Granulocytes: 0 %
Lymphocytes Relative: 20 %
Lymphs Abs: 1.8 10*3/uL (ref 0.7–4.0)
MCH: 31.3 pg (ref 26.0–34.0)
MCHC: 33.3 g/dL (ref 30.0–36.0)
MCV: 94.2 fL (ref 80.0–100.0)
Monocytes Absolute: 0.7 10*3/uL (ref 0.1–1.0)
Monocytes Relative: 8 %
Neutro Abs: 6 10*3/uL (ref 1.7–7.7)
Neutrophils Relative %: 68 %
Platelets: 256 10*3/uL (ref 150–400)
RBC: 4.28 MIL/uL (ref 3.87–5.11)
RDW: 13 % (ref 11.5–15.5)
WBC: 8.7 10*3/uL (ref 4.0–10.5)
nRBC: 0 % (ref 0.0–0.2)

## 2020-06-28 LAB — COMPREHENSIVE METABOLIC PANEL
ALT: 845 U/L — ABNORMAL HIGH (ref 0–44)
AST: 509 U/L — ABNORMAL HIGH (ref 15–41)
Albumin: 3.9 g/dL (ref 3.5–5.0)
Alkaline Phosphatase: 143 U/L — ABNORMAL HIGH (ref 38–126)
Anion gap: 8 (ref 5–15)
BUN: 5 mg/dL — ABNORMAL LOW (ref 6–20)
CO2: 24 mmol/L (ref 22–32)
Calcium: 9 mg/dL (ref 8.9–10.3)
Chloride: 105 mmol/L (ref 98–111)
Creatinine, Ser: 0.67 mg/dL (ref 0.44–1.00)
GFR, Estimated: >60 mL/min (ref >60)
Glucose, Bld: 99 mg/dL (ref 70–99)
Potassium: 4 mmol/L (ref 3.5–5.1)
Sodium: 137 mmol/L (ref 135–145)
Total Bilirubin: 3.4 mg/dL — ABNORMAL HIGH (ref 0.3–1.2)
Total Protein: 7 g/dL (ref 6.5–8.1)

## 2020-06-28 LAB — LIPASE, BLOOD: Lipase: 31 U/L (ref 11–51)

## 2020-06-28 MED ORDER — SUCRALFATE 1 G PO TABS: 1.0000 g | ORAL_TABLET | Freq: Three times a day (TID) | ORAL | 1 refills | Status: DC | PRN

## 2020-06-28 MED ORDER — PANTOPRAZOLE SODIUM 40 MG PO TBEC: 40.0000 mg | DELAYED_RELEASE_TABLET | Freq: Every day | ORAL | 0 refills | Status: DC

## 2020-06-28 NOTE — ED Triage Notes (Signed)
Pt in with c/o epigastric pain that has been going on for over 1 month  Pt also c/o chills, burping and vomiting that started 5 days ago  Pt states her poop is yellow now

## 2020-06-28 NOTE — ED Provider Notes (Signed)
[facility]    CSN: [id number] Arrival date & time: 06/28/20  1638      History   Chief Complaint Chief Complaint  Patient presents with  . Abdominal Pain  . Chills  . Emesis    HPI Connie Fisher is a 25 y.o. female.   Patient presenting today with 5-day history of epigastric abdominal pain that she states comes and goes and is associated with nausea, vomiting.  She states it seems worse after eating large meals, laying down to sleep.  States she initially had this happen during her pregnancy and directly after, has not had that since.  She denies fever, chills, diarrhea, medication changes, new foods or recent travel.  No known sick contacts.  She states she has tried MiraLAX, Gas-X, Tums with minimal relief.     Past Medical History:  Diagnosis Date  . Heart murmur    AS A BABY  . Morbid obesity [facility]     Patient Active Problem List   Diagnosis Date Noted  . Labor and delivery, indication for care 03/23/2019  . 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 Rh negative state in antepartum period 09/12/2018  . Maternal morbid obesity, antepartum ([place]) 09/10/2018  . Supervision of normal first pregnancy 08/28/2018    Past Surgical History:  Procedure Laterality Date  . NO PAST SURGERIES      OB History    Gravida  1   Para  1   Term  1   Preterm      AB      Living  1     SAB      IAB      Ectopic      Multiple  0   Live Births  1            Home Medications    Prior to Admission medications   Medication Sig Start Date End Date Taking? Authorizing Provider  pantoprazole (PROTONIX) 40 MG tablet Take 1 tablet (40 mg total) by mouth daily. 06/28/20  Yes Volney American, PA-C  sucralfate (CARAFATE) 1 g tablet Take 1 tablet (1 g total) by mouth 3 (three) times daily as needed. 06/28/20  Yes Volney American, PA-C  acetaminophen (TYLENOL) 325 MG tablet Take 2 tablets (650 mg total) by mouth every 4 (four) hours as needed (for  pain scale < 4). 03/25/19   Clarnce Flock, MD  Blood Pressure Monitor KIT 1 kit by Does not apply route once a week. Check BP weekly.  Large Cuff. DX:  Z13.6         Z34.86 09/10/18   Anyanwu, Sallyanne Havers, MD  cyclobenzaprine (FLEXERIL) 5 MG tablet Take 1 tablet (5 mg total) by mouth 3 (three) times daily as needed for muscle spasms. 01/17/19   Jorje Guild, NP  Elastic Bandages & Supports (COMFORT FIT MATERNITY SUPP SM) MISC Wear as directed. 02/05/19   Shelly Bombard, MD  ferrous sulfate 325 (65 FE) MG tablet Take 1 tablet (325 mg total) by mouth every other day. 03/27/19   Clarnce Flock, MD  ibuprofen (ADVIL) 600 MG tablet Take 1 tablet (600 mg total) by mouth every 6 (six) hours. 03/25/19   Clarnce Flock, MD  norethindrone (ORTHO MICRONOR) 0.35 MG tablet Take 1 tablet (0.35 mg total) by mouth daily. 03/25/19 03/24/20  Clarnce Flock, MD  omeprazole (PRILOSEC) 20 MG capsule Take 1 capsule (20 mg total) by mouth daily. 01/17/19   Jorje Guild, NP  Prenatal Vit w/Fe-Methylfol-FA (PNV PO) Take by mouth.    [provider]    Family History Family History  Problem Relation Age of Onset  . Deep vein thrombosis Mother   . Kidney Stones Mother   . Diabetes Father   . Asthma Brother   . Arthritis Paternal Uncle   . Cancer Paternal Uncle   . Diabetes Paternal Grandmother     Social History Social History   Tobacco Use  . Smoking status: Never Smoker  . Smokeless tobacco: Never Used  Vaping Use  . Vaping Use: Never used  Substance Use Topics  . Alcohol use: Not Currently    Comment: occ, events  . Drug use: Never     Allergies   Patient has no known allergies.   Review of Systems Review of Systems Per HPI Physical Exam Triage Vital Signs ED Triage Vitals  Enc Vitals Group     BP 06/28/20 1707 (!) 146/92     Pulse Rate 06/28/20 1706 88     Resp 06/28/20 1706 20     Temp 06/28/20 1706 98.4 F (36.9 C)     Temp src --      SpO2 06/28/20 1706 98 %      Weight --      Height --      Head Circumference --      Peak Flow --      Pain Score 06/28/20 1704 9     Pain Loc --      Pain Edu? --      Excl. in [place]? --    No data found.  Updated Vital Signs BP (!) 146/92   Pulse 88   Temp 98.4 F (36.9 C)   Resp 20   LMP 06/03/2020 (Approximate)   SpO2 98%   Breastfeeding No   Visual Acuity Right Eye Distance:   Left Eye Distance:   Bilateral Distance:    Right Eye Near:   Left Eye Near:    Bilateral Near:     Physical Exam Vitals and nursing note reviewed.  Constitutional:      Appearance: Normal appearance. She is not ill-appearing.  HENT:     Head: Atraumatic.     Mouth/Throat:     Mouth: Mucous membranes are moist.     Pharynx: Oropharynx is clear.  Eyes:     Extraocular Movements: Extraocular movements intact.     Conjunctiva/sclera: Conjunctivae normal.  Cardiovascular:     Rate and Rhythm: Normal rate and regular rhythm.     Heart sounds: Normal heart sounds.  Pulmonary:     Effort: Pulmonary effort is normal.     Breath sounds: Normal breath sounds.  Abdominal:     General: Bowel sounds are normal. There is no distension.     Palpations: Abdomen is soft. There is no mass.     Tenderness: There is abdominal tenderness. There is guarding. There is no right CVA tenderness, left CVA tenderness or rebound.     Comments: Moderate epigastric tenderness to palpation with mild guarding.  Musculoskeletal:        General: Normal range of motion.     Cervical back: Normal range of motion and neck supple.  Skin:    General: Skin is warm and dry.  Neurological:     Mental Status: She is alert and oriented to person, place, and time. Mental status is at baseline.  Psychiatric:        Mood and Affect: Mood normal.  Thought Content: Thought content normal.        Judgment: Judgment normal.      UC Treatments / Results  Labs (all labs ordered are listed, but only abnormal results are displayed) Labs  Reviewed  CBC WITH DIFFERENTIAL/PLATELET  COMPREHENSIVE METABOLIC PANEL  LIPASE, BLOOD    EKG   Radiology No results found.  Procedures Procedures (including critical care time)  Medications Ordered in UC Medications - No data to display  Initial Impression / Assessment and Plan / UC Course  I have reviewed the triage vital signs and the nursing notes.  Pertinent labs & imaging results that were available during my care of the patient were reviewed by me and considered in my medical decision making (see chart for details).     Mildly hypertensive in triage but otherwise vital signs stable today.  Suspect gastritis given her exam findings and history.  Will start Protonix, Carafate and obtain basic labs for rule out more serious causes.  She has a follow-up scheduled next week with her primary care for this issue and will use this as a recheck.  She knows to go to the ED for acutely worsening symptoms in the meantime.  Final Clinical Impressions(s) / UC Diagnoses   Final diagnoses:  Acute gastritis without hemorrhage, unspecified gastritis type  Non-intractable vomiting with nausea, unspecified vomiting type   Discharge Instructions   None    ED Prescriptions    Medication Sig Dispense Auth. Provider   pantoprazole (PROTONIX) 40 MG tablet Take 1 tablet (40 mg total) by mouth daily. 30 tablet Volney American, [place]   sucralfate (CARAFATE) 1 g tablet Take 1 tablet (1 g total) by mouth 3 (three) times daily as needed. 90 tablet Volney American, [place]     PDMP not reviewed this encounter.   Volney American, [place] 06/28/20 [phone]

## 2020-06-29 ENCOUNTER — Telehealth: Payer: Self-pay | Admitting: *Deleted

## 2020-06-29 ENCOUNTER — Emergency Department (HOSPITAL_COMMUNITY): Payer: Medicaid Other

## 2020-06-29 ENCOUNTER — Encounter (HOSPITAL_COMMUNITY): Payer: Self-pay | Admitting: Family Medicine

## 2020-06-29 ENCOUNTER — Inpatient Hospital Stay (HOSPITAL_COMMUNITY)
Admission: EM | Admit: 2020-06-29 | Discharge: 2020-07-03 | DRG: 418 | Disposition: A | Discharge status: Home / Self Care | Payer: Medicaid Other | Source: Ambulatory Visit | Attending: Internal Medicine | Admitting: Internal Medicine

## 2020-06-29 ENCOUNTER — Other Ambulatory Visit: Payer: Self-pay

## 2020-06-29 DIAGNOSIS — R1011 Right upper quadrant pain: Secondary | ICD-10-CM | POA: Diagnosis not present

## 2020-06-29 DIAGNOSIS — Z6841 Body Mass Index (BMI) 40.0 and over, adult: Secondary | ICD-10-CM | POA: Diagnosis not present

## 2020-06-29 DIAGNOSIS — K760 Fatty (change of) liver, not elsewhere classified: Secondary | ICD-10-CM | POA: Diagnosis present

## 2020-06-29 DIAGNOSIS — G8918 Other acute postprocedural pain: Secondary | ICD-10-CM | POA: Diagnosis not present

## 2020-06-29 DIAGNOSIS — K660 Peritoneal adhesions (postprocedural) (postinfection): Secondary | ICD-10-CM | POA: Diagnosis not present

## 2020-06-29 DIAGNOSIS — K5900 Constipation, unspecified: Secondary | ICD-10-CM | POA: Diagnosis present

## 2020-06-29 DIAGNOSIS — Z8719 Personal history of other diseases of the digestive system: Secondary | ICD-10-CM

## 2020-06-29 DIAGNOSIS — K8050 Calculus of bile duct without cholangitis or cholecystitis without obstruction: Secondary | ICD-10-CM | POA: Diagnosis present

## 2020-06-29 DIAGNOSIS — K8020 Calculus of gallbladder without cholecystitis without obstruction: Secondary | ICD-10-CM | POA: Diagnosis not present

## 2020-06-29 DIAGNOSIS — K8070 Calculus of gallbladder and bile duct without cholecystitis without obstruction: Secondary | ICD-10-CM | POA: Diagnosis not present

## 2020-06-29 DIAGNOSIS — K838 Other specified diseases of biliary tract: Secondary | ICD-10-CM | POA: Diagnosis present

## 2020-06-29 DIAGNOSIS — Z20822 Contact with and (suspected) exposure to covid-19: Secondary | ICD-10-CM | POA: Diagnosis present

## 2020-06-29 DIAGNOSIS — R7989 Other specified abnormal findings of blood chemistry: Secondary | ICD-10-CM | POA: Diagnosis not present

## 2020-06-29 DIAGNOSIS — Z79899 Other long term (current) drug therapy: Secondary | ICD-10-CM

## 2020-06-29 DIAGNOSIS — Z825 Family history of asthma and other chronic lower respiratory diseases: Secondary | ICD-10-CM | POA: Diagnosis not present

## 2020-06-29 DIAGNOSIS — Z833 Family history of diabetes mellitus: Secondary | ICD-10-CM | POA: Diagnosis not present

## 2020-06-29 DIAGNOSIS — Z419 Encounter for procedure for purposes other than remedying health state, unspecified: Secondary | ICD-10-CM

## 2020-06-29 DIAGNOSIS — K219 Gastro-esophageal reflux disease without esophagitis: Secondary | ICD-10-CM | POA: Diagnosis not present

## 2020-06-29 DIAGNOSIS — D649 Anemia, unspecified: Secondary | ICD-10-CM | POA: Diagnosis not present

## 2020-06-29 LAB — COMPREHENSIVE METABOLIC PANEL
ALT: 685 U/L — ABNORMAL HIGH (ref 0–44)
AST: 292 U/L — ABNORMAL HIGH (ref 15–41)
Albumin: 3.8 g/dL (ref 3.5–5.0)
Alkaline Phosphatase: 152 U/L — ABNORMAL HIGH (ref 38–126)
Anion gap: 11 (ref 5–15)
BUN: 8 mg/dL (ref 6–20)
CO2: 25 mmol/L (ref 22–32)
Calcium: 8.8 mg/dL — ABNORMAL LOW (ref 8.9–10.3)
Chloride: 99 mmol/L (ref 98–111)
Creatinine, Ser: 0.82 mg/dL (ref 0.44–1.00)
GFR, Estimated: >60 mL/min (ref >60)
Glucose, Bld: 107 mg/dL — ABNORMAL HIGH (ref 70–99)
Potassium: 3.6 mmol/L (ref 3.5–5.1)
Sodium: 135 mmol/L (ref 135–145)
Total Bilirubin: 5.6 mg/dL — ABNORMAL HIGH (ref 0.3–1.2)
Total Protein: 7 g/dL (ref 6.5–8.1)

## 2020-06-29 LAB — CBC
HCT: 41.9 % (ref 36.0–46.0)
Hemoglobin: 13.6 g/dL (ref 12.0–15.0)
MCH: 31.7 pg (ref 26.0–34.0)
MCHC: 32.5 g/dL (ref 30.0–36.0)
MCV: 97.7 fL (ref 80.0–100.0)
Platelets: 252 10*3/uL (ref 150–400)
RBC: 4.29 MIL/uL (ref 3.87–5.11)
RDW: 12.9 % (ref 11.5–15.5)
WBC: 8.3 10*3/uL (ref 4.0–10.5)
nRBC: 0 % (ref 0.0–0.2)

## 2020-06-29 LAB — HEPATITIS PANEL, ACUTE
HCV Ab: NONREACTIVE
Hep A IgM: NONREACTIVE
Hep B C IgM: NONREACTIVE
Hepatitis B Surface Ag: NONREACTIVE

## 2020-06-29 LAB — I-STAT BETA HCG BLOOD, ED (MC, WL, AP ONLY): I-stat hCG, quantitative: <5 m[IU]/mL (ref <5)

## 2020-06-29 LAB — LIPASE, BLOOD: Lipase: 42 U/L (ref 11–51)

## 2020-06-29 MED ORDER — FENTANYL CITRATE (PF) 100 MCG/2ML IJ SOLN
50.0000 ug | Freq: Once | INTRAMUSCULAR | Status: AC
  Administered 2020-06-29: 50 ug via INTRAVENOUS
  Filled 2020-06-29: qty 2

## 2020-06-29 MED ORDER — PIPERACILLIN-TAZOBACTAM 3.375 G IVPB 30 MIN
3.3750 g | Freq: Once | INTRAVENOUS | Status: AC
  Administered 2020-06-29: 3.375 g via INTRAVENOUS
  Filled 2020-06-29: qty 50

## 2020-06-29 MED ORDER — ONDANSETRON HCL 4 MG/2ML IJ SOLN
4.0000 mg | Freq: Once | INTRAMUSCULAR | Status: AC
  Administered 2020-06-29: 4 mg via INTRAVENOUS
  Filled 2020-06-29: qty 2

## 2020-06-29 MED ORDER — DIAZEPAM 5 MG/ML IJ SOLN
5.0000 mg | Freq: Once | INTRAMUSCULAR | Status: AC
  Administered 2020-06-29: 5 mg via INTRAVENOUS
  Filled 2020-06-29: qty 2

## 2020-06-29 NOTE — ED Provider Notes (Signed)
[facility] EM[facility]GENCY DEPARTMENT Provider Note   CSN: [id number] Arrival date & time: 06/29/20  7829     History Chief Complaint  Patient presents with  . Abdominal Pain    Myrl Bynum is a 25 y.o. female.  The history is provided by the patient.  Abdominal Pain Pain location:  RUQ and epigastric Pain quality: sharp and stabbing   Pain radiates to:  Does not radiate Pain severity:  Moderate Onset quality:  Sudden Duration:  5 days Timing:  Constant Progression:  Unchanged Chronicity:  New Context: not previous surgeries, not recent illness, not sick contacts and not suspicious food intake   Relieved by:  Nothing Worsened by:  Eating Ineffective treatments:  Position changes Associated symptoms: chills, constipation, nausea and vomiting   Associated symptoms: no chest pain, no cough, no dysuria, no fever, no hematuria, no shortness of breath and no sore throat        Past Medical History:  Diagnosis Date  . Heart murmur    AS A BABY  . Morbid obesity [facility])     Patient Active Problem List   Diagnosis Date Noted  . Labor and delivery, indication for care 03/23/2019  . 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 Rh negative state in antepartum period 09/12/2018  . Maternal morbid obesity, antepartum ([place]) 09/10/2018  . Supervision of normal first pregnancy 08/28/2018    Past Surgical History:  Procedure Laterality Date  . NO PAST SURGERIES       OB History    Gravida  1   Para  1   Term  1   Preterm      AB      Living  1     SAB      IAB      Ectopic      Multiple  0   Live Births  1           Family History  Problem Relation Age of Onset  . Deep vein thrombosis Mother   . Kidney Stones Mother   . Diabetes Father   . Asthma Brother   . Arthritis Paternal Uncle   . Cancer Paternal Uncle   . Diabetes Paternal Grandmother     Social History   Tobacco Use  . Smoking status: Never Smoker  . Smokeless tobacco:  Never Used  Vaping Use  . Vaping Use: Never used  Substance Use Topics  . Alcohol use: Not Currently    Comment: occ, events  . Drug use: Never    Home Medications Prior to Admission medications   Medication Sig Start Date End Date Taking? Authorizing Provider  acetaminophen (TYLENOL) 325 MG tablet Take 2 tablets (650 mg total) by mouth every 4 (four) hours as needed (for pain scale < 4). 03/25/19   Clarnce Flock, MD  Blood Pressure Monitor KIT 1 kit by Does not apply route once a week. Check BP weekly.  Large Cuff. DX:  Z13.6         Z34.86 09/10/18   Anyanwu, Sallyanne Havers, MD  cyclobenzaprine (FLEXERIL) 5 MG tablet Take 1 tablet (5 mg total) by mouth 3 (three) times daily as needed for muscle spasms. 01/17/19   Jorje Guild, NP  Elastic Bandages & Supports (COMFORT FIT MATERNITY SUPP SM) MISC Wear as directed. 02/05/19   Shelly Bombard, MD  ferrous sulfate 325 (65 FE) MG tablet Take 1 tablet (325 mg total) by mouth every other day. 03/27/19   Dione Plover,  Annice Needy, MD  ibuprofen (ADVIL) 600 MG tablet Take 1 tablet (600 mg total) by mouth every 6 (six) hours. 03/25/19   Clarnce Flock, MD  norethindrone (ORTHO MICRONOR) 0.35 MG tablet Take 1 tablet (0.35 mg total) by mouth daily. 03/25/19 03/24/20  Clarnce Flock, MD  omeprazole (PRILOSEC) 20 MG capsule Take 1 capsule (20 mg total) by mouth daily. 01/17/19   Jorje Guild, NP  pantoprazole (PROTONIX) 40 MG tablet Take 1 tablet (40 mg total) by mouth daily. 06/28/20   Volney American, PA-C  Prenatal Vit w/Fe-Methylfol-FA (PNV PO) Take by mouth.    [provider]  sucralfate (CARAFATE) 1 g tablet Take 1 tablet (1 g total) by mouth 3 (three) times daily as needed. 06/28/20   Volney American, PA-C    Allergies    Patient has no known allergies.  Review of Systems   Review of Systems  Constitutional: Positive for chills. Negative for fever.  HENT: Negative for ear pain and sore throat.   Eyes: Negative for pain  and visual disturbance.  Respiratory: Negative for cough and shortness of breath.   Cardiovascular: Negative for chest pain and palpitations.  Gastrointestinal: Positive for abdominal pain, constipation, nausea and vomiting.  Genitourinary: Negative for dysuria and hematuria.  Musculoskeletal: Negative for arthralgias and back pain.  Skin: Negative for color change and rash.  Neurological: Negative for seizures and syncope.  All other systems reviewed and are negative.   Physical Exam Updated Vital Signs BP (!) 127/99 (BP Location: Right Arm)   Pulse 85   Temp 98.2 F (36.8 C) (Oral)   Resp 17   LMP 06/03/2020 (Approximate)   SpO2 100%   Physical Exam Vitals and nursing note reviewed.  HENT:     Head: Normocephalic and atraumatic.  Eyes:     General: Scleral icterus present.  Pulmonary:     Effort: Pulmonary effort is normal. No respiratory distress.  Abdominal:     Palpations: Abdomen is soft.     Tenderness: There is abdominal tenderness in the right upper quadrant and epigastric area. Positive signs include Murphy's sign.  Musculoskeletal:     Cervical back: Normal range of motion.  Skin:    General: Skin is warm and dry.  Neurological:     Mental Status: She is alert.  Psychiatric:        Mood and Affect: Mood normal.     ED Results / Procedures / Treatments   Labs (all labs ordered are listed, but only abnormal results are displayed) Labs Reviewed  COMPREHENSIVE METABOLIC PANEL - Abnormal; Notable for the following components:      Result Value   Glucose, Bld 107 (*)    Calcium 8.8 (*)    AST 292 (*)    ALT 685 (*)    Alkaline Phosphatase 152 (*)    Total Bilirubin 5.6 (*)    All other components within normal limits  RESP PANEL BY RT-PCR (FLU A&B, COVID) ARPGX2  LIPASE, BLOOD  CBC  HEPATITIS PANEL, ACUTE  URINALYSIS, ROUTINE W REFLEX MICROSCOPIC  I-STAT BETA HCG BLOOD, ED ([facility], [facility], [facility] ONLY)    EKG None  Radiology MR ABDOMEN MRCP WO  CONTRAST  Result Date: 06/29/2020 CLINICAL DATA:  Jaundice EXAM: MRI ABDOMEN WITHOUT CONTRAST  (INCLUDING MRCP) TECHNIQUE: Multiplanar multisequence MR imaging of the abdomen was performed. Heavily T2-weighted images of the biliary and pancreatic ducts were obtained, and three-dimensional MRCP images were rendered by post processing. COMPARISON:  Same day  abdominal ultrasound. FINDINGS: Limited sequences obtained due to patient's inability to complete the study secondary to panic attack. Lower chest: No acute findings. Hepatobiliary: No hepatic steatosis. No suspicious hepatic lesion. Numerous small cholelithiasis in a distended gallbladder without wall thickening or pericholecystic fluid. There are multiple tiny round 2-3 mm filling defects within the common bile duct with extrahepatic and proximal intrahepatic biliary ductal dilation, with the common duct measuring 8 mm on image 20/6. Pancreas: No mass, inflammatory changes, or other parenchymal abnormality identified. Spleen:  Within normal limits in size and appearance. Adrenals/Urinary Tract: No masses identified. No evidence of hydronephrosis. Stomach/Bowel: Visualized portions within the abdomen are unremarkable. Vascular/Lymphatic: No pathologically enlarged lymph nodes identified. No abdominal aortic aneurysm demonstrated. Other:  None. Musculoskeletal: No suspicious bone lesions identified. IMPRESSION: Limited sequences obtained due to patient's inability to complete the study secondary to panic attack. Within this contracts: 1. Numerous tiny choledocholithiasis with mild extra and proximal intrahepatic biliary ductal dilation and the common duct measuring 8 mm. 2. Numerous small cholelithiasis in a distended gallbladder without wall thickening or pericholecystic fluid. Electronically Signed   By: Dahlia Bailiff MD   On: 06/29/2020 22:26   US Abdomen Limited RUQ (LIVER/GB)  Result Date: 06/29/2020 CLINICAL DATA:  Right upper quadrant pain for 5 days.  EXAM: ULTRASOUND ABDOMEN LIMITED RIGHT UPPER QUADRANT COMPARISON:  None. FINDINGS: Gallbladder: Stone and sludge filled gallbladder. No wall thickening or pericholecystic fluid. The technologist describes tenderness with gallbladder palpation. Common bile duct: Diameter: Mildly dilated at 8 mm. Possible central intrahepatic biliary duct dilatation Liver: Mildly increased in echogenicity. Portal vein is patent on color Doppler imaging with normal direction of blood flow towards the liver. Other: No ascites. Mild degradation secondary to patient body habitus. IMPRESSION: 1. Cholelithiasis. Although there is no wall thickening or pericholecystic fluid, the technologist describes tenderness with gallbladder palpation . Acute cholecystitis cannot be excluded. 2. Mild common duct and possible intrahepatic duct dilatation. If the bilirubin is elevated, choledocholithiasis would be a concern and further evaluation with MRCP or ERCP suggested. 3. Increased hepatic echogenicity, suggesting steatosis Electronically Signed   By: Abigail Miyamoto M.D.   On: 06/29/2020 19:27    Procedures Procedures   Medications Ordered in ED Medications  ondansetron (ZOFRAN) injection 4 mg (4 mg Intravenous Given 06/29/20 2004)  fentaNYL (SUBLIMAZE) injection 50 mcg (50 mcg Intravenous Given 06/29/20 2004)  piperacillin-tazobactam (ZOSYN) IVPB 3.375 g (0 g Intravenous Stopped 06/29/20 2033)  diazepam (VALIUM) injection 5 mg (5 mg Intravenous Given 06/29/20 2106)  ondansetron (ZOFRAN) injection 4 mg (4 mg Intravenous Given 06/29/20 2222)  fentaNYL (SUBLIMAZE) injection 50 mcg (50 mcg Intravenous Given 06/29/20 2244)    ED Course  I have reviewed the triage vital signs and the nursing notes.  Pertinent labs & imaging results that were available during my care of the patient were reviewed by me and considered in my medical decision making (see chart for details).  Clinical Course as of 06/29/20 2314  Mon Jun 29, 2020  2252 I spoke  with [facility]. Patient will be NPO after MN and plans for ERCP tomorrow. [AW]    Clinical Course User Index [AW] Arnaldo Natal, MD   MDM Rules/Calculators/A&P                          [place] presented to the ED with nausea, vomiting, and upper abdominal pain.  She had been diagnosed with elevated liver enzymes at urgent care.  She was evaluated for evidence of biliary pathology here in the ED.  Right upper quadrant ultrasound was somewhat equivocal, and MRCP was performed.  She was given antibiotics, antiemetics, and pain medication and will be admitted to the hospital for further treatment. Final Clinical Impression(s) / ED Diagnoses Final diagnoses:  RUQ pain  Choledocholithiasis    Rx / DC Orders ED Discharge Orders    None       Arnaldo Natal, MD 06/29/20 2330

## 2020-06-29 NOTE — ED Notes (Signed)
Patient transported to MRI 

## 2020-06-29 NOTE — H&P (Signed)
History and Physical    Connie Fisher BOY:[id number] DOB: [birth year] DOA: 06/29/2020  PCP: Pcp, No   Patient coming from: Home   Chief Complaint: Upper abdominal pain and N/V   HPI: Connie Fisher is a 25 y.o. female with medical history significant for BMI 55, presenting to the emergency department for evaluation of epigastric pain, nausea, and vomiting.  Patient reports that she developed nausea approximately 5 days ago and has been having epigastric pain and vomiting for the past 4 days.  She was evaluated at an urgent care where she was noted to have elevated LFTs and directed to the ED.  Pain is localized to epigastrium and moderate to severe in intensity. She denies fevers.   ED Course: Upon arrival to the ED, patient is found to be afebrile, saturating well on room air, and with stable blood pressure.  Chemistry panel notable for elevated transaminases and bilirubin.  CBC is unremarkable.  MRCP remarkable for choledocholithiasis.  Patient was treated with fentanyl and Zofran in the ED, gastroenterology was consulted, and hospitalists asked to admit.  Review of Systems:  All other systems reviewed and apart from HPI, are negative.  Past Medical History:  Diagnosis Date  . Heart murmur    AS A BABY  . Morbid obesity (HCC)     Past Surgical History:  Procedure Laterality Date  . NO PAST SURGERIES      Social History:   reports that she has never smoked. She has never used smokeless tobacco. She reports previous alcohol use. She reports that she does not use drugs.  No Known Allergies  Family History  Problem Relation Age of Onset  . Deep vein thrombosis Mother   . Kidney Stones Mother   . Diabetes Father   . Asthma Brother   . Arthritis Paternal Uncle   . Cancer Paternal Uncle   . Diabetes Paternal Grandmother      Prior to Admission medications   Medication Sig Start Date End Date Taking? Authorizing Provider  acetaminophen (TYLENOL) 325 MG tablet Take 2 tablets  (650 mg total) by mouth every 4 (four) hours as needed (for pain scale < 4). 03/25/19   Venora Maples, MD  Blood Pressure Monitor KIT 1 kit by Does not apply route once a week. Check BP weekly.  Large Cuff. DX:  Z13.6         Z34.86 09/10/18   Anyanwu, Jethro Bastos, MD  cyclobenzaprine (FLEXERIL) 5 MG tablet Take 1 tablet (5 mg total) by mouth 3 (three) times daily as needed for muscle spasms. 01/17/19   Judeth Horn, NP  Elastic Bandages & Supports (COMFORT FIT MATERNITY SUPP SM) MISC Wear as directed. 02/05/19   Brock Bad, MD  ferrous sulfate 325 (65 FE) MG tablet Take 1 tablet (325 mg total) by mouth every other day. 03/27/19   Venora Maples, MD  ibuprofen (ADVIL) 600 MG tablet Take 1 tablet (600 mg total) by mouth every 6 (six) hours. 03/25/19   Venora Maples, MD  norethindrone (ORTHO MICRONOR) 0.35 MG tablet Take 1 tablet (0.35 mg total) by mouth daily. 03/25/19 03/24/20  Venora Maples, MD  omeprazole (PRILOSEC) 20 MG capsule Take 1 capsule (20 mg total) by mouth daily. 01/17/19   Judeth Horn, NP  pantoprazole (PROTONIX) 40 MG tablet Take 1 tablet (40 mg total) by mouth daily. 06/28/20   Particia Nearing, PA-C  Prenatal Vit w/Fe-Methylfol-FA (PNV PO) Take by mouth.    [provider]  sucralfate (CARAFATE) 1 g tablet Take 1 tablet (1 g total) by mouth 3 (three) times daily as needed. 06/28/20   Volney American, PA-C    Physical Exam: Vitals:   06/29/20 2030 06/29/20 2105 06/29/20 2211 06/29/20 2315  BP: (!) 103/51 (!) 103/51 117/74 106/74  Pulse: 76 77 73 67  Resp: (!) 21 (!) _0 Temp:      TempSrc:      SpO2: 99% 98% 100% 100%    Constitutional: NAD, calm  Eyes: PERTLA, lids and conjunctivae normal ENMT: Mucous membranes are moist. Posterior pharynx clear of any exudate or lesions.   Neck: supple, no masses  Respiratory:  no wheezing, no crackles. No accessory muscle use.  Cardiovascular: S1 & S2 heard, regular rate and rhythm. No  extremity edema.   Abdomen: No distension, soft, tender in epigastrium. Bowel sounds active.  Musculoskeletal: no clubbing / cyanosis. No joint deformity upper and lower extremities.   Skin: no significant rashes, lesions, ulcers. Warm, dry, well-perfused. Neurologic: CN 2-12 grossly intact. Sensation intact. Moving all extremities.  Psychiatric: Alert and oriented to person, place, and situation. Pleasant and cooperative.    Labs and Imaging on Admission: I have personally reviewed following labs and imaging studies  CBC: Recent Labs  Lab 06/26/20 2101 06/28/20 1900 06/29/20 1628  WBC 12.3* 8.7 8.3  NEUTROABS 9.5* 6.0  --   HGB 13.6 13.4 13.6  HCT 41.6 40.3 41.9  MCV 95.2 94.2 97.7  PLT 262 256 836   Basic Metabolic Panel: Recent Labs  Lab 06/26/20 2101 06/28/20 1900 06/29/20 1628  NA 137 137 135  K 3.6 4.0 3.6  CL 105 105 99  CO2 _1 GLUCOSE 103* 99 107*  BUN 7 5* 8  CREATININE 0.63 0.67 0.82  CALCIUM 8.9 9.0 8.8*   GFR: Estimated Creatinine Clearance: 144.9 mL/min (by C-G formula based on SCr of 0.82 mg/dL). Liver Function Tests: Recent Labs  Lab 06/26/20 2101 06/28/20 1900 06/29/20 1628  AST 241* 509* 292*  ALT 251* 845* 685*  ALKPHOS 86 143* 152*  BILITOT 0.9 3.4* 5.6*  PROT 7.1 7.0 7.0  ALBUMIN 4.0 3.9 3.8   Recent Labs  Lab 06/26/20 2101 06/28/20 1900 06/29/20 1628  LIPASE 34 31 42   No results for input(s): AMMONIA in the last 168 hours. Coagulation Profile: No results for input(s): INR, PROTIME in the last 168 hours. Cardiac Enzymes: No results for input(s): CKTOTAL, CKMB, CKMBINDEX, TROPONINI in the last 168 hours. BNP (last 3 results) No results for input(s): PROBNP in the last 8760 hours. HbA1C: No results for input(s): HGBA1C in the last 72 hours. CBG: No results for input(s): GLUCAP in the last 168 hours. Lipid Profile: No results for input(s): CHOL, HDL, LDLCALC, TRIG, CHOLHDL, LDLDIRECT in the last 72 hours. Thyroid  Function Tests: No results for input(s): TSH, T4TOTAL, FREET4, T3FREE, THYROIDAB in the last 72 hours. Anemia Panel: No results for input(s): VITAMINB12, FOLATE, FERRITIN, TIBC, IRON, RETICCTPCT in the last 72 hours. Urine analysis:    Component Value Date/Time   COLORURINE YELLOW 06/26/2020 2059   APPEARANCEUR HAZY (A) 06/26/2020 2059   LABSPEC 1.026 06/26/2020 2059   PHURINE 5.0 06/26/2020 2059   GLUCOSEU NEGATIVE 06/26/2020 2059   HGBUR NEGATIVE 06/26/2020 2059   BILIRUBINUR NEGATIVE 06/26/2020 2059   KETONESUR NEGATIVE 06/26/2020 2059   PROTEINUR NEGATIVE 06/26/2020 2059   NITRITE NEGATIVE 06/26/2020 2059   LEUKOCYTESUR NEGATIVE 06/26/2020 2059   Sepsis Labs: _2 (procalcitonin:4,lacticidven:4) )No  results found for this or any previous visit (from the past 240 hour(s)).   Radiological Exams on Admission: MR ABDOMEN MRCP WO CONTRAST  Result Date: 06/29/2020 CLINICAL DATA:  Jaundice EXAM: MRI ABDOMEN WITHOUT CONTRAST  (INCLUDING MRCP) TECHNIQUE: Multiplanar multisequence MR imaging of the abdomen was performed. Heavily T2-weighted images of the biliary and pancreatic ducts were obtained, and three-dimensional MRCP images were rendered by post processing. COMPARISON:  Same day abdominal ultrasound. FINDINGS: Limited sequences obtained due to patient's inability to complete the study secondary to panic attack. Lower chest: No acute findings. Hepatobiliary: No hepatic steatosis. No suspicious hepatic lesion. Numerous small cholelithiasis in a distended gallbladder without wall thickening or pericholecystic fluid. There are multiple tiny round 2-3 mm filling defects within the common bile duct with extrahepatic and proximal intrahepatic biliary ductal dilation, with the common duct measuring 8 mm on image 20/6. Pancreas: No mass, inflammatory changes, or other parenchymal abnormality identified. Spleen:  Within normal limits in size and appearance. Adrenals/Urinary Tract: No masses  identified. No evidence of hydronephrosis. Stomach/Bowel: Visualized portions within the abdomen are unremarkable. Vascular/Lymphatic: No pathologically enlarged lymph nodes identified. No abdominal aortic aneurysm demonstrated. Other:  None. Musculoskeletal: No suspicious bone lesions identified. IMPRESSION: Limited sequences obtained due to patient's inability to complete the study secondary to panic attack. Within this contracts: 1. Numerous tiny choledocholithiasis with mild extra and proximal intrahepatic biliary ductal dilation and the common duct measuring 8 mm. 2. Numerous small cholelithiasis in a distended gallbladder without wall thickening or pericholecystic fluid. Electronically Signed   By: Dahlia Bailiff MD   On: 06/29/2020 22:26   US Abdomen Limited RUQ (LIVER/GB)  Result Date: 06/29/2020 CLINICAL DATA:  Right upper quadrant pain for 5 days. EXAM: ULTRASOUND ABDOMEN LIMITED RIGHT UPPER QUADRANT COMPARISON:  None. FINDINGS: Gallbladder: Stone and sludge filled gallbladder. No wall thickening or pericholecystic fluid. The technologist describes tenderness with gallbladder palpation. Common bile duct: Diameter: Mildly dilated at 8 mm. Possible central intrahepatic biliary duct dilatation Liver: Mildly increased in echogenicity. Portal vein is patent on color Doppler imaging with normal direction of blood flow towards the liver. Other: No ascites. Mild degradation secondary to patient body habitus. IMPRESSION: 1. Cholelithiasis. Although there is no wall thickening or pericholecystic fluid, the technologist describes tenderness with gallbladder palpation . Acute cholecystitis cannot be excluded. 2. Mild common duct and possible intrahepatic duct dilatation. If the bilirubin is elevated, choledocholithiasis would be a concern and further evaluation with MRCP or ERCP suggested. 3. Increased hepatic echogenicity, suggesting steatosis Electronically Signed   By: Abigail Miyamoto M.D.   On: 06/29/2020 19:27     Assessment/Plan  1. Choledocholithiasis  - Presents with epigastric pain and N/V and is found to have elevated transaminases and bilirubin with choledocholithiasis on MRCP  - No evidence for infection or pancreatitis  - GI consulting and much appreciated  - Continue bowel-rest, IVF hydration, pain-control    DVT prophylaxis: SCDs  Code Status: Full  Level of Care: Level of care: Med-Surg Family Communication: Significant other updated at bedside Disposition Plan:  Patient is from: home  Anticipated d/c is to: Home  Anticipated d/c date is: 07/01/20 Patient currently: Pending GI consultation  Consults called: GI  Admission status: Inpatient     Vianne Bulls, MD [facility]  06/30/2020, 12:50 AM

## 2020-06-29 NOTE — Telephone Encounter (Signed)
Transition Care Management Follow-up Telephone Call  Date of discharge and from where: 06/27/2020 - [facility]  How have you been since you were released from the hospital? "About the same"  Any questions or concerns? No  Items Reviewed:  Did the pt receive and understand the discharge instructions provided? Yes   Medications obtained and verified? Yes   Other? No   Any new allergies since your discharge? No   Dietary orders reviewed? No  Do you have support at home? Yes    Functional Questionnaire: (I = Independent and D = Dependent) ADLs: I  Bathing/Dressing- I  Meal Prep- I  Eating- I  Maintaining continence- I  Transferring/Ambulation- I  Managing Meds- I  Follow up appointments reviewed:   PCP Hospital f/u appt confirmed? No    Specialist Hospital f/u appt confirmed? No    Are transportation arrangements needed? N/A  If their condition worsens, is the pt aware to call PCP or go to the Emergency Dept.? Yes  Was the patient provided with contact information for the PCP's office or ED? Yes  Was to pt encouraged to call back with questions or concerns? Yes

## 2020-06-29 NOTE — ED Triage Notes (Signed)
Pt reports central abdominal pain and n/v x 5 days. Last good BM 3 days ago, reports only yellow colored stool since then. Went to UC who sent her here for further eval d/t elevated liver enzymes.

## 2020-06-29 NOTE — ED Triage Notes (Signed)
Emergency Medicine Provider Triage Evaluation Note  Connie Fisher , a 25 y.o. female  was evaluated in triage.  Pt complains of epigastric abdominal pain associated with nausea and vomiting which is worse after eating. Evaluated at [facility] yesterday and called today and told to report to the ED due to elevated LFTs. She has had this issue before during pregnancy. No chronic tylenol. Occasional alcohol use.   Review of Systems  Positive: Abdominal pain, N/V Negative: dysuria  Physical Exam  BP 122/80 (BP Location: Left Arm)   Pulse 98   Temp 98.2 F (36.8 C) (Oral)   Resp 14   LMP 06/03/2020 (Approximate)   SpO2 99%  Gen:   Awake, no distress   HEENT:  Atraumatic  Resp:  Normal effort  Cardiac:  Normal rate  Abd:   Nondistended, epigastric and RUQ tenderness MSK:   Moves extremities without difficulty  Neuro:  Speech clear   Medical Decision Making  Medically screening exam initiated at 4:32 PM.  Appropriate orders placed.  Connie Fisher was informed that the remainder of the evaluation will be completed by another provider, this initial triage assessment does not replace that evaluation, and the importance of remaining in the ED until their evaluation is complete.  Clinical Impression  Epigastric/RUQ TTP with elevated LFTs, concern for gallbladder etiology. Labs and [place] ordered. Hepatitis panel ordered.   Connie Fisher, [place] 06/29/20 1635

## 2020-06-30 ENCOUNTER — Encounter (HOSPITAL_COMMUNITY): Payer: Self-pay | Admitting: Family Medicine

## 2020-06-30 DIAGNOSIS — R1011 Right upper quadrant pain: Secondary | ICD-10-CM | POA: Diagnosis not present

## 2020-06-30 DIAGNOSIS — K8050 Calculus of bile duct without cholangitis or cholecystitis without obstruction: Secondary | ICD-10-CM | POA: Diagnosis not present

## 2020-06-30 DIAGNOSIS — R7989 Other specified abnormal findings of blood chemistry: Secondary | ICD-10-CM

## 2020-06-30 LAB — COMPREHENSIVE METABOLIC PANEL
ALT: 631 U/L — ABNORMAL HIGH (ref 0–44)
AST: 308 U/L — ABNORMAL HIGH (ref 15–41)
Albumin: 3.3 g/dL — ABNORMAL LOW (ref 3.5–5.0)
Alkaline Phosphatase: 137 U/L — ABNORMAL HIGH (ref 38–126)
Anion gap: 9 (ref 5–15)
BUN: 8 mg/dL (ref 6–20)
CO2: 21 mmol/L — ABNORMAL LOW (ref 22–32)
Calcium: 8.5 mg/dL — ABNORMAL LOW (ref 8.9–10.3)
Chloride: 106 mmol/L (ref 98–111)
Creatinine, Ser: 0.66 mg/dL (ref 0.44–1.00)
GFR, Estimated: >60 mL/min (ref >60)
Glucose, Bld: 101 mg/dL — ABNORMAL HIGH (ref 70–99)
Potassium: 4 mmol/L (ref 3.5–5.1)
Sodium: 136 mmol/L (ref 135–145)
Total Bilirubin: 4.7 mg/dL — ABNORMAL HIGH (ref 0.3–1.2)
Total Protein: 6.1 g/dL — ABNORMAL LOW (ref 6.5–8.1)

## 2020-06-30 LAB — CBC
HCT: 37 % (ref 36.0–46.0)
Hemoglobin: 12.2 g/dL (ref 12.0–15.0)
MCH: 31.4 pg (ref 26.0–34.0)
MCHC: 33 g/dL (ref 30.0–36.0)
MCV: 95.1 fL (ref 80.0–100.0)
Platelets: 228 10*3/uL (ref 150–400)
RBC: 3.89 MIL/uL (ref 3.87–5.11)
RDW: 13.1 % (ref 11.5–15.5)
WBC: 9 10*3/uL (ref 4.0–10.5)
nRBC: 0 % (ref 0.0–0.2)

## 2020-06-30 LAB — HIV ANTIBODY (ROUTINE TESTING W REFLEX): HIV Screen 4th Generation wRfx: NONREACTIVE

## 2020-06-30 LAB — RESP PANEL BY RT-PCR (FLU A&B, COVID) ARPGX2
Influenza A by PCR: NEGATIVE
Influenza B by PCR: NEGATIVE
SARS Coronavirus 2 by RT PCR: NEGATIVE

## 2020-06-30 MED ORDER — ONDANSETRON HCL 4 MG PO TABS: 4.0000 mg | ORAL_TABLET | Freq: Four times a day (QID) | ORAL | Status: DC | PRN

## 2020-06-30 MED ORDER — ONDANSETRON HCL 4 MG/2ML IJ SOLN
4.0000 mg | Freq: Four times a day (QID) | INTRAMUSCULAR | Status: DC | PRN
  Administered 2020-06-30 (×2): 4 mg via INTRAVENOUS
  Filled 2020-06-30 (×3): qty 2

## 2020-06-30 MED ORDER — ACETAMINOPHEN 650 MG RE SUPP: 650.0000 mg | Freq: Four times a day (QID) | RECTAL | Status: DC | PRN

## 2020-06-30 MED ORDER — FENTANYL CITRATE (PF) 100 MCG/2ML IJ SOLN
25.0000 ug | INTRAMUSCULAR | Status: DC | PRN
  Administered 2020-06-30 – 2020-07-01 (×8): 50 ug via INTRAVENOUS
  Filled 2020-06-30 (×9): qty 2

## 2020-06-30 MED ORDER — SODIUM CHLORIDE 0.9 % IV SOLN
3.0000 g | INTRAVENOUS | Status: AC
  Administered 2020-07-01: 3 g via INTRAVENOUS
  Filled 2020-06-30: qty 3

## 2020-06-30 MED ORDER — ACETAMINOPHEN 325 MG PO TABS
650.0000 mg | ORAL_TABLET | Freq: Four times a day (QID) | ORAL | Status: DC | PRN
  Administered 2020-06-30: 650 mg via ORAL
  Filled 2020-06-30: qty 2

## 2020-06-30 MED ORDER — INDOMETHACIN 50 MG RE SUPP
100.0000 mg | RECTAL | Status: DC
  Filled 2020-06-30: qty 2

## 2020-06-30 MED ORDER — SODIUM CHLORIDE 0.9 % IV SOLN: INTRAVENOUS | Status: AC

## 2020-06-30 NOTE — Progress Notes (Signed)
Pt presesnts VSS, c/o ab pain and nausea, fent and zofran given with positive results, pt resting comfortably, continuing to assess and monitor

## 2020-06-30 NOTE — Consult Note (Addendum)
[facility] Consult: 10:47 AM 06/30/2020  LOS: 1 day    Referring Provider: Dr Marlowe Sax  Primary Care Physician:  Pcp, No Primary Gastroenterologist:  unassigned     Reason for Consultation: Choledocholithiasis.   HPI: Connie Fisher is a 25 y.o. female.  Morbidly obese with BMI of 55.  Vaginal delivery of first pregnancy 03/25/2019. Lifelong background of constipation although she has fairly regular bowel movements she often strains to pass these.  Seen at  triage on 4/22 with 3 days of chest, abdominal pain, nausea, vomiting, constipation.  Epigastric pain worse after meals.  Emesis consisting of stomach contents but no coffee-ground or blood.  She was initially evaluated by the PA in the ED and had lab work but no imaging.  Ended up leaving the hospital before another provider returned because she had family matters to take care of. Return to the urgent care 2 days later, 2 days ago.  Persistence of same symptoms.  Had no relief with MiraLAX, Gas-X, Tums. Referred by urgent care to the ED when they discovered elevated LFTs. No fever.  No tachycardia.  Blood pressure soft, currently 100/47.  Excellent room air saturations.  Labs trending since her initial encounter on 4/22 through today as follows. Lipase 42.  T bili 7.1 >> 6.1.  Alk phos 6 >> 150 >> 37.  AST/ALT 241/251 >> 509/845 >> 308/631 WBCs, Hb, platelets normal. Abdominal ultrasound: Cholelithiasis, no gallbladder wall thickening or edema.  8 mm diameter CBD, mild dilation.  Suggestion of hepatic steatosis. MRCP: Tiny stones in CBD with mild extra and proximal intrahepatic ductal dilatation.  CBD 8 mm.  Numerous small gallbladder stones, GB distended but no wall thickening or pericholecystic fluid.  Her mother ended up having her gallbladder out soon  after giving birth to the patient probably around age 51.   Nephrolithiasis in mother, maternal aunt and maternal grandmother.    Past Medical History:  Diagnosis Date  . Heart murmur    AS A BABY  . Morbid obesity ([place])     Past Surgical History:  Procedure Laterality Date  . NO PAST SURGERIES      Prior to Admission medications   Medication Sig Start Date End Date Taking? Authorizing Provider  acetaminophen (TYLENOL) 325 MG tablet Take 2 tablets (650 mg total) by mouth every 4 (four) hours as needed (for pain scale < 4). 03/25/19   Clarnce Flock, MD  Blood Pressure Monitor KIT 1 kit by Does not apply route once a week. Check BP weekly.  Large Cuff. DX:  Z13.6         Z34.86 09/10/18   Anyanwu, Sallyanne Havers, MD  cyclobenzaprine (FLEXERIL) 5 MG tablet Take 1 tablet (5 mg total) by mouth 3 (three) times daily as needed for muscle spasms. 01/17/19   Jorje Guild, NP  Elastic Bandages & Supports (COMFORT FIT MATERNITY SUPP SM) MISC Wear as directed. 02/05/19   Shelly Bombard, MD  ferrous sulfate 325 (65 FE) MG tablet Take 1 tablet (325 mg total) by mouth every other day.  03/27/19   Clarnce Flock, MD  ibuprofen (ADVIL) 600 MG tablet Take 1 tablet (600 mg total) by mouth every 6 (six) hours. 03/25/19   Clarnce Flock, MD  norethindrone (ORTHO MICRONOR) 0.35 MG tablet Take 1 tablet (0.35 mg total) by mouth daily. 03/25/19 03/24/20  Clarnce Flock, MD  omeprazole (PRILOSEC) 20 MG capsule Take 1 capsule (20 mg total) by mouth daily. 01/17/19   Jorje Guild, NP  pantoprazole (PROTONIX) 40 MG tablet Take 1 tablet (40 mg total) by mouth daily. 06/28/20   Volney American, PA-C  Prenatal Vit w/Fe-Methylfol-FA (PNV PO) Take by mouth.    [provider]  sucralfate (CARAFATE) 1 g tablet Take 1 tablet (1 g total) by mouth 3 (three) times daily as needed. 06/28/20   Volney American, PA-C    Scheduled Meds:  Infusions: . sodium chloride 100 mL/hr at 06/30/20 0951    PRN Meds: acetaminophen **OR** acetaminophen, fentaNYL (SUBLIMAZE) injection, ondansetron **OR** ondansetron (ZOFRAN) IV   Allergies as of 06/29/2020  . (No Known Allergies)    Family History  Problem Relation Age of Onset  . Deep vein thrombosis Mother   . Kidney Stones Mother   . Diabetes Father   . Asthma Brother   . Arthritis Paternal Uncle   . Cancer Paternal Uncle   . Diabetes Paternal Grandmother     Social History   Socioeconomic History  . Marital status: Single    Spouse name: Not on file  . Number of children: Not on file  . Years of education: Not on file  . Highest education level: Not on file  Occupational History  . Occupation: UNEMPLOYED  Tobacco Use  . Smoking status: Never Smoker  . Smokeless tobacco: Never Used  Vaping Use  . Vaping Use: Never used  Substance and Sexual Activity  . Alcohol use: Not Currently    Comment: occ, events  . Drug use: Never  . Sexual activity: Yes  Other Topics Concern  . Not on file  Social History Narrative  . Not on file   Social Determinants of Health   Financial Resource Strain: Not on file  Food Insecurity: Not on file  Transportation Needs: Not on file  Physical Activity: Not on file  Stress: Not on file  Social Connections: Not on file  Intimate Partner Violence: Not on file    REVIEW OF SYSTEMS: Constitutional: Feels a little bit tired but nothing profound.  No weakness. ENT:  No nose bleeds Pulm: No shortness of breath or cough. CV:  No palpitations, no LE edema.  GU:  No hematuria, no frequency GI: See HPI.  Rare food related heartburn after spicy foods. Heme: Denies unusual or excessive bleeding GYN: Not using birth control as she and her partner are working on getting pregnant again.  No menorrhagia or irregular periods. Transfusions: No blood transfusions or treatment with blood products. Neuro:  No headaches, no peripheral tingling or numbness Derm:  No itching, no rash or sores.   Endocrine:  No sweats or chills.  No polyuria or dysuria Immunization: Not queried.    PHYSICAL EXAM: Vital signs in last 24 hours: Vitals:   06/30/20 0425 06/30/20 0828  BP: 125/66 (!) 100/47  Pulse: (!) 58 61  Resp: 16 18  Temp: 97.9 F (36.6 C) (!) 97.5 F (36.4 C)  SpO2: 100% 99%   Wt Readings from Last 3 Encounters:  06/26/20 (!) 140.2 kg  03/22/19 (!) 140.2 kg  03/19/19 (!) 138.3  kg    General: Very pleasant, well-appearing, comfortable obese young woman. Head: No facial asymmetry or swelling.  No signs of head trauma. Eyes: No scleral icterus.  No conjunctival pallor.  EOMI Ears: Not hard of hearing Nose: Congestion or discharge Mouth: Tongue midline.  Mucosa moist, pink, clear.  Good dentition. Neck: No JVD, masses, thyromegaly. Lungs: No labored breathing or cough.  Excellent clear breath sounds. Heart: RRR.  No MRG.  S1, S2 present Abdomen: Soft.  Minor tenderness in the upper right upper quadrant.  No guarding or rebound.  No HSM, masses, bruits, hernias appreciated..   Rectal: Deferred Musc/Skeltl: No joint redness, swelling or gross deformity Extremities: No CCE. Neurologic: Fully alert and oriented.  Provides excellent history.  Moves all 4 limbs.  No tremor or weakness. Skin: No jaundice, no suspicious rash, sores or lesions. Nodes: No cervical adenopathy Psych: Cooperative, pleasant, in good spirits.  Intake/Output from previous day: 04/25 0701 - 04/26 0700 In: 406.7 [I.V.:356.7; IV Piggyback:50] Out: -  Intake/Output this shift: No intake/output data recorded.  LAB RESULTS: Recent Labs    06/28/20 1900 06/29/20 1628 06/30/20 0412  WBC 8.7 8.3 9.0  HGB 13.4 13.6 12.2  HCT 40.3 41.9 37.0  PLT 256 252 228   BMET Lab Results  Component Value Date   NA 136 06/30/2020   NA 135 06/29/2020   NA 137 06/28/2020   K 4.0 06/30/2020   K 3.6 06/29/2020   K 4.0 06/28/2020   CL 106 06/30/2020   CL 99 06/29/2020   CL 105 06/28/2020   CO2 21 (L)  06/30/2020   CO2 25 06/29/2020   CO2 24 06/28/2020   GLUCOSE 101 (H) 06/30/2020   GLUCOSE 107 (H) 06/29/2020   GLUCOSE 99 06/28/2020   BUN 8 06/30/2020   BUN 8 06/29/2020   BUN 5 (L) 06/28/2020   CREATININE 0.66 06/30/2020   CREATININE 0.82 06/29/2020   CREATININE 0.67 06/28/2020   CALCIUM 8.5 (L) 06/30/2020   CALCIUM 8.8 (L) 06/29/2020   CALCIUM 9.0 06/28/2020   LFT Recent Labs    06/28/20 1900 06/29/20 1628 06/30/20 0412  PROT 7.0 7.0 6.1*  ALBUMIN 3.9 3.8 3.3*  AST 509* 292* 308*  ALT 845* 685* 631*  ALKPHOS 143* 152* 137*  BILITOT 3.4* 5.6* 4.7*   PT/INR No results found for: INR, PROTIME Hepatitis Panel Recent Labs    06/29/20 1628  HEPBSAG NON REACTIVE  HCVAB NON REACTIVE  HEPAIGM NON REACTIVE  HEPBIGM NON REACTIVE   C-Diff No components found for: CDIFF Lipase     Component Value Date/Time   LIPASE 42 06/29/2020 1628    Drugs of Abuse  No results found for: LABOPIA, COCAINSCRNUR, LABBENZ, AMPHETMU, THCU, LABBARB   RADIOLOGY STUDIES: MR ABDOMEN MRCP WO CONTRAST  Result Date: 06/29/2020 CLINICAL DATA:  Jaundice EXAM: MRI ABDOMEN WITHOUT CONTRAST  (INCLUDING MRCP) TECHNIQUE: Multiplanar multisequence MR imaging of the abdomen was performed. Heavily T2-weighted images of the biliary and pancreatic ducts were obtained, and three-dimensional MRCP images were rendered by post processing. COMPARISON:  Same day abdominal ultrasound. FINDINGS: Limited sequences obtained due to patient's inability to complete the study secondary to panic attack. Lower chest: No acute findings. Hepatobiliary: No hepatic steatosis. No suspicious hepatic lesion. Numerous small cholelithiasis in a distended gallbladder without wall thickening or pericholecystic fluid. There are multiple tiny round 2-3 mm filling defects within the common bile duct with extrahepatic and proximal intrahepatic biliary ductal dilation, with the common duct measuring 8 mm on  image 20/6. Pancreas: No mass,  inflammatory changes, or other parenchymal abnormality identified. Spleen:  Within normal limits in size and appearance. Adrenals/Urinary Tract: No masses identified. No evidence of hydronephrosis. Stomach/Bowel: Visualized portions within the abdomen are unremarkable. Vascular/Lymphatic: No pathologically enlarged lymph nodes identified. No abdominal aortic aneurysm demonstrated. Other:  None. Musculoskeletal: No suspicious bone lesions identified. IMPRESSION: Limited sequences obtained due to patient's inability to complete the study secondary to panic attack. Within this contracts: 1. Numerous tiny choledocholithiasis with mild extra and proximal intrahepatic biliary ductal dilation and the common duct measuring 8 mm. 2. Numerous small cholelithiasis in a distended gallbladder without wall thickening or pericholecystic fluid. Electronically Signed   By: Dahlia Bailiff MD   On: 06/29/2020 22:26   US Abdomen Limited RUQ (LIVER/GB)  Result Date: 06/29/2020 CLINICAL DATA:  Right upper quadrant pain for 5 days. EXAM: ULTRASOUND ABDOMEN LIMITED RIGHT UPPER QUADRANT COMPARISON:  None. FINDINGS: Gallbladder: Stone and sludge filled gallbladder. No wall thickening or pericholecystic fluid. The technologist describes tenderness with gallbladder palpation. Common bile duct: Diameter: Mildly dilated at 8 mm. Possible central intrahepatic biliary duct dilatation Liver: Mildly increased in echogenicity. Portal vein is patent on color Doppler imaging with normal direction of blood flow towards the liver. Other: No ascites. Mild degradation secondary to patient body habitus. IMPRESSION: 1. Cholelithiasis. Although there is no wall thickening or pericholecystic fluid, the technologist describes tenderness with gallbladder palpation . Acute cholecystitis cannot be excluded. 2. Mild common duct and possible intrahepatic duct dilatation. If the bilirubin is elevated, choledocholithiasis would be a concern and further evaluation  with MRCP or ERCP suggested. 3. Increased hepatic echogenicity, suggesting steatosis Electronically Signed   By: Abigail Miyamoto M.D.   On: 06/29/2020 19:27      IMPRESSION:   *    Choledocholithiasis.  *    cholelithiasis without evidence of cholecystitis  *     suggestion of hepatic steatosis by ultrasound.  *     morbid obesity with BMI 55.    PLAN:     *   ERCP  tomorrow.  I discussed the risks of bleeding, infection, inability to remove stones that might require biliary stent or IR intervention, mild to severe pancreatitis with the patient.  She is agreeable to proceed.  *    would have surgery see the patient re cholecystectomy.  *   Okay for clear liquids today.  *   Patient is not on DVT prophylaxis medication.  Should not start this for the time being.  Azucena Freed  06/30/2020, 10:47 AM Phone [phone]  GI ATTENDING  History, laboratories, x-rays reviewed.  Patient seen and examined.  Agree with comprehensive consultation note as outlined above.  The patient presents with symptomatic cholelithiasis and choledocholithiasis.  Imaging shows small common duct stones.  Plans for ERCP with sphincterotomy and stone extraction tomorrow.  The patient is high risk given her body habitus.  The nature of the procedure, as well as the risks, benefits, and alternatives were carefully and thoroughly reviewed with the patient. Ample time for discussion and questions allowed. The patient understood, was satisfied, and agreed to proceed.  Docia Chuck. Geri Seminole., M.D. [facility] Division of Gastroenterology

## 2020-06-30 NOTE — H&P (View-Only) (Signed)
[facility] Consult: 10:47 AM 06/30/2020  LOS: 1 day    Referring Provider: Dr Marlowe Sax  Primary Care Physician:  Pcp, No Primary Gastroenterologist:  unassigned     Reason for Consultation: Choledocholithiasis.   HPI: Connie Fisher is a 25 y.o. female.  Morbidly obese with BMI of 55.  Vaginal delivery of first pregnancy 03/25/2019. Lifelong background of constipation although she has fairly regular bowel movements she often strains to pass these.  Seen at  triage on 4/22 with 3 days of chest, abdominal pain, nausea, vomiting, constipation.  Epigastric pain worse after meals.  Emesis consisting of stomach contents but no coffee-ground or blood.  She was initially evaluated by the PA in the ED and had lab work but no imaging.  Ended up leaving the hospital before another provider returned because she had family matters to take care of. Return to the urgent care 2 days later, 2 days ago.  Persistence of same symptoms.  Had no relief with MiraLAX, Gas-X, Tums. Referred by urgent care to the ED when they discovered elevated LFTs. No fever.  No tachycardia.  Blood pressure soft, currently 100/47.  Excellent room air saturations.  Labs trending since her initial encounter on 4/22 through today as follows. Lipase 42.  T bili 7.1 >> 6.1.  Alk phos 6 >> 150 >> 37.  AST/ALT 241/251 >> 509/845 >> 308/631 WBCs, Hb, platelets normal. Abdominal ultrasound: Cholelithiasis, no gallbladder wall thickening or edema.  8 mm diameter CBD, mild dilation.  Suggestion of hepatic steatosis. MRCP: Tiny stones in CBD with mild extra and proximal intrahepatic ductal dilatation.  CBD 8 mm.  Numerous small gallbladder stones, GB distended but no wall thickening or pericholecystic fluid.  Her mother ended up having her gallbladder out soon  after giving birth to the patient probably around age 48.   Nephrolithiasis in mother, maternal aunt and maternal grandmother.    Past Medical History:  Diagnosis Date  . Heart murmur    AS A BABY  . Morbid obesity ([place])     Past Surgical History:  Procedure Laterality Date  . NO PAST SURGERIES      Prior to Admission medications   Medication Sig Start Date End Date Taking? Authorizing Provider  acetaminophen (TYLENOL) 325 MG tablet Take 2 tablets (650 mg total) by mouth every 4 (four) hours as needed (for pain scale < 4). 03/25/19   Clarnce Flock, MD  Blood Pressure Monitor KIT 1 kit by Does not apply route once a week. Check BP weekly.  Large Cuff. DX:  Z13.6         Z34.86 09/10/18   Anyanwu, Sallyanne Havers, MD  cyclobenzaprine (FLEXERIL) 5 MG tablet Take 1 tablet (5 mg total) by mouth 3 (three) times daily as needed for muscle spasms. 01/17/19   Jorje Guild, NP  Elastic Bandages & Supports (COMFORT FIT MATERNITY SUPP SM) MISC Wear as directed. 02/05/19   Shelly Bombard, MD  ferrous sulfate 325 (65 FE) MG tablet Take 1 tablet (325 mg total) by mouth every other day.  03/27/19   Clarnce Flock, MD  ibuprofen (ADVIL) 600 MG tablet Take 1 tablet (600 mg total) by mouth every 6 (six) hours. 03/25/19   Clarnce Flock, MD  norethindrone (ORTHO MICRONOR) 0.35 MG tablet Take 1 tablet (0.35 mg total) by mouth daily. 03/25/19 03/24/20  Clarnce Flock, MD  omeprazole (PRILOSEC) 20 MG capsule Take 1 capsule (20 mg total) by mouth daily. 01/17/19   Jorje Guild, NP  pantoprazole (PROTONIX) 40 MG tablet Take 1 tablet (40 mg total) by mouth daily. 06/28/20   Volney American, PA-C  Prenatal Vit w/Fe-Methylfol-FA (PNV PO) Take by mouth.    [provider]  sucralfate (CARAFATE) 1 g tablet Take 1 tablet (1 g total) by mouth 3 (three) times daily as needed. 06/28/20   Volney American, PA-C    Scheduled Meds:  Infusions: . sodium chloride 100 mL/hr at 06/30/20 0951    PRN Meds: acetaminophen **OR** acetaminophen, fentaNYL (SUBLIMAZE) injection, ondansetron **OR** ondansetron (ZOFRAN) IV   Allergies as of 06/29/2020  . (No Known Allergies)    Family History  Problem Relation Age of Onset  . Deep vein thrombosis Mother   . Kidney Stones Mother   . Diabetes Father   . Asthma Brother   . Arthritis Paternal Uncle   . Cancer Paternal Uncle   . Diabetes Paternal Grandmother     Social History   Socioeconomic History  . Marital status: Single    Spouse name: Not on file  . Number of children: Not on file  . Years of education: Not on file  . Highest education level: Not on file  Occupational History  . Occupation: UNEMPLOYED  Tobacco Use  . Smoking status: Never Smoker  . Smokeless tobacco: Never Used  Vaping Use  . Vaping Use: Never used  Substance and Sexual Activity  . Alcohol use: Not Currently    Comment: occ, events  . Drug use: Never  . Sexual activity: Yes  Other Topics Concern  . Not on file  Social History Narrative  . Not on file   Social Determinants of Health   Financial Resource Strain: Not on file  Food Insecurity: Not on file  Transportation Needs: Not on file  Physical Activity: Not on file  Stress: Not on file  Social Connections: Not on file  Intimate Partner Violence: Not on file    REVIEW OF SYSTEMS: Constitutional: Feels a little bit tired but nothing profound.  No weakness. ENT:  No nose bleeds Pulm: No shortness of breath or cough. CV:  No palpitations, no LE edema.  GU:  No hematuria, no frequency GI: See HPI.  Rare food related heartburn after spicy foods. Heme: Denies unusual or excessive bleeding GYN: Not using birth control as she and her partner are working on getting pregnant again.  No menorrhagia or irregular periods. Transfusions: No blood transfusions or treatment with blood products. Neuro:  No headaches, no peripheral tingling or numbness Derm:  No itching, no rash or sores.   Endocrine:  No sweats or chills.  No polyuria or dysuria Immunization: Not queried.    PHYSICAL EXAM: Vital signs in last 24 hours: Vitals:   06/30/20 0425 06/30/20 0828  BP: 125/66 (!) 100/47  Pulse: (!) 58 61  Resp: 16 18  Temp: 97.9 F (36.6 C) (!) 97.5 F (36.4 C)  SpO2: 100% 99%   Wt Readings from Last 3 Encounters:  06/26/20 (!) 140.2 kg  03/22/19 (!) 140.2 kg  03/19/19 (!) 138.3  kg    General: Very pleasant, well-appearing, comfortable obese young woman. Head: No facial asymmetry or swelling.  No signs of head trauma. Eyes: No scleral icterus.  No conjunctival pallor.  EOMI Ears: Not hard of hearing Nose: Congestion or discharge Mouth: Tongue midline.  Mucosa moist, pink, clear.  Good dentition. Neck: No JVD, masses, thyromegaly. Lungs: No labored breathing or cough.  Excellent clear breath sounds. Heart: RRR.  No MRG.  S1, S2 present Abdomen: Soft.  Minor tenderness in the upper right upper quadrant.  No guarding or rebound.  No HSM, masses, bruits, hernias appreciated..   Rectal: Deferred Musc/Skeltl: No joint redness, swelling or gross deformity Extremities: No CCE. Neurologic: Fully alert and oriented.  Provides excellent history.  Moves all 4 limbs.  No tremor or weakness. Skin: No jaundice, no suspicious rash, sores or lesions. Nodes: No cervical adenopathy Psych: Cooperative, pleasant, in good spirits.  Intake/Output from previous day: 04/25 0701 - 04/26 0700 In: 406.7 [I.V.:356.7; IV Piggyback:50] Out: -  Intake/Output this shift: No intake/output data recorded.  LAB RESULTS: Recent Labs    06/28/20 1900 06/29/20 1628 06/30/20 0412  WBC 8.7 8.3 9.0  HGB 13.4 13.6 12.2  HCT 40.3 41.9 37.0  PLT 256 252 228   BMET Lab Results  Component Value Date   NA 136 06/30/2020   NA 135 06/29/2020   NA 137 06/28/2020   K 4.0 06/30/2020   K 3.6 06/29/2020   K 4.0 06/28/2020   CL 106 06/30/2020   CL 99 06/29/2020   CL 105 06/28/2020   CO2 21 (L)  06/30/2020   CO2 25 06/29/2020   CO2 24 06/28/2020   GLUCOSE 101 (H) 06/30/2020   GLUCOSE 107 (H) 06/29/2020   GLUCOSE 99 06/28/2020   BUN 8 06/30/2020   BUN 8 06/29/2020   BUN 5 (L) 06/28/2020   CREATININE 0.66 06/30/2020   CREATININE 0.82 06/29/2020   CREATININE 0.67 06/28/2020   CALCIUM 8.5 (L) 06/30/2020   CALCIUM 8.8 (L) 06/29/2020   CALCIUM 9.0 06/28/2020   LFT Recent Labs    06/28/20 1900 06/29/20 1628 06/30/20 0412  PROT 7.0 7.0 6.1*  ALBUMIN 3.9 3.8 3.3*  AST 509* 292* 308*  ALT 845* 685* 631*  ALKPHOS 143* 152* 137*  BILITOT 3.4* 5.6* 4.7*   PT/INR No results found for: INR, PROTIME Hepatitis Panel Recent Labs    06/29/20 1628  HEPBSAG NON REACTIVE  HCVAB NON REACTIVE  HEPAIGM NON REACTIVE  HEPBIGM NON REACTIVE   C-Diff No components found for: CDIFF Lipase     Component Value Date/Time   LIPASE 42 06/29/2020 1628    Drugs of Abuse  No results found for: LABOPIA, COCAINSCRNUR, LABBENZ, AMPHETMU, THCU, LABBARB   RADIOLOGY STUDIES: MR ABDOMEN MRCP WO CONTRAST  Result Date: 06/29/2020 CLINICAL DATA:  Jaundice EXAM: MRI ABDOMEN WITHOUT CONTRAST  (INCLUDING MRCP) TECHNIQUE: Multiplanar multisequence MR imaging of the abdomen was performed. Heavily T2-weighted images of the biliary and pancreatic ducts were obtained, and three-dimensional MRCP images were rendered by post processing. COMPARISON:  Same day abdominal ultrasound. FINDINGS: Limited sequences obtained due to patient's inability to complete the study secondary to panic attack. Lower chest: No acute findings. Hepatobiliary: No hepatic steatosis. No suspicious hepatic lesion. Numerous small cholelithiasis in a distended gallbladder without wall thickening or pericholecystic fluid. There are multiple tiny round 2-3 mm filling defects within the common bile duct with extrahepatic and proximal intrahepatic biliary ductal dilation, with the common duct measuring 8 mm on  image 20/6. Pancreas: No mass,  inflammatory changes, or other parenchymal abnormality identified. Spleen:  Within normal limits in size and appearance. Adrenals/Urinary Tract: No masses identified. No evidence of hydronephrosis. Stomach/Bowel: Visualized portions within the abdomen are unremarkable. Vascular/Lymphatic: No pathologically enlarged lymph nodes identified. No abdominal aortic aneurysm demonstrated. Other:  None. Musculoskeletal: No suspicious bone lesions identified. IMPRESSION: Limited sequences obtained due to patient's inability to complete the study secondary to panic attack. Within this contracts: 1. Numerous tiny choledocholithiasis with mild extra and proximal intrahepatic biliary ductal dilation and the common duct measuring 8 mm. 2. Numerous small cholelithiasis in a distended gallbladder without wall thickening or pericholecystic fluid. Electronically Signed   By: Dahlia Bailiff MD   On: 06/29/2020 22:26   US Abdomen Limited RUQ (LIVER/GB)  Result Date: 06/29/2020 CLINICAL DATA:  Right upper quadrant pain for 5 days. EXAM: ULTRASOUND ABDOMEN LIMITED RIGHT UPPER QUADRANT COMPARISON:  None. FINDINGS: Gallbladder: Stone and sludge filled gallbladder. No wall thickening or pericholecystic fluid. The technologist describes tenderness with gallbladder palpation. Common bile duct: Diameter: Mildly dilated at 8 mm. Possible central intrahepatic biliary duct dilatation Liver: Mildly increased in echogenicity. Portal vein is patent on color Doppler imaging with normal direction of blood flow towards the liver. Other: No ascites. Mild degradation secondary to patient body habitus. IMPRESSION: 1. Cholelithiasis. Although there is no wall thickening or pericholecystic fluid, the technologist describes tenderness with gallbladder palpation . Acute cholecystitis cannot be excluded. 2. Mild common duct and possible intrahepatic duct dilatation. If the bilirubin is elevated, choledocholithiasis would be a concern and further evaluation  with MRCP or ERCP suggested. 3. Increased hepatic echogenicity, suggesting steatosis Electronically Signed   By: Abigail Miyamoto M.D.   On: 06/29/2020 19:27      IMPRESSION:   *    Choledocholithiasis.  *    cholelithiasis without evidence of cholecystitis  *     suggestion of hepatic steatosis by ultrasound.  *     morbid obesity with BMI 55.    PLAN:     *   ERCP  tomorrow.  I discussed the risks of bleeding, infection, inability to remove stones that might require biliary stent or IR intervention, mild to severe pancreatitis with the patient.  She is agreeable to proceed.  *    would have surgery see the patient re cholecystectomy.  *   Okay for clear liquids today.  *   Patient is not on DVT prophylaxis medication.  Should not start this for the time being.  Azucena Freed  06/30/2020, 10:47 AM Phone [phone]  GI ATTENDING  History, laboratories, x-rays reviewed.  Patient seen and examined.  Agree with comprehensive consultation note as outlined above.  The patient presents with symptomatic cholelithiasis and choledocholithiasis.  Imaging shows small common duct stones.  Plans for ERCP with sphincterotomy and stone extraction tomorrow.  The patient is high risk given her body habitus.  The nature of the procedure, as well as the risks, benefits, and alternatives were carefully and thoroughly reviewed with the patient. Ample time for discussion and questions allowed. The patient understood, was satisfied, and agreed to proceed.  Docia Chuck. Geri Seminole., M.D. [facility] of Gastroenterology

## 2020-06-30 NOTE — Plan of Care (Signed)
  Problem: Education: Goal: Knowledge of General Education information will improve Description: Including pain rating scale, medication(s)/side effects and non-pharmacologic comfort measures Outcome: Progressing   Problem: Health Behavior/Discharge Planning: Goal: Ability to manage health-related needs will improve Outcome: Progressing   Problem: Clinical Measurements: Goal: Ability to maintain clinical measurements within normal limits will improve Outcome: Progressing   Problem: Activity: Goal: Risk for activity intolerance will decrease Outcome: Progressing   Problem: Nutrition: Goal: Adequate nutrition will be maintained Outcome: Progressing   Problem: Coping: Goal: Level of anxiety will decrease Outcome: Progressing   Problem: Elimination: Goal: Will not experience complications related to urinary retention Outcome: Progressing   Problem: Pain Managment: Goal: General experience of comfort will improve Outcome: Progressing   Problem: Safety: Goal: Ability to remain free from injury will improve Outcome: Progressing   

## 2020-06-30 NOTE — Progress Notes (Signed)
[facility] Progress Note  Patient: Connie Fisher    TOI:[id number]  DOA: 06/29/2020     Date of Service: the patient was seen and examined on 06/30/2020  Brief hospital course: Past medical history of morbid obesity.  Presents with complaints of abdominal pain with nausea and vomiting.  Found to have biliary colic with choledocholithiasis. GI consulted. Currently plan is monitor response after ERCP scheduled on 4/27.  May require surgical consult thereafter.  Assessment and Plan: 1.  Abdominal pain from biliary colic. Choledocholithiasis and gallbladder stones. Elevated LFT, hyperbilirubinemia Presents with abdominal pain.  LFT in 100s. At peak AST 509, ALT 845.  Now trending down but stable.  Ultrasound liver shows evidence of gallstones with dilated CBD. No evidence of acute cholecystitis or cholangitis. MRCP performed shows CBD stone with obstruction. [facility] consulted, currently scheduled for ERCP on 4/27. May require surgical consult as well for lap chole.  2.  Hepatic steatosis Patient will benefit from outpatient consultation for weight loss management  3.  Morbid obesity Reported BMI 54.75. Placing the patient at high risk for poor outcome down the road. Likely cause of her gallstones as well. Patient will benefit from outpatient consultation for weight loss management given her hepatic steatosis finding as well  4.  History of GERD During her pregnancy.  Patient started having this abdominal pain symptoms intermittently. She was placed on PPI at that time. Currently not taking any medication.  Diet: N.p.o. DVT Prophylaxis:   SCDs Start: 06/30/20 0045    Advance goals of care discussion: Full code  Family Communication: family was present at bedside, at the time of interview.  The pt provided permission to discuss medical plan with the family. Opportunity was given to ask question and all questions were answered satisfactorily.   Disposition:  Status  is: Inpatient  Remains inpatient appropriate because:IV treatments appropriate due to intensity of illness or inability to take PO   Dispo: The patient is from: Home              Anticipated d/c is to: Home              Patient currently is not medically stable to d/c.   Difficult to place patient No        Subjective: No nausea or vomiting.  Currently pain resolved.  Passing gas.  No BM.  Physical Exam:  General: Appear in mild distress, no Rash; Oral Mucosa Clear, moist. no Abnormal Neck Mass Or lumps, Conjunctiva normal  Cardiovascular: S1 and S2 Present, no Murmur, Respiratory: good respiratory effort, Bilateral Air entry present and CTA, no Crackles, no wheezes Abdomen: Bowel Sound present, Soft and mild right upper quadrant tenderness Extremities: no Pedal edema Neurology: alert and oriented to time, place, and person affect appropriate. no new focal deficit Gait not checked due to patient safety concerns  Vitals:   06/30/20 0358 06/30/20 0425 06/30/20 0828 06/30/20 1500  BP: (!) 107/43 125/66 (!) 100/47 (!) 100/45  Pulse: 67 (!) 58 61 66  Resp: 16 16 18 17   Temp:  97.9 F (36.6 C) (!) 97.5 F (36.4 C) 98.6 F (37 C)  TempSrc:  Oral Oral Oral  SpO2: 99% 100% 99% 100%    Intake/Output Summary (Last 24 hours) at 06/30/2020 1819 Last data filed at 06/30/2020 1507 Gross per 24 hour  Intake 1895.05 ml  Output --  Net 1895.05 ml   There were no vitals filed for this visit.  Data Reviewed: I have personally reviewed  and interpreted daily labs, tele strips, imaging. I reviewed all nursing notes, pharmacy notes, vitals, pertinent old records I have discussed plan of care as described above with RN and patient/family.  CBC: Recent Labs  Lab 06/26/20 2101 06/28/20 1900 06/29/20 1628 06/30/20 0412  WBC 12.3* 8.7 8.3 9.0  NEUTROABS 9.5* 6.0  --   --   HGB 13.6 13.4 13.6 12.2  HCT 41.6 40.3 41.9 37.0  MCV 95.2 94.2 97.7 95.1  PLT 262 256 252 228   Basic  Metabolic Panel: Recent Labs  Lab 06/26/20 2101 06/28/20 1900 06/29/20 1628 06/30/20 0412  NA 137 137 135 136  K 3.6 4.0 3.6 4.0  CL 105 105 99 106  CO2 25 24 25  21*  GLUCOSE 103* 99 * 101*  BUN 7 5* 8 8  CREATININE 0.63 0.67 0.82 0.66  CALCIUM 8.9 9.0 8.8* 8.5*    Studies: MR ABDOMEN MRCP WO CONTRAST  Result Date: 06/29/2020 CLINICAL DATA:  Jaundice EXAM: MRI ABDOMEN WITHOUT CONTRAST  (INCLUDING MRCP) TECHNIQUE: Multiplanar multisequence MR imaging of the abdomen was performed. Heavily T2-weighted images of the biliary and pancreatic ducts were obtained, and three-dimensional MRCP images were rendered by post processing. COMPARISON:  Same day abdominal ultrasound. FINDINGS: Limited sequences obtained due to patient's inability to complete the study secondary to panic attack. Lower chest: No acute findings. Hepatobiliary: No hepatic steatosis. No suspicious hepatic lesion. Numerous small cholelithiasis in a distended gallbladder without wall thickening or pericholecystic fluid. There are multiple tiny round 2-3 mm filling defects within the common bile duct with extrahepatic and proximal intrahepatic biliary ductal dilation, with the common duct measuring 8 mm on image 20/6. Pancreas: No mass, inflammatory changes, or other parenchymal abnormality identified. Spleen:  Within normal limits in size and appearance. Adrenals/Urinary Tract: No masses identified. No evidence of hydronephrosis. Stomach/Bowel: Visualized portions within the abdomen are unremarkable. Vascular/Lymphatic: No pathologically enlarged lymph nodes identified. No abdominal aortic aneurysm demonstrated. Other:  None. Musculoskeletal: No suspicious bone lesions identified. IMPRESSION: Limited sequences obtained due to patient's inability to complete the study secondary to panic attack. Within this contracts: 1. Numerous tiny choledocholithiasis with mild extra and proximal intrahepatic biliary ductal dilation and the common  duct measuring 8 mm. 2. Numerous small cholelithiasis in a distended gallbladder without wall thickening or pericholecystic fluid. Electronically Signed   By: 07/01/2020 MD   On: 06/29/2020 22:26   07/01/2020 Abdomen Limited RUQ (LIVER/GB)  Result Date: 06/29/2020 CLINICAL DATA:  Right upper quadrant pain for 5 days. EXAM: ULTRASOUND ABDOMEN LIMITED RIGHT UPPER QUADRANT COMPARISON:  None. FINDINGS: Gallbladder: Stone and sludge filled gallbladder. No wall thickening or pericholecystic fluid. The technologist describes tenderness with gallbladder palpation. Common bile duct: Diameter: Mildly dilated at 8 mm. Possible central intrahepatic biliary duct dilatation Liver: Mildly increased in echogenicity. Portal vein is patent on color Doppler imaging with normal direction of blood flow towards the liver. Other: No ascites. Mild degradation secondary to patient body habitus. IMPRESSION: 1. Cholelithiasis. Although there is no wall thickening or pericholecystic fluid, the technologist describes tenderness with gallbladder palpation . Acute cholecystitis cannot be excluded. 2. Mild common duct and possible intrahepatic duct dilatation. If the bilirubin is elevated, choledocholithiasis would be a concern and further evaluation with MRCP or ERCP suggested. 3. Increased hepatic echogenicity, suggesting steatosis Electronically Signed   By: 07/01/2020 M.D.   On: 06/29/2020 19:27    Scheduled Meds: . [START ON 07/01/2020] indomethacin  100 mg Rectal To Endo   Continuous  Infusions: . [START ON 07/01/2020] ampicillin-sulbactam (UNASYN) 3 g IVPB (Mini-Bag Plus)     PRN Meds: acetaminophen **OR** acetaminophen, fentaNYL (SUBLIMAZE) injection, ondansetron **OR** ondansetron (ZOFRAN) IV  Time spent: 35 minutes  Author: Lynden Oxford, MD [facility] 06/30/2020 6:19 PM  To reach On-call, see care teams to locate the attending and reach out via [email or web address]. Between 7PM-7AM, please contact night-coverage If you  still have difficulty reaching the attending provider, please page the [facility] (Director on Call) for [facility] on amion for assistance.

## 2020-06-30 NOTE — ED Notes (Signed)
Report given to 5N nurse. 

## 2020-07-01 ENCOUNTER — Encounter (HOSPITAL_COMMUNITY): Payer: Self-pay | Admitting: Family Medicine

## 2020-07-01 ENCOUNTER — Encounter (HOSPITAL_COMMUNITY)
Admission: EM | Disposition: A | Discharge status: Home / Self Care | Payer: Self-pay | Source: Ambulatory Visit | Attending: Internal Medicine

## 2020-07-01 ENCOUNTER — Inpatient Hospital Stay (HOSPITAL_COMMUNITY): Payer: Medicaid Other

## 2020-07-01 ENCOUNTER — Inpatient Hospital Stay (HOSPITAL_COMMUNITY): Payer: Medicaid Other | Admitting: Certified Registered Nurse Anesthetist

## 2020-07-01 DIAGNOSIS — Z6841 Body Mass Index (BMI) 40.0 and over, adult: Secondary | ICD-10-CM | POA: Diagnosis not present

## 2020-07-01 DIAGNOSIS — K760 Fatty (change of) liver, not elsewhere classified: Secondary | ICD-10-CM | POA: Diagnosis not present

## 2020-07-01 DIAGNOSIS — K660 Peritoneal adhesions (postprocedural) (postinfection): Secondary | ICD-10-CM | POA: Diagnosis not present

## 2020-07-01 DIAGNOSIS — K838 Other specified diseases of biliary tract: Secondary | ICD-10-CM | POA: Diagnosis not present

## 2020-07-01 DIAGNOSIS — R7989 Other specified abnormal findings of blood chemistry: Secondary | ICD-10-CM | POA: Diagnosis not present

## 2020-07-01 DIAGNOSIS — K8070 Calculus of gallbladder and bile duct without cholecystitis without obstruction: Secondary | ICD-10-CM | POA: Diagnosis not present

## 2020-07-01 DIAGNOSIS — K5900 Constipation, unspecified: Secondary | ICD-10-CM | POA: Diagnosis not present

## 2020-07-01 DIAGNOSIS — K8050 Calculus of bile duct without cholangitis or cholecystitis without obstruction: Secondary | ICD-10-CM | POA: Diagnosis not present

## 2020-07-01 DIAGNOSIS — K8080 Other cholelithiasis without obstruction: Secondary | ICD-10-CM | POA: Diagnosis not present

## 2020-07-01 DIAGNOSIS — K219 Gastro-esophageal reflux disease without esophagitis: Secondary | ICD-10-CM | POA: Diagnosis not present

## 2020-07-01 DIAGNOSIS — R1011 Right upper quadrant pain: Secondary | ICD-10-CM | POA: Diagnosis not present

## 2020-07-01 DIAGNOSIS — Z8719 Personal history of other diseases of the digestive system: Secondary | ICD-10-CM | POA: Diagnosis not present

## 2020-07-01 DIAGNOSIS — Z833 Family history of diabetes mellitus: Secondary | ICD-10-CM | POA: Diagnosis not present

## 2020-07-01 DIAGNOSIS — Z825 Family history of asthma and other chronic lower respiratory diseases: Secondary | ICD-10-CM | POA: Diagnosis not present

## 2020-07-01 DIAGNOSIS — Z20822 Contact with and (suspected) exposure to covid-19: Secondary | ICD-10-CM | POA: Diagnosis not present

## 2020-07-01 HISTORY — PX: SPHINCTEROTOMY: SHX5544

## 2020-07-01 HISTORY — PX: ENDOSCOPIC RETROGRADE CHOLANGIOPANCREATOGRAPHY (ERCP) WITH PROPOFOL: SHX5810

## 2020-07-01 HISTORY — PX: REMOVAL OF STONES: SHX5545

## 2020-07-01 LAB — COMPREHENSIVE METABOLIC PANEL
ALT: 493 U/L — ABNORMAL HIGH (ref 0–44)
AST: 125 U/L — ABNORMAL HIGH (ref 15–41)
Albumin: 3.3 g/dL — ABNORMAL LOW (ref 3.5–5.0)
Alkaline Phosphatase: 134 U/L — ABNORMAL HIGH (ref 38–126)
Anion gap: 5 (ref 5–15)
BUN: <5 mg/dL — ABNORMAL LOW (ref 6–20)
CO2: 25 mmol/L (ref 22–32)
Calcium: 8.5 mg/dL — ABNORMAL LOW (ref 8.9–10.3)
Chloride: 109 mmol/L (ref 98–111)
Creatinine, Ser: 0.63 mg/dL (ref 0.44–1.00)
GFR, Estimated: >60 mL/min (ref >60)
Glucose, Bld: 92 mg/dL (ref 70–99)
Potassium: 4.1 mmol/L (ref 3.5–5.1)
Sodium: 139 mmol/L (ref 135–145)
Total Bilirubin: 1.7 mg/dL — ABNORMAL HIGH (ref 0.3–1.2)
Total Protein: 6 g/dL — ABNORMAL LOW (ref 6.5–8.1)

## 2020-07-01 LAB — CBC
HCT: 36.5 % (ref 36.0–46.0)
Hemoglobin: 11.9 g/dL — ABNORMAL LOW (ref 12.0–15.0)
MCH: 31.4 pg (ref 26.0–34.0)
MCHC: 32.6 g/dL (ref 30.0–36.0)
MCV: 96.3 fL (ref 80.0–100.0)
Platelets: 216 10*3/uL (ref 150–400)
RBC: 3.79 MIL/uL — ABNORMAL LOW (ref 3.87–5.11)
RDW: 12.9 % (ref 11.5–15.5)
WBC: 8.3 10*3/uL (ref 4.0–10.5)
nRBC: 0 % (ref 0.0–0.2)

## 2020-07-01 SURGERY — ENDOSCOPIC RETROGRADE CHOLANGIOPANCREATOGRAPHY (ERCP) WITH PROPOFOL
Anesthesia: General

## 2020-07-01 MED ORDER — OXYCODONE HCL 5 MG PO TABS: 5.0000 mg | ORAL_TABLET | Freq: Once | ORAL | Status: DC | PRN

## 2020-07-01 MED ORDER — PROMETHAZINE HCL 25 MG/ML IJ SOLN
INTRAMUSCULAR | Status: AC
  Filled 2020-07-01: qty 1

## 2020-07-01 MED ORDER — ONDANSETRON HCL 4 MG/2ML IJ SOLN
INTRAMUSCULAR | Status: DC | PRN
  Administered 2020-07-01: 4 mg via INTRAVENOUS

## 2020-07-01 MED ORDER — MEPERIDINE HCL 100 MG/ML IJ SOLN: 6.2500 mg | INTRAMUSCULAR | Status: DC | PRN

## 2020-07-01 MED ORDER — DEXAMETHASONE SODIUM PHOSPHATE 10 MG/ML IJ SOLN
INTRAMUSCULAR | Status: DC | PRN
  Administered 2020-07-01: 10 mg via INTRAVENOUS

## 2020-07-01 MED ORDER — GLUCAGON HCL RDNA (DIAGNOSTIC) 1 MG IJ SOLR
INTRAMUSCULAR | Status: AC
  Filled 2020-07-01: qty 1

## 2020-07-01 MED ORDER — KETOROLAC TROMETHAMINE 30 MG/ML IJ SOLN
30.0000 mg | Freq: Once | INTRAMUSCULAR | Status: DC | PRN
  Filled 2020-07-01: qty 1

## 2020-07-01 MED ORDER — PHENYLEPHRINE HCL (PRESSORS) 10 MG/ML IV SOLN
INTRAVENOUS | Status: DC | PRN
  Administered 2020-07-01 (×7): 80 ug via INTRAVENOUS

## 2020-07-01 MED ORDER — INDOMETHACIN 50 MG RE SUPP
RECTAL | Status: DC | PRN
  Administered 2020-07-01: 100 mg via RECTAL

## 2020-07-01 MED ORDER — MIDAZOLAM HCL 2 MG/2ML IJ SOLN
INTRAMUSCULAR | Status: DC | PRN
  Administered 2020-07-01: 2.5 mg via INTRAVENOUS

## 2020-07-01 MED ORDER — PROPOFOL 10 MG/ML IV BOLUS
INTRAVENOUS | Status: DC | PRN
  Administered 2020-07-01: 200 mg via INTRAVENOUS

## 2020-07-01 MED ORDER — CIPROFLOXACIN IN D5W 400 MG/200ML IV SOLN
INTRAVENOUS | Status: AC
  Filled 2020-07-01: qty 200

## 2020-07-01 MED ORDER — OXYCODONE HCL 5 MG/5ML PO SOLN: 5.0000 mg | Freq: Once | ORAL | Status: DC | PRN

## 2020-07-01 MED ORDER — PROMETHAZINE HCL 25 MG/ML IJ SOLN
6.2500 mg | INTRAMUSCULAR | Status: DC | PRN
  Administered 2020-07-01: 12.5 mg via INTRAVENOUS

## 2020-07-01 MED ORDER — HYDROMORPHONE HCL 1 MG/ML IJ SOLN: 0.2500 mg | INTRAMUSCULAR | Status: DC | PRN

## 2020-07-01 MED ORDER — FENTANYL CITRATE (PF) 250 MCG/5ML IJ SOLN
INTRAMUSCULAR | Status: DC | PRN
  Administered 2020-07-01: 100 ug via INTRAVENOUS

## 2020-07-01 MED ORDER — SODIUM CHLORIDE 0.9 % IV SOLN
INTRAVENOUS | Status: AC
  Filled 2020-07-01: qty 8

## 2020-07-01 MED ORDER — LIDOCAINE 2% (20 MG/ML) 5 ML SYRINGE
INTRAMUSCULAR | Status: DC | PRN
  Administered 2020-07-01: 60 mg via INTRAVENOUS

## 2020-07-01 MED ORDER — ROCURONIUM BROMIDE 10 MG/ML (PF) SYRINGE
PREFILLED_SYRINGE | INTRAVENOUS | Status: DC | PRN
  Administered 2020-07-01: 100 mg via INTRAVENOUS

## 2020-07-01 MED ORDER — INDOMETHACIN 50 MG RE SUPP
RECTAL | Status: AC
  Filled 2020-07-01: qty 2

## 2020-07-01 MED ORDER — LACTATED RINGERS IV SOLN: INTRAVENOUS | Status: DC | PRN

## 2020-07-01 MED ORDER — FENTANYL CITRATE (PF) 100 MCG/2ML IJ SOLN
INTRAMUSCULAR | Status: AC
  Filled 2020-07-01: qty 2

## 2020-07-01 MED ORDER — SUGAMMADEX SODIUM 200 MG/2ML IV SOLN
INTRAVENOUS | Status: DC | PRN
  Administered 2020-07-01: 300 mg via INTRAVENOUS

## 2020-07-01 MED ORDER — MIDAZOLAM HCL (PF) 5 MG/ML IJ SOLN
INTRAMUSCULAR | Status: AC
  Filled 2020-07-01: qty 1

## 2020-07-01 MED ORDER — SODIUM CHLORIDE 0.9 % IV SOLN
INTRAVENOUS | Status: DC | PRN
  Administered 2020-07-01: 75 mL

## 2020-07-01 NOTE — Plan of Care (Signed)

## 2020-07-01 NOTE — Op Note (Signed)
[facility] Patient Name: Connie NewJylene Heggs Procedure Date : 07/01/2020 MRN: [id number] Attending MD: Wilhemina BonitoJohn N. Marina GoodellPerry , MD Date of Birth: 1996-02-13 CSN: [id number] Age: [age] Admit Type: Inpatient Procedure:                ERCP with sphincterotomy and common duct stone                            extraction Indications:              Bile duct stone on MRCP, elevated liver test,                            abdominal pain Providers:                Wilhemina BonitoJohn N. Marina GoodellPerry, MD, Roselie AwkwardShannon Love, RN, Michele McalpineErik Holloway                            Technician, Everlene Ballshristine Hayes, CRNA Referring MD:             [facility] Medicines:                General Anesthesia Complications:            No immediate complications. Estimated Blood Loss:     Estimated blood loss: none. Procedure:                Pre-Anesthesia Assessment:                           - Prior to the procedure, a History and Physical                            was performed, and patient medications and                            allergies were reviewed. The patient is competent.                            The risks and benefits of the procedure and the                            sedation options and risks were discussed with the                            patient. All questions were answered and informed                            consent was obtained. Patient identification and                            proposed procedure were verified by the physician.                            Mental Status Examination: alert and oriented.  Airway Examination: normal oropharyngeal airway and                            neck mobility. Respiratory Examination: clear to                            auscultation. CV Examination: normal. Prophylactic                            Antibiotics: The patient does not require                            prophylactic antibiotics. Prior Anticoagulants: The                            patient  has taken no previous anticoagulant or                            antiplatelet agents. ASA Grade Assessment: II - A                            patient with mild systemic disease. After reviewing                            the risks and benefits, the patient was deemed in                            satisfactory condition to undergo the procedure.                            The anesthesia plan was to use moderate sedation /                            analgesia (conscious sedation). Immediately prior                            to administration of medications, the patient was                            re-assessed for adequacy to receive sedatives. The                            heart rate, respiratory rate, oxygen saturations,                            blood pressure, adequacy of pulmonary ventilation,                            and response to care were monitored throughout the                            procedure. The physical status of the patient was  re-assessed after the procedure.                           After obtaining informed consent, the scope was                            passed under direct vision. Throughout the                            procedure, the patient's blood pressure, pulse, and                            oxygen saturations were monitored continuously. The                            [id number]-Q180V ([id number]) Olympus Duodenoscope was                            introduced through the mouth, and used to inject                            contrast into and used to inject contrast into the                            bile duct and ventral pancreatic duct. The ERCP was                            accomplished without unusual difficulty. The                            patient tolerated the procedure well. Scope In: Scope Out: Findings:      1. ENDOSCOPIC EXAMINATION:      The esophagus was blindly intubated. The stomach, duodenum, and major        ampulla were normal. The minor ampulla was not sought      1. X-ray FINDINGS:      ?" A scout radiograph of the abdomen with the endoscope in position was       unremarkable      ?" Initial injection of contrast yielded a normal pancreatogram      ?" Adjustment of the cannula yielded filling of the common bile duct.       There was difficulty advancing the wire into the bile duct. Thus, a       guidewire was placed gently into the pancreatic duct (see image).       Subsequently, the common bile duct was accessed with a guidewire.       Complete filling of the bile duct did not reveal obvious filling       defects. There was some difficulty with grainy images given the       patient's body habitus. Given the MRCP findings, it was elected to       perform a biliary sphincterotomy. This was performed using the ERBE       system cutting in the 12:00 orientation.      ?" Complete filling of the biliary tree revealed mild biliary dilation (9       mm  CBD). There was filling of the cystic duct and gallbladder.      ?"A sequential 9, 12 mm balloon was used to sweep the bile duct several       times. Minuscule stone fragments were extracted. No other abnormalities.       Excellent drainage of the bile duct post balloon extraction. Impression:               1. Choledocholithiasis status post ERCP with                            sphincterotomy and stone extraction                           2. Cholelithiasis. Recommendation:           1. Standard post ERCP observation and care                            including indomethacin suppositories                           2. Clear liquid diet                           3. Laparoscopic cholecystectomy per general surgery                           . Discussed with patient in the PACU. She was                            provided a copy of her report. Procedure Code(s):        --- Professional ---                           [phone], Endoscopic retrograde                             cholangiopancreatography (ERCP); with removal of                            calculi/debris from biliary/pancreatic duct(s)                           43262, Endoscopic retrograde                            cholangiopancreatography (ERCP); with                            sphincterotomy/papillotomy Diagnosis Code(s):        --- Professional ---                           K80.50, Calculus of bile duct without cholangitis                            or cholecystitis without obstruction  K83.8, Other specified diseases of biliary tract CPT copyright 2019 [employer]. All rights reserved. The codes documented in this report are preliminary and upon coder review may  be revised to meet current compliance requirements. Wilhemina Bonito. Marina Goodell, MD 07/01/2020 5:10:40 PM This report has been signed electronically. Number of Addenda: 0

## 2020-07-01 NOTE — Anesthesia Procedure Notes (Signed)
Procedure Name: Intubation Date/Time: 07/01/2020 3:13 PM Performed by: Betha Loa, CRNA Pre-anesthesia Checklist: Patient identified, Emergency Drugs available, Suction available and Patient being monitored Patient Re-evaluated:Patient Re-evaluated prior to induction Oxygen Delivery Method: Circle System Utilized Preoxygenation: Pre-oxygenation with 100% oxygen Induction Type: IV induction Ventilation: Mask ventilation without difficulty Laryngoscope Size: Mac and 3 Grade View: Grade I Tube type: Oral Tube size: 7.5 mm Number of attempts: 1 Airway Equipment and Method: Stylet and Oral airway Placement Confirmation: ETT inserted through vocal cords under direct vision,  positive ETCO2 and breath sounds checked- equal and bilateral Secured at: 22 cm Tube secured with: Tape Dental Injury: Teeth and Oropharynx as per pre-operative assessment

## 2020-07-01 NOTE — Transfer of Care (Signed)
Immediate Anesthesia Transfer of Care Note  Patient: Connie Fisher  Procedure(s) Performed: ENDOSCOPIC RETROGRADE CHOLANGIOPANCREATOGRAPHY (ERCP) WITH PROPOFOL (N/A ) SPHINCTEROTOMY REMOVAL OF STONES  Patient Location: PACU  Anesthesia Type:General  Level of Consciousness: awake, alert  and oriented  Airway & Oxygen Therapy: Patient Spontanous Breathing  Post-op Assessment: Report given to RN and Post -op Vital signs reviewed and stable  Post vital signs: Reviewed and stable  Last Vitals:  Vitals Value Taken Time  BP 114/60 07/01/20 1654  Temp    Pulse 86 07/01/20 1654  Resp 16 07/01/20 1654  SpO2 100 % 07/01/20 1654  Vitals shown include unvalidated device data.  Last Pain:  Vitals:   07/01/20 1413  TempSrc: Oral  PainSc: 4          Complications: No complications documented.

## 2020-07-01 NOTE — Interval H&P Note (Signed)
History and Physical Interval Note:  07/01/2020 2:57 PM  Connie Fisher  has presented today for surgery, with the diagnosis of Choledocholithiasis.  The various methods of treatment have been discussed with the patient and family. After consideration of risks, benefits and other options for treatment, the patient has consented to  Procedure(s): ENDOSCOPIC RETROGRADE CHOLANGIOPANCREATOGRAPHY (ERCP) WITH PROPOFOL (N/A) as a surgical intervention.  The patient's history has been reviewed, patient examined, no change in status, stable for surgery.  I have reviewed the patient's chart and labs.  Questions were answered to the patient's satisfaction.    Patient seen preprocedure.  Clinical problem, indications, and the procedure again reviewed.  She looks well.  All questions answered.   Yancey Flemings

## 2020-07-01 NOTE — Anesthesia Postprocedure Evaluation (Signed)
Anesthesia Post Note  Patient: Connie Fisher  Procedure(s) Performed: ENDOSCOPIC RETROGRADE CHOLANGIOPANCREATOGRAPHY (ERCP) WITH PROPOFOL (N/A ) SPHINCTEROTOMY REMOVAL OF STONES     Patient location during evaluation: PACU Anesthesia Type: General Level of consciousness: awake and alert Pain management: pain level controlled Vital Signs Assessment: post-procedure vital signs reviewed and stable Respiratory status: spontaneous breathing, nonlabored ventilation, respiratory function stable and patient connected to nasal cannula oxygen Cardiovascular status: blood pressure returned to baseline and stable Postop Assessment: no apparent nausea or vomiting Anesthetic complications: no   No complications documented.  Last Vitals:  Vitals:   07/01/20 1709 07/01/20 1724  BP: (!) 100/44 (!) 115/56  Pulse: 63 (!) 56  Resp: 16 (!) 21  Temp:  36.7 C  SpO2: 100% 100%    Last Pain:  Vitals:   07/01/20 1724  TempSrc:   PainSc: 0-No pain                 Shelton Silvas

## 2020-07-01 NOTE — Progress Notes (Signed)
PROGRESS NOTE    Connie Fisher  XIH:[id number] DOB: 1996-02-26 DOA: 06/29/2020 PCP: Pcp, No    Brief Narrative:  Past medical history of morbid obesity.  Presents with complaints of abdominal pain with nausea and vomiting.  Found to have biliary colic with choledocholithiasis. GI consulted. 4/27-ERCP plan for today    Consultants:   GI  Procedures:   Antimicrobials:       Subjective: Mild rUQ pain. No n/v  Objective: Vitals:   06/30/20 0828 06/30/20 1500 07/01/20 0526 07/01/20 0753  BP: (!) 100/47 (!) 100/45 (!) 99/56 115/62  Pulse: 61 66 85 75  Resp: 18 17 17 16   Temp: (!) 97.5 F (36.4 C) 98.6 F (37 C) 98.6 F (37 C) 98.4 F (36.9 C)  TempSrc: Oral Oral Oral Oral  SpO2: 99% 100% 98% 100%    Intake/Output Summary (Last 24 hours) at 07/01/2020 0841 Last data filed at 06/30/2020 1507 Gross per 24 hour  Intake 1488.38 ml  Output --  Net 1488.38 ml   There were no vitals filed for this visit.  Examination:  General exam: Appears calm and comfortable  Respiratory system: Clear to auscultation. Respiratory effort normal. Cardiovascular system: S1 & S2 heard, RRR. No JVD, murmurs, rubs, gallops or clicks.  Gastrointestinal system: Abdomen is nondistended, soft and nontender. Normal bowel sounds heard. Central nervous system: Alert and oriented. No focal neurological deficits. Extremities: No edema Skin: Warm dry Psychiatry: Judgement and insight appear normal. Mood & affect appropriate.     Data Reviewed: I have personally reviewed following labs and imaging studies  CBC: Recent Labs  Lab 06/26/20 2101 06/28/20 1900 06/29/20 1628 06/30/20 0412 07/01/20 0300  WBC 12.3* 8.7 8.3 9.0 8.3  NEUTROABS 9.5* 6.0  --   --   --   HGB 13.6 13.4 13.6 12.2 11.9*  HCT 41.6 40.3 41.9 37.0 36.5  MCV 95.2 94.2 97.7 95.1 96.3  PLT 262 256 252 228 216   Basic Metabolic Panel: Recent Labs  Lab 06/26/20 2101 06/28/20 1900 06/29/20 1628 06/30/20 0412  07/01/20 0300  NA 137 137 135 136 139  K 3.6 4.0 3.6 4.0 4.1  CL 105 105 99 106 109  CO2 25 24 25  21* 25  GLUCOSE 103* 99 107* 101* 92  BUN 7 5* 8 8 <5*  CREATININE 0.63 0.67 0.82 0.66 0.63  CALCIUM 8.9 9.0 8.8* 8.5* 8.5*   GFR: Estimated Creatinine Clearance: 148.5 mL/min (by C-G formula based on SCr of 0.63 mg/dL). Liver Function Tests: Recent Labs  Lab 06/26/20 2101 06/28/20 1900 06/29/20 1628 06/30/20 0412 07/01/20 0300  AST 241* 509* 292* 308* 125*  ALT 251* 845* 685* 631* 493*  ALKPHOS 86 143* 152* 137* 134*  BILITOT 0.9 3.4* 5.6* 4.7* 1.7*  PROT 7.1 7.0 7.0 6.1* 6.0*  ALBUMIN 4.0 3.9 3.8 3.3* 3.3*   Recent Labs  Lab 06/26/20 2101 06/28/20 1900 06/29/20 1628  LIPASE 34 31 42   No results for input(s): AMMONIA in the last 168 hours. Coagulation Profile: No results for input(s): INR, PROTIME in the last 168 hours. Cardiac Enzymes: No results for input(s): CKTOTAL, CKMB, CKMBINDEX, TROPONINI in the last 168 hours. BNP (last 3 results) No results for input(s): PROBNP in the last 8760 hours. HbA1C: No results for input(s): HGBA1C in the last 72 hours. CBG: No results for input(s): GLUCAP in the last 168 hours. Lipid Profile: No results for input(s): CHOL, HDL, LDLCALC, TRIG, CHOLHDL, LDLDIRECT in the last 72 hours. Thyroid Function Tests: No  results for input(s): TSH, T4TOTAL, FREET4, T3FREE, THYROIDAB in the last 72 hours. Anemia Panel: No results for input(s): VITAMINB12, FOLATE, FERRITIN, TIBC, IRON, RETICCTPCT in the last 72 hours. Sepsis Labs: No results for input(s): PROCALCITON, LATICACIDVEN in the last 168 hours.  Recent Results (from the past 240 hour(s))  Resp Panel by RT-PCR (Flu A&B, Covid) Nasopharyngeal Swab     Status: None   Collection Time: 06/30/20  2:03 AM   Specimen: Nasopharyngeal Swab; Nasopharyngeal(NP) swabs in vial transport medium  Result Value Ref Range Status   SARS Coronavirus 2 by RT PCR NEGATIVE NEGATIVE Final    Comment:  (NOTE) SARS-CoV-2 target nucleic acids are NOT DETECTED.  The SARS-CoV-2 RNA is generally detectable in upper respiratory specimens during the acute phase of infection. The lowest concentration of SARS-CoV-2 viral copies this assay can detect is 138 copies/mL. A negative result does not preclude SARS-Cov-2 infection and should not be used as the sole basis for treatment or other patient management decisions. A negative result may occur with  improper specimen collection/handling, submission of specimen other than nasopharyngeal swab, presence of viral mutation(s) within the areas targeted by this assay, and inadequate number of viral copies(<138 copies/mL). A negative result must be combined with clinical observations, patient history, and epidemiological information. The expected result is Negative.  Fact Sheet for Patients:  BloggerCourse.com  Fact Sheet for Healthcare Providers:  SeriousBroker.it  This test is no t yet approved or cleared by the [place] FDA and  has been authorized for detection and/or diagnosis of SARS-CoV-2 by FDA under an Emergency Use Authorization (EUA). This EUA will remain  in effect (meaning this test can be used) for the duration of the COVID-19 declaration under Section 564(b)(1) of the Act, 21 U.S.C.section 360bbb-3(b)(1), unless the authorization is terminated  or revoked sooner.       Influenza A by PCR NEGATIVE NEGATIVE Final   Influenza B by PCR NEGATIVE NEGATIVE Final    Comment: (NOTE) The Xpert Xpress SARS-CoV-2/FLU/RSV plus assay is intended as an aid in the diagnosis of influenza from Nasopharyngeal swab specimens and should not be used as a sole basis for treatment. Nasal washings and aspirates are unacceptable for Xpert Xpress SARS-CoV-2/FLU/RSV testing.  Fact Sheet for Patients: BloggerCourse.com  Fact Sheet for Healthcare  Providers: SeriousBroker.it  This test is not yet approved or cleared by the [place] FDA and has been authorized for detection and/or diagnosis of SARS-CoV-2 by FDA under an Emergency Use Authorization (EUA). This EUA will remain in effect (meaning this test can be used) for the duration of the COVID-19 declaration under Section 564(b)(1) of the Act, 21 U.S.C. section 360bbb-3(b)(1), unless the authorization is terminated or revoked.  Performed at [facility], [address] [place], [place] [address]          Radiology Studies: MR ABDOMEN MRCP WO CONTRAST  Result Date: 06/29/2020 CLINICAL DATA:  Jaundice EXAM: MRI ABDOMEN WITHOUT CONTRAST  (INCLUDING MRCP) TECHNIQUE: Multiplanar multisequence MR imaging of the abdomen was performed. Heavily T2-weighted images of the biliary and pancreatic ducts were obtained, and three-dimensional MRCP images were rendered by post processing. COMPARISON:  Same day abdominal ultrasound. FINDINGS: Limited sequences obtained due to patient's inability to complete the study secondary to panic attack. Lower chest: No acute findings. Hepatobiliary: No hepatic steatosis. No suspicious hepatic lesion. Numerous small cholelithiasis in a distended gallbladder without wall thickening or pericholecystic fluid. There are multiple tiny round 2-3 mm filling defects within the common bile  duct with extrahepatic and proximal intrahepatic biliary ductal dilation, with the common duct measuring 8 mm on image 20/6. Pancreas: No mass, inflammatory changes, or other parenchymal abnormality identified. Spleen:  Within normal limits in size and appearance. Adrenals/Urinary Tract: No masses identified. No evidence of hydronephrosis. Stomach/Bowel: Visualized portions within the abdomen are unremarkable. Vascular/Lymphatic: No pathologically enlarged lymph nodes identified. No abdominal aortic aneurysm demonstrated. Other:  None. Musculoskeletal:  No suspicious bone lesions identified. IMPRESSION: Limited sequences obtained due to patient's inability to complete the study secondary to panic attack. Within this contracts: 1. Numerous tiny choledocholithiasis with mild extra and proximal intrahepatic biliary ductal dilation and the common duct measuring 8 mm. 2. Numerous small cholelithiasis in a distended gallbladder without wall thickening or pericholecystic fluid. Electronically Signed   By: Maudry Mayhew MD   On: 06/29/2020 22:26   US Abdomen Limited RUQ (LIVER/GB)  Result Date: 06/29/2020 CLINICAL DATA:  Right upper quadrant pain for 5 days. EXAM: ULTRASOUND ABDOMEN LIMITED RIGHT UPPER QUADRANT COMPARISON:  None. FINDINGS: Gallbladder: Stone and sludge filled gallbladder. No wall thickening or pericholecystic fluid. The technologist describes tenderness with gallbladder palpation. Common bile duct: Diameter: Mildly dilated at 8 mm. Possible central intrahepatic biliary duct dilatation Liver: Mildly increased in echogenicity. Portal vein is patent on color Doppler imaging with normal direction of blood flow towards the liver. Other: No ascites. Mild degradation secondary to patient body habitus. IMPRESSION: 1. Cholelithiasis. Although there is no wall thickening or pericholecystic fluid, the technologist describes tenderness with gallbladder palpation . Acute cholecystitis cannot be excluded. 2. Mild common duct and possible intrahepatic duct dilatation. If the bilirubin is elevated, choledocholithiasis would be a concern and further evaluation with MRCP or ERCP suggested. 3. Increased hepatic echogenicity, suggesting steatosis Electronically Signed   By: Jeronimo Greaves M.D.   On: 06/29/2020 19:27        Scheduled Meds: . indomethacin  100 mg Rectal To Endo   Continuous Infusions: . ampicillin-sulbactam (UNASYN) 3 g IVPB (Mini-Bag Plus)      Assessment & Plan:   Principal Problem:   Choledocholithiasis   1.  Abdominal pain from  biliary colic. Choledocholithiasis and gallbladder stones. Elevated LFT, hyperbilirubinemia Presents with abdominal pain.  LFT in 100s. At peak AST 509, ALT 845.  Now trending down but stable.  Ultrasound liver shows evidence of gallstones with dilated CBD. No evidence of acute cholecystitis or cholangitis. MRCP performed shows CBD stone with obstruction. 4/27-ERCP today May require surgical consult for lap chole.   2.  Hepatic steatosis May benefit from weight loss.    3.  Morbid obesity Reported BMI 54.75. Complicates overall health  4.  History of GERD Was on PPI, not taking it now   DVT prophylaxis: scd Code Status:full Family Communication: None at bedside  Status is: Inpatient  Remains inpatient appropriate because:Inpatient level of care appropriate due to severity of illness   Dispo: The patient is from: Home              Anticipated d/c is to: Home              Patient currently is not medically stable to d/c.   Difficult to place patient No            LOS: 2 days   Time spent: 35 minutes with more than 50% on COC's    Lynn Ito, MD [facility] Pager [phone]  If 7PM-7AM, please contact night-coverage 07/01/2020, 8:41 AM

## 2020-07-01 NOTE — Progress Notes (Signed)
Pt arrived back to the unit. Alert and oriented X4. No complaints of nausea at this time. Vitals stable. No complaints of pain.

## 2020-07-01 NOTE — Anesthesia Preprocedure Evaluation (Addendum)
Anesthesia Evaluation  Patient identified by MRN, date of birth, ID band Patient awake    Reviewed: Allergy & Precautions, NPO status , Patient's Chart, lab work & pertinent test results  Airway Mallampati: II  TM Distance: >3 FB Neck ROM: Full    Dental  (+) Poor Dentition, Missing, Chipped, Dental Advisory Given,    Pulmonary neg pulmonary ROS,    Pulmonary exam normal breath sounds clear to auscultation       Cardiovascular negative cardio ROS Normal cardiovascular exam Rhythm:Regular Rate:Normal     Neuro/Psych negative neurological ROS  negative psych ROS   GI/Hepatic GERD  Medicated and Controlled,Choledocholithiasis    Endo/Other  Morbid obesityBMI 55  Renal/GU negative Renal ROS  negative genitourinary   Musculoskeletal negative musculoskeletal ROS (+)   Abdominal (+) + obese,   Peds negative pediatric ROS (+)  Hematology negative hematology ROS (+)   Anesthesia Other Findings   Reproductive/Obstetrics negative OB ROS                            Anesthesia Physical Anesthesia Plan  ASA: III  Anesthesia Plan: General   Post-op Pain Management:    Induction: Intravenous  PONV Risk Score and Plan: 4 or greater and Ondansetron, Dexamethasone, Midazolam and Treatment may vary due to age or medical condition  Airway Management Planned: Oral ETT  Additional Equipment: None  Intra-op Plan:   Post-operative Plan: Extubation in OR  Informed Consent: I have reviewed the patients History and Physical, chart, labs and discussed the procedure including the risks, benefits and alternatives for the proposed anesthesia with the patient or authorized representative who has indicated his/her understanding and acceptance.     Dental advisory given  Plan Discussed with: CRNA  Anesthesia Plan Comments:         Anesthesia Quick Evaluation

## 2020-07-02 ENCOUNTER — Inpatient Hospital Stay (HOSPITAL_COMMUNITY): Payer: Medicaid Other | Admitting: Certified Registered Nurse Anesthetist

## 2020-07-02 ENCOUNTER — Encounter (HOSPITAL_COMMUNITY): Payer: Self-pay | Admitting: Family Medicine

## 2020-07-02 ENCOUNTER — Encounter (HOSPITAL_COMMUNITY)
Admission: EM | Disposition: A | Discharge status: Home / Self Care | Payer: Self-pay | Source: Ambulatory Visit | Attending: Internal Medicine

## 2020-07-02 DIAGNOSIS — K760 Fatty (change of) liver, not elsewhere classified: Secondary | ICD-10-CM | POA: Diagnosis not present

## 2020-07-02 DIAGNOSIS — Z6841 Body Mass Index (BMI) 40.0 and over, adult: Secondary | ICD-10-CM | POA: Diagnosis not present

## 2020-07-02 DIAGNOSIS — K8070 Calculus of gallbladder and bile duct without cholecystitis without obstruction: Secondary | ICD-10-CM | POA: Diagnosis not present

## 2020-07-02 DIAGNOSIS — G8918 Other acute postprocedural pain: Secondary | ICD-10-CM | POA: Diagnosis not present

## 2020-07-02 DIAGNOSIS — Z8719 Personal history of other diseases of the digestive system: Secondary | ICD-10-CM | POA: Diagnosis not present

## 2020-07-02 DIAGNOSIS — Z833 Family history of diabetes mellitus: Secondary | ICD-10-CM | POA: Diagnosis not present

## 2020-07-02 DIAGNOSIS — K660 Peritoneal adhesions (postprocedural) (postinfection): Secondary | ICD-10-CM | POA: Diagnosis not present

## 2020-07-02 DIAGNOSIS — K838 Other specified diseases of biliary tract: Secondary | ICD-10-CM | POA: Diagnosis not present

## 2020-07-02 DIAGNOSIS — K8050 Calculus of bile duct without cholangitis or cholecystitis without obstruction: Secondary | ICD-10-CM | POA: Diagnosis not present

## 2020-07-02 DIAGNOSIS — D649 Anemia, unspecified: Secondary | ICD-10-CM | POA: Diagnosis not present

## 2020-07-02 DIAGNOSIS — Z20822 Contact with and (suspected) exposure to covid-19: Secondary | ICD-10-CM | POA: Diagnosis not present

## 2020-07-02 DIAGNOSIS — K5900 Constipation, unspecified: Secondary | ICD-10-CM | POA: Diagnosis not present

## 2020-07-02 DIAGNOSIS — K8010 Calculus of gallbladder with chronic cholecystitis without obstruction: Secondary | ICD-10-CM | POA: Diagnosis not present

## 2020-07-02 DIAGNOSIS — Z825 Family history of asthma and other chronic lower respiratory diseases: Secondary | ICD-10-CM | POA: Diagnosis not present

## 2020-07-02 HISTORY — PX: CHOLECYSTECTOMY: SHX55

## 2020-07-02 LAB — COMPREHENSIVE METABOLIC PANEL
ALT: 342 U/L — ABNORMAL HIGH (ref 0–44)
AST: 55 U/L — ABNORMAL HIGH (ref 15–41)
Albumin: 3.2 g/dL — ABNORMAL LOW (ref 3.5–5.0)
Alkaline Phosphatase: 117 U/L (ref 38–126)
Anion gap: 7 (ref 5–15)
BUN: <5 mg/dL — ABNORMAL LOW (ref 6–20)
CO2: 24 mmol/L (ref 22–32)
Calcium: 8.7 mg/dL — ABNORMAL LOW (ref 8.9–10.3)
Chloride: 107 mmol/L (ref 98–111)
Creatinine, Ser: 0.57 mg/dL (ref 0.44–1.00)
GFR, Estimated: >60 mL/min (ref >60)
Glucose, Bld: 108 mg/dL — ABNORMAL HIGH (ref 70–99)
Potassium: 4.2 mmol/L (ref 3.5–5.1)
Sodium: 138 mmol/L (ref 135–145)
Total Bilirubin: 1.1 mg/dL (ref 0.3–1.2)
Total Protein: 6.1 g/dL — ABNORMAL LOW (ref 6.5–8.1)

## 2020-07-02 LAB — CBC
HCT: 34.3 % — ABNORMAL LOW (ref 36.0–46.0)
Hemoglobin: 11.8 g/dL — ABNORMAL LOW (ref 12.0–15.0)
MCH: 32 pg (ref 26.0–34.0)
MCHC: 34.4 g/dL (ref 30.0–36.0)
MCV: 93 fL (ref 80.0–100.0)
Platelets: 219 10*3/uL (ref 150–400)
RBC: 3.69 MIL/uL — ABNORMAL LOW (ref 3.87–5.11)
RDW: 12.4 % (ref 11.5–15.5)
WBC: 10.7 10*3/uL — ABNORMAL HIGH (ref 4.0–10.5)
nRBC: 0 % (ref 0.0–0.2)

## 2020-07-02 SURGERY — LAPAROSCOPIC CHOLECYSTECTOMY
Anesthesia: General

## 2020-07-02 MED ORDER — ORAL CARE MOUTH RINSE: 15.0000 mL | Freq: Once | OROMUCOSAL | Status: AC

## 2020-07-02 MED ORDER — ONDANSETRON 4 MG PO TBDP: 4.0000 mg | ORAL_TABLET | Freq: Four times a day (QID) | ORAL | Status: DC | PRN

## 2020-07-02 MED ORDER — KETOROLAC TROMETHAMINE 15 MG/ML IJ SOLN: 15.0000 mg | Freq: Four times a day (QID) | INTRAMUSCULAR | Status: DC | PRN

## 2020-07-02 MED ORDER — MIDAZOLAM HCL 2 MG/2ML IJ SOLN
INTRAMUSCULAR | Status: DC | PRN
  Administered 2020-07-02: 2 mg via INTRAVENOUS

## 2020-07-02 MED ORDER — SCOPOLAMINE 1 MG/3DAYS TD PT72
1.0000 | MEDICATED_PATCH | TRANSDERMAL | Status: DC
  Filled 2020-07-02: qty 1

## 2020-07-02 MED ORDER — FENTANYL CITRATE (PF) 100 MCG/2ML IJ SOLN
INTRAMUSCULAR | Status: AC
  Filled 2020-07-02: qty 2

## 2020-07-02 MED ORDER — FENTANYL CITRATE (PF) 250 MCG/5ML IJ SOLN
INTRAMUSCULAR | Status: DC | PRN
  Administered 2020-07-02: 50 ug via INTRAVENOUS
  Administered 2020-07-02 (×2): 25 ug via INTRAVENOUS
  Administered 2020-07-02: 50 ug via INTRAVENOUS
  Administered 2020-07-02 (×2): 25 ug via INTRAVENOUS
  Administered 2020-07-02: 50 ug via INTRAVENOUS
  Administered 2020-07-02: 25 ug via INTRAVENOUS
  Administered 2020-07-02: 100 ug via INTRAVENOUS
  Administered 2020-07-02: 25 ug via INTRAVENOUS

## 2020-07-02 MED ORDER — ACETAMINOPHEN 10 MG/ML IV SOLN
1000.0000 mg | Freq: Once | INTRAVENOUS | Status: DC | PRN
  Administered 2020-07-02: 1000 mg via INTRAVENOUS

## 2020-07-02 MED ORDER — PROPOFOL 10 MG/ML IV BOLUS
INTRAVENOUS | Status: AC
  Filled 2020-07-02: qty 20

## 2020-07-02 MED ORDER — ACETAMINOPHEN 10 MG/ML IV SOLN
INTRAVENOUS | Status: DC | PRN
  Administered 2020-07-02: 1000 mg via INTRAVENOUS

## 2020-07-02 MED ORDER — AMISULPRIDE (ANTIEMETIC) 5 MG/2ML IV SOLN: 10.0000 mg | Freq: Once | INTRAVENOUS | Status: DC | PRN

## 2020-07-02 MED ORDER — LIDOCAINE 2% (20 MG/ML) 5 ML SYRINGE
INTRAMUSCULAR | Status: DC | PRN
  Administered 2020-07-02: 40 mg via INTRAVENOUS

## 2020-07-02 MED ORDER — ONDANSETRON HCL 4 MG/2ML IJ SOLN
INTRAMUSCULAR | Status: DC | PRN
  Administered 2020-07-02: 4 mg via INTRAVENOUS

## 2020-07-02 MED ORDER — SIMETHICONE 80 MG PO CHEW: 40.0000 mg | CHEWABLE_TABLET | Freq: Four times a day (QID) | ORAL | Status: DC | PRN

## 2020-07-02 MED ORDER — CHLORHEXIDINE GLUCONATE 0.12 % MT SOLN
OROMUCOSAL | Status: AC
  Administered 2020-07-02: 15 mL via OROMUCOSAL
  Filled 2020-07-02: qty 15

## 2020-07-02 MED ORDER — SODIUM CHLORIDE 0.9 % IR SOLN
Status: DC | PRN
  Administered 2020-07-02: 1000 mL

## 2020-07-02 MED ORDER — BUPIVACAINE HCL (PF) 0.25 % IJ SOLN
INTRAMUSCULAR | Status: DC | PRN
  Administered 2020-07-02: 60 mL

## 2020-07-02 MED ORDER — LACTATED RINGERS IV SOLN: INTRAVENOUS | Status: DC

## 2020-07-02 MED ORDER — SCOPOLAMINE 1 MG/3DAYS TD PT72
MEDICATED_PATCH | TRANSDERMAL | Status: AC
  Filled 2020-07-02: qty 1

## 2020-07-02 MED ORDER — FENTANYL CITRATE (PF) 250 MCG/5ML IJ SOLN
INTRAMUSCULAR | Status: AC
  Filled 2020-07-02: qty 5

## 2020-07-02 MED ORDER — EPHEDRINE SULFATE-NACL 50-0.9 MG/10ML-% IV SOSY
PREFILLED_SYRINGE | INTRAVENOUS | Status: DC | PRN
  Administered 2020-07-02 (×2): 10 mg via INTRAVENOUS

## 2020-07-02 MED ORDER — CHLORHEXIDINE GLUCONATE 0.12 % MT SOLN
15.0000 mL | Freq: Once | OROMUCOSAL | Status: AC
  Filled 2020-07-02: qty 15

## 2020-07-02 MED ORDER — OXYCODONE HCL 5 MG PO TABS
5.0000 mg | ORAL_TABLET | Freq: Once | ORAL | Status: AC | PRN
  Administered 2020-07-02: 5 mg via ORAL

## 2020-07-02 MED ORDER — ONDANSETRON HCL 4 MG/2ML IJ SOLN: 4.0000 mg | Freq: Four times a day (QID) | INTRAMUSCULAR | Status: DC | PRN

## 2020-07-02 MED ORDER — MIDAZOLAM HCL 2 MG/2ML IJ SOLN
INTRAMUSCULAR | Status: AC
  Filled 2020-07-02: qty 2

## 2020-07-02 MED ORDER — ENOXAPARIN SODIUM 80 MG/0.8ML IJ SOSY
70.0000 mg | PREFILLED_SYRINGE | INTRAMUSCULAR | Status: DC
  Administered 2020-07-02: 70 mg via SUBCUTANEOUS
  Filled 2020-07-02: qty 0.7

## 2020-07-02 MED ORDER — BUPIVACAINE-EPINEPHRINE (PF) 0.25% -1:200000 IJ SOLN
INTRAMUSCULAR | Status: AC
  Filled 2020-07-02: qty 30

## 2020-07-02 MED ORDER — OXYCODONE HCL 5 MG/5ML PO SOLN: 5.0000 mg | Freq: Once | ORAL | Status: AC | PRN

## 2020-07-02 MED ORDER — SUGAMMADEX SODIUM 200 MG/2ML IV SOLN
INTRAVENOUS | Status: DC | PRN
  Administered 2020-07-02: 400 mg via INTRAVENOUS

## 2020-07-02 MED ORDER — ROCURONIUM BROMIDE 10 MG/ML (PF) SYRINGE
PREFILLED_SYRINGE | INTRAVENOUS | Status: DC | PRN
  Administered 2020-07-02: 10 mg via INTRAVENOUS
  Administered 2020-07-02: 60 mg via INTRAVENOUS

## 2020-07-02 MED ORDER — ACETAMINOPHEN 500 MG PO TABS
1000.0000 mg | ORAL_TABLET | Freq: Four times a day (QID) | ORAL | Status: DC
  Administered 2020-07-02 – 2020-07-03 (×3): 1000 mg via ORAL
  Filled 2020-07-02 (×3): qty 2

## 2020-07-02 MED ORDER — SODIUM CHLORIDE 0.9 % IV SOLN: INTRAVENOUS | Status: DC

## 2020-07-02 MED ORDER — ACETAMINOPHEN 10 MG/ML IV SOLN
INTRAVENOUS | Status: AC
  Filled 2020-07-02: qty 100

## 2020-07-02 MED ORDER — OXYCODONE HCL 5 MG PO TABS
5.0000 mg | ORAL_TABLET | ORAL | Status: DC | PRN
  Administered 2020-07-02 – 2020-07-03 (×5): 10 mg via ORAL
  Filled 2020-07-02 (×5): qty 2

## 2020-07-02 MED ORDER — PROPOFOL 10 MG/ML IV BOLUS
INTRAVENOUS | Status: DC | PRN
  Administered 2020-07-02: 50 mg via INTRAVENOUS
  Administered 2020-07-02: 100 mg via INTRAVENOUS
  Administered 2020-07-02: 50 mg via INTRAVENOUS

## 2020-07-02 MED ORDER — DEXAMETHASONE SODIUM PHOSPHATE 10 MG/ML IJ SOLN
INTRAMUSCULAR | Status: DC | PRN
  Administered 2020-07-02: 10 mg via INTRAVENOUS

## 2020-07-02 MED ORDER — BUPIVACAINE-EPINEPHRINE 0.25% -1:200000 IJ SOLN
INTRAMUSCULAR | Status: DC | PRN
  Administered 2020-07-02: 9 mL

## 2020-07-02 MED ORDER — PROMETHAZINE HCL 25 MG/ML IJ SOLN
6.2500 mg | INTRAMUSCULAR | Status: DC | PRN
Start: 2020-07-02 — End: 2020-07-02

## 2020-07-02 MED ORDER — FENTANYL CITRATE (PF) 100 MCG/2ML IJ SOLN
25.0000 ug | INTRAMUSCULAR | Status: DC | PRN
  Administered 2020-07-02: 50 ug via INTRAVENOUS

## 2020-07-02 MED ORDER — SCOPOLAMINE 1 MG/3DAYS TD PT72
MEDICATED_PATCH | TRANSDERMAL | Status: DC | PRN
  Administered 2020-07-02: 1 via TRANSDERMAL

## 2020-07-02 MED ORDER — OXYCODONE HCL 5 MG PO TABS
ORAL_TABLET | ORAL | Status: AC
  Filled 2020-07-02: qty 1

## 2020-07-02 MED ORDER — 0.9 % SODIUM CHLORIDE (POUR BTL) OPTIME
TOPICAL | Status: DC | PRN
  Administered 2020-07-02: 1000 mL

## 2020-07-02 SURGICAL SUPPLY — 40 items
APPLIER CLIP 5 13 M/L LIGAMAX5 (MISCELLANEOUS) ×2
CANISTER SUCT 3000ML PPV (MISCELLANEOUS) ×2 IMPLANT
CHLORAPREP W/TINT 26 (MISCELLANEOUS) ×2 IMPLANT
CLIP APPLIE 5 13 M/L LIGAMAX5 (MISCELLANEOUS) ×1 IMPLANT
COVER SURGICAL LIGHT HANDLE (MISCELLANEOUS) ×2 IMPLANT
COVER WAND RF STERILE (DRAPES) ×2 IMPLANT
DERMABOND ADHESIVE PROPEN (GAUZE/BANDAGES/DRESSINGS) ×1
DERMABOND ADVANCED (GAUZE/BANDAGES/DRESSINGS) ×1
DERMABOND ADVANCED .7 DNX12 (GAUZE/BANDAGES/DRESSINGS) ×1 IMPLANT
DERMABOND ADVANCED .7 DNX6 (GAUZE/BANDAGES/DRESSINGS) ×1 IMPLANT
ELECT REM PT RETURN 9FT ADLT (ELECTROSURGICAL) ×2
ELECTRODE REM PT RTRN 9FT ADLT (ELECTROSURGICAL) ×1 IMPLANT
ENDOLOOP SUT PDS II  0 18 (SUTURE) ×1
ENDOLOOP SUT PDS II 0 18 (SUTURE) ×1 IMPLANT
GLOVE BIO SURGEON STRL SZ7 (GLOVE) ×2 IMPLANT
GLOVE BIOGEL PI IND STRL 7.5 (GLOVE) ×1 IMPLANT
GLOVE BIOGEL PI INDICATOR 7.5 (GLOVE) ×1
GOWN STRL REUS W/ TWL LRG LVL3 (GOWN DISPOSABLE) ×3 IMPLANT
GOWN STRL REUS W/TWL LRG LVL3 (GOWN DISPOSABLE) ×3
GRASPER SUT TROCAR 14GX15 (MISCELLANEOUS) ×2 IMPLANT
KIT BASIN OR (CUSTOM PROCEDURE TRAY) ×2 IMPLANT
KIT TURNOVER KIT B (KITS) ×2 IMPLANT
NS IRRIG 1000ML POUR BTL (IV SOLUTION) ×2 IMPLANT
PAD ARMBOARD 7.5X6 YLW CONV (MISCELLANEOUS) ×2 IMPLANT
POUCH RETRIEVAL ECOSAC 10 (ENDOMECHANICALS) ×1 IMPLANT
POUCH RETRIEVAL ECOSAC 10MM (ENDOMECHANICALS) ×1
SCISSORS LAP 5X35 DISP (ENDOMECHANICALS) ×2 IMPLANT
SET IRRIG TUBING LAPAROSCOPIC (IRRIGATION / IRRIGATOR) ×2 IMPLANT
SET TUBE SMOKE EVAC HIGH FLOW (TUBING) ×2 IMPLANT
SLEEVE ENDOPATH XCEL 5M (ENDOMECHANICALS) ×4 IMPLANT
SPECIMEN JAR SMALL (MISCELLANEOUS) ×2 IMPLANT
STRIP CLOSURE SKIN 1/2X4 (GAUZE/BANDAGES/DRESSINGS) ×2 IMPLANT
SUT MNCRL AB 4-0 PS2 18 (SUTURE) ×2 IMPLANT
SUT VICRYL 0 UR6 27IN ABS (SUTURE) ×4 IMPLANT
TOWEL GREEN STERILE (TOWEL DISPOSABLE) ×2 IMPLANT
TOWEL GREEN STERILE FF (TOWEL DISPOSABLE) ×2 IMPLANT
TRAY LAPAROSCOPIC MC (CUSTOM PROCEDURE TRAY) ×2 IMPLANT
TROCAR XCEL BLUNT TIP 100MML (ENDOMECHANICALS) ×2 IMPLANT
TROCAR XCEL NON-BLD 5MMX100MML (ENDOMECHANICALS) ×2 IMPLANT
WATER STERILE IRR 1000ML POUR (IV SOLUTION) ×2 IMPLANT

## 2020-07-02 NOTE — Anesthesia Procedure Notes (Signed)
Anesthesia Regional Block: TAP block   Pre-Anesthetic Checklist: ,, timeout performed, Correct Patient, Correct Site, Correct Laterality, Correct Procedure, Correct Position, site marked, Risks and benefits discussed,  Surgical consent,  Pre-op evaluation,  At surgeon's request and post-op pain management  Laterality: Right  Prep: chloraprep       Needles:  Injection technique: Single-shot  Needle Type: Echogenic Stimulator Needle     Needle Length: 10cm  Needle Gauge: 20     Additional Needles:   Procedures:,,,, ultrasound used (permanent image in chart),,,,  Narrative:  Start time: 07/02/2020 11:05 AM End time: 07/02/2020 11:10 AM Injection made incrementally with aspirations every 5 mL.  Performed by: Personally  Anesthesiologist: Leonides Grills, MD  Additional Notes: Functioning IV was confirmed and monitors were applied.  A timeout was performed. Sterile prep, hand hygiene and sterile gloves were used. A 20ga BBraun echogenic stimulator needle was used. Negative aspiration and negative test dose prior to incremental administration of local anesthetic. The patient tolerated the procedure well.  Ultrasound guidance: relevent anatomy identified, needle position confirmed, local anesthetic spread visualized around nerve(s), vascular puncture avoided.  Image printed for medical record.

## 2020-07-02 NOTE — Progress Notes (Signed)
PROGRESS NOTE    Connie Fisher  WFU:[id number] DOB: 09-04-1995 DOA: 06/29/2020 PCP: Pcp, No    Brief Narrative:  Past medical history of morbid obesity.  Presents with complaints of abdominal pain with nausea and vomiting.  Found to have biliary colic with choledocholithiasis. GI consulted. 4/27-ERCP plan for today  4/28-GSX consulted this am, plan for lap chol. today  Consultants:   GI  Procedures:   Antimicrobials:    unasyn ppx  Subjective: No abd pain. No n/v  Objective: Vitals:   07/02/20 1232 07/02/20 1244 07/02/20 1245 07/02/20 1251  BP: (!) 111/51 (!) 106/51    Pulse:  79 76 74  Resp:  (!) 23 (!) 21 (!) 22  Temp:    (!) 97.3 F (36.3 C)  TempSrc:      SpO2:  100% 100% 100%  Weight:      Height:        Intake/Output Summary (Last 24 hours) at 07/02/2020 1407 Last data filed at 07/02/2020 1219 Gross per 24 hour  Intake 2000 ml  Output 5 ml  Net 1995 ml   Filed Weights   07/02/20 1029  Weight: (!) 140 kg    Examination:  Nad, calm, pleasant cta no w/r/r rrr s1/s2 no gallop Soft benign +bs No edema  Aaxox3, grossly intact      Data Reviewed: I have personally reviewed following labs and imaging studies  CBC: Recent Labs  Lab 06/26/20 2101 06/28/20 1900 06/29/20 1628 06/30/20 0412 07/01/20 0300 07/02/20 0453  WBC 12.3* 8.7 8.3 9.0 8.3 10.7*  NEUTROABS 9.5* 6.0  --   --   --   --   HGB 13.6 13.4 13.6 12.2 11.9* 11.8*  HCT 41.6 40.3 41.9 37.0 36.5 34.3*  MCV 95.2 94.2 97.7 95.1 96.3 93.0  PLT 262 256 252 228 216 219   Basic Metabolic Panel: Recent Labs  Lab 06/28/20 1900 06/29/20 1628 06/30/20 0412 07/01/20 0300 07/02/20 0453  NA 137 135 136 139 138  K 4.0 3.6 4.0 4.1 4.2  CL 105 99 106 109 107  CO2 24 25 21* 25 24  GLUCOSE 99 107* 101* 92 108*  BUN 5* 8 8 <5* <5*  CREATININE 0.67 0.82 0.66 0.63 0.57  CALCIUM 9.0 8.8* 8.5* 8.5* 8.7*   GFR: Estimated Creatinine Clearance: 148.3 mL/min (by C-G formula based on SCr of  0.57 mg/dL). Liver Function Tests: Recent Labs  Lab 06/28/20 1900 06/29/20 1628 06/30/20 0412 07/01/20 0300 07/02/20 0453  AST 509* 292* 308* 125* 55*  ALT 845* 685* 631* 493* 342*  ALKPHOS 143* 152* 137* 134* 117  BILITOT 3.4* 5.6* 4.7* 1.7* 1.1  PROT 7.0 7.0 6.1* 6.0* 6.1*  ALBUMIN 3.9 3.8 3.3* 3.3* 3.2*   Recent Labs  Lab 06/26/20 2101 06/28/20 1900 06/29/20 1628  LIPASE 34 31 42   No results for input(s): AMMONIA in the last 168 hours. Coagulation Profile: No results for input(s): INR, PROTIME in the last 168 hours. Cardiac Enzymes: No results for input(s): CKTOTAL, CKMB, CKMBINDEX, TROPONINI in the last 168 hours. BNP (last 3 results) No results for input(s): PROBNP in the last 8760 hours. HbA1C: No results for input(s): HGBA1C in the last 72 hours. CBG: No results for input(s): GLUCAP in the last 168 hours. Lipid Profile: No results for input(s): CHOL, HDL, LDLCALC, TRIG, CHOLHDL, LDLDIRECT in the last 72 hours. Thyroid Function Tests: No results for input(s): TSH, T4TOTAL, FREET4, T3FREE, THYROIDAB in the last 72 hours. Anemia Panel: No results for input(s):  VITAMINB12, FOLATE, FERRITIN, TIBC, IRON, RETICCTPCT in the last 72 hours. Sepsis Labs: No results for input(s): PROCALCITON, LATICACIDVEN in the last 168 hours.  Recent Results (from the past 240 hour(s))  Resp Panel by RT-PCR (Flu A&B, Covid) Nasopharyngeal Swab     Status: None   Collection Time: 06/30/20  2:03 AM   Specimen: Nasopharyngeal Swab; Nasopharyngeal(NP) swabs in vial transport medium  Result Value Ref Range Status   SARS Coronavirus 2 by RT PCR NEGATIVE NEGATIVE Final    Comment: (NOTE) SARS-CoV-2 target nucleic acids are NOT DETECTED.  The SARS-CoV-2 RNA is generally detectable in upper respiratory specimens during the acute phase of infection. The lowest concentration of SARS-CoV-2 viral copies this assay can detect is 138 copies/mL. A negative result does not preclude  SARS-Cov-2 infection and should not be used as the sole basis for treatment or other patient management decisions. A negative result may occur with  improper specimen collection/handling, submission of specimen other than nasopharyngeal swab, presence of viral mutation(s) within the areas targeted by this assay, and inadequate number of viral copies(<138 copies/mL). A negative result must be combined with clinical observations, patient history, and epidemiological information. The expected result is Negative.  Fact Sheet for Patients:  BloggerCourse.com  Fact Sheet for Healthcare Providers:  SeriousBroker.it  This test is no t yet approved or cleared by the [place] FDA and  has been authorized for detection and/or diagnosis of SARS-CoV-2 by FDA under an Emergency Use Authorization (EUA). This EUA will remain  in effect (meaning this test can be used) for the duration of the COVID-19 declaration under Section 564(b)(1) of the Act, 21 U.S.C.section 360bbb-3(b)(1), unless the authorization is terminated  or revoked sooner.       Influenza A by PCR NEGATIVE NEGATIVE Final   Influenza B by PCR NEGATIVE NEGATIVE Final    Comment: (NOTE) The Xpert Xpress SARS-CoV-2/FLU/RSV plus assay is intended as an aid in the diagnosis of influenza from Nasopharyngeal swab specimens and should not be used as a sole basis for treatment. Nasal washings and aspirates are unacceptable for Xpert Xpress SARS-CoV-2/FLU/RSV testing.  Fact Sheet for Patients: BloggerCourse.com  Fact Sheet for Healthcare Providers: SeriousBroker.it  This test is not yet approved or cleared by the [place] FDA and has been authorized for detection and/or diagnosis of SARS-CoV-2 by FDA under an Emergency Use Authorization (EUA). This EUA will remain in effect (meaning this test can be used) for the duration of  the COVID-19 declaration under Section 564(b)(1) of the Act, 21 U.S.C. section 360bbb-3(b)(1), unless the authorization is terminated or revoked.  Performed at [facility], [address], [place], [place] [address]          Radiology Studies: DG ERCP BILIARY & PANCREATIC DUCTS  Result Date: 07/01/2020 CLINICAL DATA:  ERCP for choledocholithiasis. EXAM: ERCP TECHNIQUE: Multiple spot images obtained with the fluoroscopic device and submitted for interpretation post-procedure. COMPARISON:  MRCP-06/29/2020 FLUOROSCOPY TIME:  21 minutes, 43 seconds (236 mGy) FINDINGS: 17 spot intraoperative fluoroscopic images the right upper abdominal quadrant during ERCP are provided for review Initial image demonstrates an ERCP probe overlying the right upper abdominal quadrant. Subsequent images demonstrate selective cannulation and eventual opacification of the pancreatic duct which appears nondilated. Subsequent images than demonstrate selective cannulation and opacification of the common bile duct which also appears nondilated. There is inflation of a balloon within distal aspect of the CBD with subsequent biliary sweeping and presumed sphincterotomy. There is slight opacification of the gallbladder  lumen without significant opacification of the cystic duct. There is minimal opacification of the intrahepatic biliary tree which appears nondilated. IMPRESSION: ERCP with biliary sweeping and presumed sphincterotomy as above. These images were submitted for radiologic interpretation only. Please see the procedural report for the amount of contrast and the fluoroscopy time utilized. Electronically Signed   By: Simonne Come M.D.   On: 07/01/2020 19:51        Scheduled Meds: . acetaminophen  1,000 mg Oral Q6H  . enoxaparin (LOVENOX) injection  40 mg Subcutaneous Q24H  . fentaNYL      . oxyCODONE       Continuous Infusions: . sodium chloride    . acetaminophen      Assessment & Plan:   Principal  Problem:   Choledocholithiasis   1.  Abdominal pain from biliary colic. Choledocholithiasis and gallbladder stones. Elevated LFT, hyperbilirubinemia Presents with abdominal pain.  LFT in 100s. At peak AST 509, ALT 845.  Now trending down but stable.  Ultrasound liver shows evidence of gallstones with dilated CBD. No evidence of acute cholecystitis or cholangitis. MRCP performed shows CBD stone with obstruction. 4/28-s/p ERCP . S/p Sphincterotomy with stone extraction No abd pain. LFT trending down. gsx consulted, plan for lap chol today Keep npo    2.  Hepatic steatosis May benefit from a outpt wt loss program   3.  Morbid obesity Reported BMI 54.75. Complicates overall health  4.  History of GERD Was on PPI, not currently taking.   DVT prophylaxis: scd Code Status:full Family Communication: None at bedside  Status is: Inpatient  Remains inpatient appropriate because:Inpatient level of care appropriate due to severity of illness   Dispo: The patient is from: Home              Anticipated d/c is to: Home              Patient currently is not medically stable to d/c.   Difficult to place patient No            LOS: 3 days   Time spent: 35 minutes with more than 50% on COC's    Lynn Ito, MD [facility] Pager [phone]  If 7PM-7AM, please contact night-coverage 07/02/2020, 2:07 PM

## 2020-07-02 NOTE — Anesthesia Preprocedure Evaluation (Addendum)
Anesthesia Evaluation  Patient identified by MRN, date of birth, ID band Patient awake    Reviewed: Allergy & Precautions, NPO status , Patient's Chart, lab work & pertinent test results  Airway Mallampati: II  TM Distance: >3 FB Neck ROM: Full    Dental  (+) Missing, Poor Dentition, Chipped,    Pulmonary neg pulmonary ROS,    Pulmonary exam normal breath sounds clear to auscultation       Cardiovascular negative cardio ROS Normal cardiovascular exam Rhythm:Regular Rate:Normal     Neuro/Psych negative neurological ROS  negative psych ROS   GI/Hepatic negative GI ROS, Neg liver ROS,   Endo/Other  Morbid obesity (SUPER)  Renal/GU negative Renal ROS     Musculoskeletal negative musculoskeletal ROS (+)   Abdominal (+) + obese,   Peds  Hematology  (+) anemia ,   Anesthesia Other Findings GALLSTONES  Reproductive/Obstetrics                            Anesthesia Physical Anesthesia Plan  ASA: IV  Anesthesia Plan: General and Regional   Post-op Pain Management: GA combined w/ Regional for post-op pain   Induction: Intravenous  PONV Risk Score and Plan: 4 or greater and Ondansetron, Dexamethasone, Midazolam, Treatment may vary due to age or medical condition and Scopolamine patch - Pre-op  Airway Management Planned: Oral ETT  Additional Equipment:   Intra-op Plan:   Post-operative Plan: Extubation in OR  Informed Consent: I have reviewed the patients History and Physical, chart, labs and discussed the procedure including the risks, benefits and alternatives for the proposed anesthesia with the patient or authorized representative who has indicated his/her understanding and acceptance.     Dental advisory given  Plan Discussed with: CRNA  Anesthesia Plan Comments:       Anesthesia Quick Evaluation

## 2020-07-02 NOTE — Anesthesia Procedure Notes (Signed)
Anesthesia Regional Block: TAP block   Pre-Anesthetic Checklist: ,, timeout performed, Correct Patient, Correct Site, Correct Laterality, Correct Procedure, Correct Position, site marked, Risks and benefits discussed,  Surgical consent,  Pre-op evaluation,  At surgeon's request and post-op pain management  Laterality: Left  Prep: chloraprep       Needles:  Injection technique: Single-shot  Needle Type: Echogenic Stimulator Needle     Needle Length: 10cm  Needle Gauge: 20     Additional Needles:   Procedures:,,,, ultrasound used (permanent image in chart),,,,  Narrative:  Start time: 07/02/2020 11:10 AM End time: 07/02/2020 11:15 AM Injection made incrementally with aspirations every 5 mL.  Performed by: Personally  Anesthesiologist: Leonides Grills, MD  Additional Notes: Functioning IV was confirmed and monitors were applied.  A timeout was performed. Sterile prep, hand hygiene and sterile gloves were used. A 20ga BBraun echogenic stimulator needle was used. Negative aspiration and negative test dose prior to incremental administration of local anesthetic. The patient tolerated the procedure well.  Ultrasound guidance: relevent anatomy identified, needle position confirmed, local anesthetic spread visualized around nerve(s), vascular puncture avoided.  Image printed for medical record.

## 2020-07-02 NOTE — Plan of Care (Signed)
  Problem: Activity: Goal: Risk for activity intolerance will decrease Outcome: Progressing   Problem: Pain Managment: Goal: General experience of comfort will improve Outcome: Progressing   Problem: Safety: Goal: Ability to remain free from injury will improve Outcome: Progressing   

## 2020-07-02 NOTE — Anesthesia Procedure Notes (Signed)

## 2020-07-02 NOTE — Discharge Instructions (Signed)
[facility] LAPAROSCOPIC SURGERY: POST OP INSTRUCTIONS  Always review your discharge instruction sheet given to you by the facility where your surgery was performed. IF YOU HAVE DISABILITY OR FAMILY LEAVE FORMS, YOU MUST BRING THEM TO THE OFFICE FOR PROCESSING.   DO NOT GIVE THEM TO YOUR DOCTOR.  1. A prescription for pain medication may be given to you upon discharge.  Take your pain medication as prescribed, if needed.  If narcotic pain medicine is not needed, then you may take acetaminophen (Tylenol), naprosyn (Alleve), or ibuprofen (Advil) as needed. 2. Take your usually prescribed medications unless otherwise directed. 3. If you need a refill on your pain medication, please contact your pharmacy.  They will contact our office to request authorization. Prescriptions will not be filled after 5pm or on week-ends. 4. You should follow a light diet the first few days after arrival home, such as soup and crackers, etc.  Be sure to include lots of fluids daily. 5. Most patients will experience some swelling and bruising in the area of the incisions.  Ice packs will help.  Swelling and bruising can take several days to resolve.  6. It is common to experience some constipation if taking pain medication after surgery.  Increasing fluid intake and taking a stool softener (such as Colace) will usually help or prevent this problem from occurring.  A mild laxative (Milk of Magnesia or Miralax) should be taken according to package instructions if there are no bowel movements after 48 hours. 7. Unless discharge instructions indicate otherwise, you may remove your bandages 48 hours after surgery, and you may shower at that time.  You may have steri-strips (small skin tapes) in place directly over the incision.  These strips should be left on the skin for 7-10 days.  If your surgeon used skin glue on the incision, you may shower in 24 hours.  The glue will flake  off over the next 2-3 weeks.  Any sutures or staples will be removed at the office during your follow-up visit. 8. ACTIVITIES:  You may resume regular (light) daily activities beginning the next day--such as daily self-care, walking, climbing stairs--gradually increasing activities as tolerated.  You may have sexual intercourse when it is comfortable.  Refrain from any heavy lifting or straining until approved by your doctor. a. You may drive when you are no longer taking prescription pain medication, you can comfortably wear a seatbelt, and you can safely maneuver your car and apply brakes. b. RETURN TO WORK:  __________________________________________________________ 9. You should see your doctor in the office for a follow-up appointment approximately 2-3 weeks after your surgery.  Make sure that you call for this appointment within a day or two after you arrive home to insure a convenient appointment time. 10. OTHER INSTRUCTIONS: __________________________________________________________________________________________________________________________ __________________________________________________________________________________________________________________________ WHEN TO CALL YOUR DOCTOR: 1. Fever over 101.0 2. Inability to urinate 3. Continued bleeding from incision. 4. Increased pain, redness, or drainage from the incision. 5. Increasing abdominal pain  The clinic staff is available to answer your questions during regular business hours.  Please don't hesitate to call and ask to speak to one of the nurses for clinical concerns.  If you have a medical emergency, go to the nearest emergency room or call 911.  A surgeon from [facility] is always on call at the hospital. [address], [address], [place], [place]  [address] ? [address] [address], [place], [place]   [address] [phone] ? [phone] ? [phone]   [phone] Web site: [email or web address]  

## 2020-07-02 NOTE — Transfer of Care (Signed)
Immediate Anesthesia Transfer of Care Note  Patient: Connie Fisher  Procedure(s) Performed: LAPAROSCOPIC CHOLECYSTECTOMY (N/A )  Patient Location: PACU  Anesthesia Type:General  Level of Consciousness: awake, alert  and oriented  Airway & Oxygen Therapy: Patient Spontanous Breathing  Post-op Assessment: Report given to RN and Post -op Vital signs reviewed and stable  Post vital signs: Reviewed and stable  Last Vitals:  Vitals Value Taken Time  BP 111/51 07/02/20 1232  Temp    Pulse 80 07/02/20 1233  Resp 20 07/02/20 1233  SpO2 100 % 07/02/20 1233  Vitals shown include unvalidated device data.  Last Pain:  Vitals:   07/02/20 0910  TempSrc: Oral  PainSc: 0-No pain         Complications: No complications documented.

## 2020-07-02 NOTE — Consult Note (Signed)
Reason for Consult:gallstones s/p ercp Referring Physician: Dr Connie Fisher is an 25 y.o. female.  HPI: 25 yo otherwise healthy female had some symptoms during pregnancy (has a one year old) that developed ab pain, n/v.  Pain worse after eating.  She was seen in er initially but left.  Then returned with continued symptoms and was found to have elevated tb, gallstones on [place] and mrcp with choledocholithiasis. She was admitted and underwent ercp yesterday with some small fragments noted.  She has no symptoms currently and we were asked to see her for cholecystectomy.  Past Medical History:  Diagnosis Date  . Heart murmur    AS A BABY  . Morbid obesity (HCC)     Past Surgical History:  Procedure Laterality Date  . NO PAST SURGERIES      Family History  Problem Relation Age of Onset  . Deep vein thrombosis Mother   . Kidney Stones Mother   . Diabetes Father   . Asthma Brother   . Arthritis Paternal Uncle   . Cancer Paternal Uncle   . Diabetes Paternal Grandmother     Social History:  reports that she has never smoked. She has never used smokeless tobacco. She reports previous alcohol use. She reports that she does not use drugs.  Allergies: No Known Allergies  Medications: I have reviewed the patient's current medications.  Results for orders placed or performed during the hospital encounter of 06/29/20 (from the past 48 hour(s))  Comprehensive metabolic panel     Status: Abnormal   Collection Time: 07/01/20  3:00 AM  Result Value Ref Range   Sodium 139 135 - 145 mmol/L   Potassium 4.1 3.5 - 5.1 mmol/L   Chloride 109 98 - 111 mmol/L   CO2 25 22 - 32 mmol/L   Glucose, Bld 92 70 - 99 mg/dL    Comment: Glucose reference range applies only to samples taken after fasting for at least 8 hours.   BUN <5 (L) 6 - 20 mg/dL   Creatinine, Ser 7.82 0.44 - 1.00 mg/dL   Calcium 8.5 (L) 8.9 - 10.3 mg/dL   Total Protein 6.0 (L) 6.5 - 8.1 g/dL   Albumin 3.3 (L) 3.5 - 5.0 g/dL    AST 956 (H) 15 - 41 U/L   ALT 493 (H) 0 - 44 U/L   Alkaline Phosphatase 134 (H) 38 - 126 U/L   Total Bilirubin 1.7 (H) 0.3 - 1.2 mg/dL   GFR, Estimated >21 >30 mL/min    Comment: (NOTE) Calculated using the CKD-EPI Creatinine Equation (2021)    Anion gap 5 5 - 15    Comment: Performed at [facility], [address] [place], [place] [address]  CBC     Status: Abnormal   Collection Time: 07/01/20  3:00 AM  Result Value Ref Range   WBC 8.3 4.0 - 10.5 K/uL   RBC 3.79 (L) 3.87 - 5.11 MIL/uL   Hemoglobin 11.9 (L) 12.0 - 15.0 g/dL   HCT 46.9 62.9 - 52.8 %   MCV 96.3 80.0 - 100.0 fL   MCH 31.4 26.0 - 34.0 pg   MCHC 32.6 30.0 - 36.0 g/dL   RDW 41.3 24.4 - 01.0 %   Platelets 216 150 - 400 K/uL   nRBC 0.0 0.0 - 0.2 %    Comment: Performed at [facility], [address] [place], [place] [address]  Comprehensive metabolic panel     Status: Abnormal  Collection Time: 07/02/20  4:53 AM  Result Value Ref Range   Sodium 138 135 - 145 mmol/L   Potassium 4.2 3.5 - 5.1 mmol/L   Chloride 107 98 - 111 mmol/L   CO2 24 22 - 32 mmol/L   Glucose, Bld 108 (H) 70 - 99 mg/dL    Comment: Glucose reference range applies only to samples taken after fasting for at least 8 hours.   BUN <5 (L) 6 - 20 mg/dL   Creatinine, Ser 0.09 0.44 - 1.00 mg/dL   Calcium 8.7 (L) 8.9 - 10.3 mg/dL   Total Protein 6.1 (L) 6.5 - 8.1 g/dL   Albumin 3.2 (L) 3.5 - 5.0 g/dL   AST 55 (H) 15 - 41 U/L   ALT 342 (H) 0 - 44 U/L   Alkaline Phosphatase 117 38 - 126 U/L   Total Bilirubin 1.1 0.3 - 1.2 mg/dL   GFR, Estimated >38 >18 mL/min    Comment: (NOTE) Calculated using the CKD-EPI Creatinine Equation (2021)    Anion gap 7 5 - 15    Comment: Performed at [facility], [address] [place], [place] [address]  CBC     Status: Abnormal   Collection Time: 07/02/20  4:53 AM  Result Value Ref Range   WBC 10.7 (H) 4.0 - 10.5 K/uL   RBC 3.69 (L) 3.87 - 5.11 MIL/uL   Hemoglobin 11.8 (L) 12.0 - 15.0 g/dL    HCT 16.9 (L) 67.8 - 46.0 %   MCV 93.0 80.0 - 100.0 fL   MCH 32.0 26.0 - 34.0 pg   MCHC 34.4 30.0 - 36.0 g/dL   RDW 93.8 10.1 - 75.1 %   Platelets 219 150 - 400 K/uL   nRBC 0.0 0.0 - 0.2 %    Comment: Performed at [facility], [address] [place], [place] [address]    DG ERCP BILIARY & PANCREATIC DUCTS  Result Date: 07/01/2020 CLINICAL DATA:  ERCP for choledocholithiasis. EXAM: ERCP TECHNIQUE: Multiple spot images obtained with the fluoroscopic device and submitted for interpretation post-procedure. COMPARISON:  MRCP-06/29/2020 FLUOROSCOPY TIME:  21 minutes, 43 seconds (236 mGy) FINDINGS: 17 spot intraoperative fluoroscopic images the right upper abdominal quadrant during ERCP are provided for review Initial image demonstrates an ERCP probe overlying the right upper abdominal quadrant. Subsequent images demonstrate selective cannulation and eventual opacification of the pancreatic duct which appears nondilated. Subsequent images than demonstrate selective cannulation and opacification of the common bile duct which also appears nondilated. There is inflation of a balloon within distal aspect of the CBD with subsequent biliary sweeping and presumed sphincterotomy. There is slight opacification of the gallbladder lumen without significant opacification of the cystic duct. There is minimal opacification of the intrahepatic biliary tree which appears nondilated. IMPRESSION: ERCP with biliary sweeping and presumed sphincterotomy as above. These images were submitted for radiologic interpretation only. Please see the procedural report for the amount of contrast and the fluoroscopy time utilized. Electronically Signed   By: Simonne Come M.D.   On: 07/01/2020 19:51    Review of Systems  Gastrointestinal: Negative for abdominal pain.  All other systems reviewed and are negative.  Blood pressure (!) 106/56, pulse 71, temperature 97.8 F (36.6 C), temperature source Oral, resp. rate 17, last  menstrual period 06/03/2020, SpO2 100 %, not currently breastfeeding. Physical Exam Constitutional:      Appearance: She is well-developed.  HENT:     Head: Normocephalic and atraumatic.  Cardiovascular:  Rate and Rhythm: Normal rate and regular rhythm.  Pulmonary:     Effort: Pulmonary effort is normal. No respiratory distress.     Breath sounds: No wheezing.  Abdominal:     General: There is no distension.     Palpations: Abdomen is soft.     Tenderness: There is no abdominal tenderness.     Hernia: No hernia is present.  Skin:    General: Skin is warm and dry.     Capillary Refill: Capillary refill takes less than 2 seconds.  Neurological:     General: No focal deficit present.     Mental Status: She is alert.  Psychiatric:        Mood and Affect: Mood normal.        Behavior: Behavior normal.     Assessment/Plan: Choledocholithiaisis Gallstones -ercp yesterday, doing well -plan for lap chole today, hopefully home tomorrow -I discussed the procedure in detail.  We discussed the risks and benefits of a laparoscopic cholecystectomy and possible cholangiogram including, but not limited to bleeding, infection, injury to surrounding structures such as the intestine or liver, bile leak, retained gallstones, need to convert to an open procedure, prolonged diarrhea, blood clots such as  DVT, common bile duct injury, anesthesia risks, and possible need for additional procedures.  The likelihood of improvement in symptoms and return to the patient's normal status is good. We discussed the typical post-operative recovery course.   Connie Fisher 07/02/2020, 9:02 AM

## 2020-07-02 NOTE — Op Note (Signed)
Preoperative diagnosis: Gallstones, choledocholithiasis s/p ercp Postoperative diagnosis: Same as above Surgery: Laparoscopic cholecystectomy Surgeon: Dr. Harden Mo Asst: Carl Best, PA-C Anesthesia: General Estimated blood loss: Minimal Drains: None Specimens: Gallbladder and contents to pathology Complications: None Special count was correct x2 at end of operation Disposition to recovery stable condition  Indications: 25 yo otherwise healthy female had some symptoms during pregnancy (has a one year old) that developed ab pain, n/v.  Pain worse after eating.  She was seen in er initially but left.  Then returned with continued symptoms and was found to have elevated tb, gallstones on [place] and mrcp with choledocholithiasis. She was admitted and underwent ercp yesterday with some small fragments noted. We discussed proceeding with lap chole today.  Procedure: After informed consent was obtained the patient was taken to the operating room. She was given antibiotics. SCDs were in place. She was placed under general anesthesia without complication. she underwent a bilateral TAP block. She was prepped and draped in the standard sterile surgical fashion. Surgical timeout was then performed.  I infiltrated Marcaine below her umbilicus. I made a vertical incision. I dissected down and grasped the fascia. I incised this sharply and entered the peritoneum bluntly. There was no evidence of an entry injury. I placed a 0 Vicryl pursestring suture through the fascia. I then inserted a Hassan trocar and insufflated the abdomen to 15 mmHg pressure. I then inserted 3 further 5 mm trocars in epigastrium and right side of the abdomen under direct vision without complication.  I then retracted her gallbladder cephalad. There were omental adhesions that I took down bluntly. I then was able to retract it laterally. I eventually was able to dissect the triangle and obtain the critical view of  safety. I took the gallbladder off the cystic plate of the liver to ensure that this was the correct view. I then clipped the cystic duct with 3 clips and divided it. I left 2 clips in place. The duct was viable and the clips completely traversed the duct but just barely. I did place an endoloop on the cystic duct stump as well. I treated the artery in a similar fashion. I then took the gallbladder off the liver bed and placed it in a retrieval bag.  I then tied my pursestring down. I placed an additional 0 vicryl suture with the suture passer to completely obliterate the defect as well. Hemostasis was observed. I irrigated and this was clear. I then desufflated the abdomen remove the remaining trocars. I closed these with 4-0 Monocryl and glue. She tolerated this well was extubated and transferred to recovery stable.

## 2020-07-03 ENCOUNTER — Encounter (HOSPITAL_COMMUNITY): Payer: Self-pay | Admitting: Internal Medicine

## 2020-07-03 DIAGNOSIS — K8050 Calculus of bile duct without cholangitis or cholecystitis without obstruction: Secondary | ICD-10-CM | POA: Diagnosis not present

## 2020-07-03 LAB — COMPREHENSIVE METABOLIC PANEL
ALT: 269 U/L — ABNORMAL HIGH (ref 0–44)
AST: 58 U/L — ABNORMAL HIGH (ref 15–41)
Albumin: 3.1 g/dL — ABNORMAL LOW (ref 3.5–5.0)
Alkaline Phosphatase: 104 U/L (ref 38–126)
Anion gap: 4 — ABNORMAL LOW (ref 5–15)
BUN: 5 mg/dL — ABNORMAL LOW (ref 6–20)
CO2: 26 mmol/L (ref 22–32)
Calcium: 8.5 mg/dL — ABNORMAL LOW (ref 8.9–10.3)
Chloride: 107 mmol/L (ref 98–111)
Creatinine, Ser: 0.69 mg/dL (ref 0.44–1.00)
GFR, Estimated: >60 mL/min (ref >60)
Glucose, Bld: 118 mg/dL — ABNORMAL HIGH (ref 70–99)
Potassium: 4 mmol/L (ref 3.5–5.1)
Sodium: 137 mmol/L (ref 135–145)
Total Bilirubin: 0.9 mg/dL (ref 0.3–1.2)
Total Protein: 6 g/dL — ABNORMAL LOW (ref 6.5–8.1)

## 2020-07-03 LAB — CBC
HCT: 33.8 % — ABNORMAL LOW (ref 36.0–46.0)
Hemoglobin: 11.1 g/dL — ABNORMAL LOW (ref 12.0–15.0)
MCH: 31.4 pg (ref 26.0–34.0)
MCHC: 32.8 g/dL (ref 30.0–36.0)
MCV: 95.8 fL (ref 80.0–100.0)
Platelets: 207 10*3/uL (ref 150–400)
RBC: 3.53 MIL/uL — ABNORMAL LOW (ref 3.87–5.11)
RDW: 12.9 % (ref 11.5–15.5)
WBC: 12.8 10*3/uL — ABNORMAL HIGH (ref 4.0–10.5)
nRBC: 0 % (ref 0.0–0.2)

## 2020-07-03 LAB — SURGICAL PATHOLOGY

## 2020-07-03 MED ORDER — OXYCODONE HCL 5 MG PO TABS: 5.0000 mg | ORAL_TABLET | Freq: Four times a day (QID) | ORAL | 0 refills | Status: DC | PRN

## 2020-07-03 MED ORDER — ACETAMINOPHEN 500 MG PO TABS: 1000.0000 mg | ORAL_TABLET | Freq: Four times a day (QID) | ORAL | 0 refills | Status: DC | PRN

## 2020-07-03 NOTE — Discharge Summary (Signed)
Connie Fisher ZOX:[id number] DOB: 1995/11/11 DOA: 06/29/2020  PCP: Pcp, No  Admit date: 06/29/2020 Discharge date: 07/03/2020  Admitted From: Home Disposition: Home  Recommendations for Outpatient Follow-up:  1. Follow up with PCP in 1 week 2. Please obtain BMP/CBC in one week 3. Dr. Donne Hazel  general surgery in 2 weeks     Discharge Condition:Stable CODE STATUS: Full Diet recommendation: Heart Healthy  Brief/Interim Summary: Per WJX:BJYNWG [name] is a 25 y.o. female with medical history significant for BMI 55, presenting to the emergency department for evaluation of epigastric pain, nausea, and vomiting.  Patient reported that she developed nausea approximately 5 days ago and has been having epigastric pain and vomiting for the past 4 days.  She was evaluated at an urgent care where she was noted to have elevated LFTs and directed to the ED.  Pain is localized to epigastrium and moderate to severe in intensity. She denies fevers.   ED Course:  CBC is unremarkable.  MRCP remarkable for choledocholithiasis.  Patient was treated with fentanyl and Zofran in the ED, gastroenterology was consulted   1.Abdominal pain from biliary colic. Choledocholithiasis and gallbladder stones. Elevated LFT, hyperbilirubinemia Presents with abdominal pain. LFT in 100s. At peak AST 509, ALT 845. Now trending down but stable. Ultrasound liver shows evidence of gallstones with dilated CBD. No evidence of acute cholecystitis or cholangitis. MRCP performed shows CBD stone with obstruction. 4/27-s/p ERCP . S/p Sphincterotomy with stone extraction LFT trending down. Surgery was then consulted, pt is  S/p laparoscopic cholecystectomy 07/02/20 Dr. Donne Hazel Patient tolerated diet.  Bowels functioning.  Surgery was okay and stable for discharge.  They felt she does not need antibiotics from surgical standpoint. Will need to follow-up with surgery in 2 weeks   2.Hepatic steatosis May benefit from a  outpt weight loss program   3.Morbid obesity Reported BMI 54.75. Complicates overall health  4.History of GERD Was on PPI, not currently taking.    Discharge Diagnoses:  Principal Problem:   Choledocholithiasis    Discharge Instructions  Discharge Instructions    Call MD for:  persistant nausea and vomiting   Complete by: As directed    Call MD for:  severe uncontrolled pain   Complete by: As directed    Call MD for:  temperature >100.4   Complete by: As directed    Diet - low sodium heart healthy   Complete by: As directed    If the dressing is still on your incision site when you go home, remove it on the third day after your surgery date. Remove dressing if it begins to fall off, or if it is dirty or damaged before the third day.   Complete by: As directed    Increase activity slowly   Complete by: As directed      Allergies as of 07/03/2020   No Known Allergies     Medication List    STOP taking these medications   cyclobenzaprine 5 MG tablet Commonly known as: FLEXERIL   pantoprazole 40 MG tablet Commonly known as: Protonix     TAKE these medications   acetaminophen 500 MG tablet Commonly known as: TYLENOL Take 2 tablets (1,000 mg total) by mouth every 6 (six) hours as needed. What changed:   medication strength  how much to take  when to take this  reasons to take this   Blood Pressure Monitor Kit 1 kit by Does not apply route once a week. Check BP weekly.  Large Cuff. DX:  Z13.6  Z34.86   [place] as directed.   ferrous sulfate 325 (65 FE) MG tablet Take 1 tablet (325 mg total) by mouth every other day.   ibuprofen 200 MG tablet Commonly known as: ADVIL Take 600 mg by mouth every 6 (six) hours as needed for mild pain. What changed: Another medication with the same name was removed. Continue taking this medication, and follow the directions you see here.   norethindrone 0.35 MG  tablet Commonly known as: Ortho Micronor Take 1 tablet (0.35 mg total) by mouth daily.   omeprazole 20 MG capsule Commonly known as: PriLOSEC Take 1 capsule (20 mg total) by mouth daily.   oxyCODONE 5 MG immediate release tablet Commonly known as: Oxy IR/ROXICODONE Take 1 tablet (5 mg total) by mouth every 6 (six) hours as needed for moderate pain (pain not relieved by tylenol or ibuprofen).   sucralfate 1 g tablet Commonly known as: Carafate Take 1 tablet (1 g total) by mouth 3 (three) times daily as needed.            Discharge Care Instructions  (From admission, onward)         Start     Ordered   07/03/20 0000  If the dressing is still on your incision site when you go home, remove it on the third day after your surgery date. Remove dressing if it begins to fall off, or if it is dirty or damaged before the third day.        07/03/20 1029          Follow-up Information    [facility] Follow [facility].   Specialty: General Surgery Why: our office is scheduling you for post-operative follow up in 2-3 weeks. please call to confirm appointment date/time. Contact information: [place] [address] [phone]              No Known Allergies  Consultations:  GI, general surgery   Procedures/Studies: MR ABDOMEN MRCP WO CONTRAST  Result Date: 06/29/2020 CLINICAL DATA:  Jaundice EXAM: MRI ABDOMEN WITHOUT CONTRAST  (INCLUDING MRCP) TECHNIQUE: Multiplanar multisequence MR imaging of the abdomen was performed. Heavily T2-weighted images of the biliary and pancreatic ducts were obtained, and three-dimensional MRCP images were rendered by post processing. COMPARISON:  Same day abdominal ultrasound. FINDINGS: Limited sequences obtained due to patient's inability to complete the study secondary to panic attack. Lower chest: No acute findings. Hepatobiliary: No hepatic steatosis. No suspicious hepatic lesion. Numerous small cholelithiasis  in a distended gallbladder without wall thickening or pericholecystic fluid. There are multiple tiny round 2-3 mm filling defects within the common bile duct with extrahepatic and proximal intrahepatic biliary ductal dilation, with the common duct measuring 8 mm on image 20/6. Pancreas: No mass, inflammatory changes, or other parenchymal abnormality identified. Spleen:  Within normal limits in size and appearance. Adrenals/Urinary Tract: No masses identified. No evidence of hydronephrosis. Stomach/Bowel: Visualized portions within the abdomen are unremarkable. Vascular/Lymphatic: No pathologically enlarged lymph nodes identified. No abdominal aortic aneurysm demonstrated. Other:  None. Musculoskeletal: No suspicious bone lesions identified. IMPRESSION: Limited sequences obtained due to patient's inability to complete the study secondary to panic attack. Within this contracts: 1. Numerous tiny choledocholithiasis with mild extra and proximal intrahepatic biliary ductal dilation and the common duct measuring 8 mm. 2. Numerous small cholelithiasis in a distended gallbladder without wall thickening or pericholecystic fluid. Electronically Signed   By: Dahlia Bailiff MD   On:  06/29/2020 22:26   DG ERCP BILIARY & PANCREATIC DUCTS  Result Date: 07/01/2020 CLINICAL DATA:  ERCP for choledocholithiasis. EXAM: ERCP TECHNIQUE: Multiple spot images obtained with the fluoroscopic device and submitted for interpretation post-procedure. COMPARISON:  MRCP-06/29/2020 FLUOROSCOPY TIME:  21 minutes, 43 seconds (236 mGy) FINDINGS: 17 spot intraoperative fluoroscopic images the right upper abdominal quadrant during ERCP are provided for review Initial image demonstrates an ERCP probe overlying the right upper abdominal quadrant. Subsequent images demonstrate selective cannulation and eventual opacification of the pancreatic duct which appears nondilated. Subsequent images than demonstrate selective cannulation and opacification of  the common bile duct which also appears nondilated. There is inflation of a balloon within distal aspect of the CBD with subsequent biliary sweeping and presumed sphincterotomy. There is slight opacification of the gallbladder lumen without significant opacification of the cystic duct. There is minimal opacification of the intrahepatic biliary tree which appears nondilated. IMPRESSION: ERCP with biliary sweeping and presumed sphincterotomy as above. These images were submitted for radiologic interpretation only. Please see the procedural report for the amount of contrast and the fluoroscopy time utilized. Electronically Signed   By: Sandi Mariscal M.D.   On: 07/01/2020 19:51   US Abdomen Limited RUQ (LIVER/GB)  Result Date: 06/29/2020 CLINICAL DATA:  Right upper quadrant pain for 5 days. EXAM: ULTRASOUND ABDOMEN LIMITED RIGHT UPPER QUADRANT COMPARISON:  None. FINDINGS: Gallbladder: Stone and sludge filled gallbladder. No wall thickening or pericholecystic fluid. The technologist describes tenderness with gallbladder palpation. Common bile duct: Diameter: Mildly dilated at 8 mm. Possible central intrahepatic biliary duct dilatation Liver: Mildly increased in echogenicity. Portal vein is patent on color Doppler imaging with normal direction of blood flow towards the liver. Other: No ascites. Mild degradation secondary to patient body habitus. IMPRESSION: 1. Cholelithiasis. Although there is no wall thickening or pericholecystic fluid, the technologist describes tenderness with gallbladder palpation . Acute cholecystitis cannot be excluded. 2. Mild common duct and possible intrahepatic duct dilatation. If the bilirubin is elevated, choledocholithiasis would be a concern and further evaluation with MRCP or ERCP suggested. 3. Increased hepatic echogenicity, suggesting steatosis Electronically Signed   By: Abigail Miyamoto M.D.   On: 06/29/2020 19:27       Subjective:   Discharge Exam: Vitals:   07/03/20 0506  07/03/20 0930  BP: (!) 102/58 (!) 101/48  Pulse: (!) 50 (!) 52  Resp: 18 17  Temp: 98 F (36.7 C) 98.4 F (36.9 C)  SpO2: 100% 100%   Vitals:   07/02/20 1800 07/02/20 2324 07/03/20 0506 07/03/20 0930  BP: 110/62 (!) 102/51 (!) 102/58 (!) 101/48  Pulse: 67 (!) 59 (!) 50 (!) 52  Resp: _0 Temp: 98.6 F (37 C) 98.6 F (37 C) 98 F (36.7 C) 98.4 F (36.9 C)  TempSrc: Oral Oral Oral Oral  SpO2: 93% 100% 100% 100%  Weight:      Height:        General: Pt is alert, awake, not in acute distress Cardiovascular: RRR, S1/S2 +, no rubs, no gallops Respiratory: CTA bilaterally, no wheezing, no rhonchi Abdominal: Soft, NT, ND, bowel sounds + Extremities: no edema, no cyanosis    The results of significant diagnostics from this hospitalization (including imaging, microbiology, ancillary and laboratory) are listed below for reference.     Microbiology: Recent Results (from the past 240 hour(s))  Resp Panel by RT-PCR (Flu A&B, Covid) Nasopharyngeal Swab     Status: None   Collection Time: 06/30/20  2:03 AM  Specimen: Nasopharyngeal Swab; Nasopharyngeal(NP) swabs in vial transport medium  Result Value Ref Range Status   SARS Coronavirus 2 by RT PCR NEGATIVE NEGATIVE Final    Comment: (NOTE) SARS-CoV-2 target nucleic acids are NOT DETECTED.  The SARS-CoV-2 RNA is generally detectable in upper respiratory specimens during the acute phase of infection. The lowest concentration of SARS-CoV-2 viral copies this assay can detect is 138 copies/mL. A negative result does not preclude SARS-Cov-2 infection and should not be used as the sole basis for treatment or other patient management decisions. A negative result may occur with  improper specimen collection/handling, submission of specimen other than nasopharyngeal swab, presence of viral mutation(s) within the areas targeted by this assay, and inadequate number of viral copies(<138 copies/mL). A negative result must be  combined with clinical observations, patient history, and epidemiological information. The expected result is Negative.  Fact Sheet for Patients:  EntrepreneurPulse.com.au  Fact Sheet for Healthcare Providers:  IncredibleEmployment.be  This test is no t yet approved or cleared by the [place] FDA and  has been authorized for detection and/or diagnosis of SARS-CoV-2 by FDA under an Emergency Use Authorization (EUA). This EUA will remain  in effect (meaning this test can be used) for the duration of the COVID-19 declaration under Section 564(b)(1) of the Act, 21 U.S.C.section 360bbb-3(b)(1), unless the authorization is terminated  or revoked sooner.       Influenza A by PCR NEGATIVE NEGATIVE Final   Influenza B by PCR NEGATIVE NEGATIVE Final    Comment: (NOTE) The Xpert Xpress SARS-CoV-2/FLU/RSV plus assay is intended as an aid in the diagnosis of influenza from Nasopharyngeal swab specimens and should not be used as a sole basis for treatment. Nasal washings and aspirates are unacceptable for Xpert Xpress SARS-CoV-2/FLU/RSV testing.  Fact Sheet for Patients: EntrepreneurPulse.com.au  Fact Sheet for Healthcare Providers: IncredibleEmployment.be  This test is not yet approved or cleared by the [place] FDA and has been authorized for detection and/or diagnosis of SARS-CoV-2 by FDA under an Emergency Use Authorization (EUA). This EUA will remain in effect (meaning this test can be used) for the duration of the COVID-19 declaration under Section 564(b)(1) of the Act, 21 U.S.C. section 360bbb-3(b)(1), unless the authorization is terminated or revoked.  Performed at [facility], [place] [address], [place], [place] [address]      Labs: BNP (last 3 results) No results for input(s): BNP in the last 8760 hours. Basic Metabolic Panel: Recent Labs  Lab 06/29/20 1628 06/30/20 0412  07/01/20 0300 07/02/20 0453 07/03/20 0022  NA 135 136 139 138 137  K 3.6 4.0 4.1 4.2 4.0  CL 99 106 109 107 107  CO2 25 21* _0 GLUCOSE 107* 101* 92 108* 118*  BUN 8 8 <5* <5* 5*  CREATININE 0.82 0.66 0.63 0.57 0.69  CALCIUM 8.8* 8.5* 8.5* 8.7* 8.5*   Liver Function Tests: Recent Labs  Lab 06/29/20 1628 06/30/20 0412 07/01/20 0300 07/02/20 0453 07/03/20 0022  AST 292* 308* 125* 55* 58*  ALT 685* 631* 493* 342* 269*  ALKPHOS 152* 137* 134* 117 104  BILITOT 5.6* 4.7* 1.7* 1.1 0.9  PROT 7.0 6.1* 6.0* 6.1* 6.0*  ALBUMIN 3.8 3.3* 3.3* 3.2* 3.1*   Recent Labs  Lab 06/26/20 2101 06/28/20 1900 06/29/20 1628  LIPASE 34 31 42   No results for input(s): AMMONIA in the last 168 hours. CBC: Recent Labs  Lab 06/26/20 2101 06/28/20 1900 06/29/20 1628 06/30/20 0412 07/01/20 0300 07/02/20 5573  07/03/20 0022  WBC 12.3* 8.7 8.3 9.0 8.3 10.7* 12.8*  NEUTROABS 9.5* 6.0  --   --   --   --   --   HGB 13.6 13.4 13.6 12.2 11.9* 11.8* 11.1*  HCT 41.6 40.3 41.9 37.0 36.5 34.3* 33.8*  MCV 95.2 94.2 97.7 95.1 96.3 93.0 95.8  PLT 262 256 252 228 216 219 207   Cardiac Enzymes: No results for input(s): CKTOTAL, CKMB, CKMBINDEX, TROPONINI in the last 168 hours. BNP: Invalid input(s): POCBNP CBG: No results for input(s): GLUCAP in the last 168 hours. D-Dimer No results for input(s): DDIMER in the last 72 hours. Hgb A1c No results for input(s): HGBA1C in the last 72 hours. Lipid Profile No results for input(s): CHOL, HDL, LDLCALC, TRIG, CHOLHDL, LDLDIRECT in the last 72 hours. Thyroid function studies No results for input(s): TSH, T4TOTAL, T3FREE, THYROIDAB in the last 72 hours.  Invalid input(s): FREET3 Anemia work up No results for input(s): VITAMINB12, FOLATE, FERRITIN, TIBC, IRON, RETICCTPCT in the last 72 hours. Urinalysis    Component Value Date/Time   COLORURINE YELLOW 06/26/2020 2059   APPEARANCEUR HAZY (A) 06/26/2020 2059   LABSPEC 1.026 06/26/2020 2059    PHURINE 5.0 06/26/2020 2059   GLUCOSEU NEGATIVE 06/26/2020 2059   HGBUR NEGATIVE 06/26/2020 2059   BILIRUBINUR NEGATIVE 06/26/2020 2059   KETONESUR NEGATIVE 06/26/2020 2059   PROTEINUR NEGATIVE 06/26/2020 2059   NITRITE NEGATIVE 06/26/2020 2059   LEUKOCYTESUR NEGATIVE 06/26/2020 2059   Sepsis Labs Invalid input(s): PROCALCITONIN,  WBC,  LACTICIDVEN Microbiology Recent Results (from the past 240 hour(s))  Resp Panel by RT-PCR (Flu A&B, Covid) Nasopharyngeal Swab     Status: None   Collection Time: 06/30/20  2:03 AM   Specimen: Nasopharyngeal Swab; Nasopharyngeal(NP) swabs in vial transport medium  Result Value Ref Range Status   SARS Coronavirus 2 by RT PCR NEGATIVE NEGATIVE Final    Comment: (NOTE) SARS-CoV-2 target nucleic acids are NOT DETECTED.  The SARS-CoV-2 RNA is generally detectable in upper respiratory specimens during the acute phase of infection. The lowest concentration of SARS-CoV-2 viral copies this assay can detect is 138 copies/mL. A negative result does not preclude SARS-Cov-2 infection and should not be used as the sole basis for treatment or other patient management decisions. A negative result may occur with  improper specimen collection/handling, submission of specimen other than nasopharyngeal swab, presence of viral mutation(s) within the areas targeted by this assay, and inadequate number of viral copies(<138 copies/mL). A negative result must be combined with clinical observations, patient history, and epidemiological information. The expected result is Negative.  Fact Sheet for Patients:  EntrepreneurPulse.com.au  Fact Sheet for Healthcare Providers:  IncredibleEmployment.be  This test is no t yet approved or cleared by the [place] FDA and  has been authorized for detection and/or diagnosis of SARS-CoV-2 by FDA under an Emergency Use Authorization (EUA). This EUA will remain  in effect (meaning this test can  be used) for the duration of the COVID-19 declaration under Section 564(b)(1) of the Act, 21 U.S.C.section 360bbb-3(b)(1), unless the authorization is terminated  or revoked sooner.       Influenza A by PCR NEGATIVE NEGATIVE Final   Influenza B by PCR NEGATIVE NEGATIVE Final    Comment: (NOTE) The Xpert Xpress SARS-CoV-2/FLU/RSV plus assay is intended as an aid in the diagnosis of influenza from Nasopharyngeal swab specimens and should not be used as a sole basis for treatment. Nasal washings and aspirates are unacceptable for Xpert Xpress SARS-CoV-2/FLU/RSV testing.  Fact Sheet for Patients: EntrepreneurPulse.com.au  Fact Sheet for Healthcare Providers: IncredibleEmployment.be  This test is not yet approved or cleared by the [place] FDA and has been authorized for detection and/or diagnosis of SARS-CoV-2 by FDA under an Emergency Use Authorization (EUA). This EUA will remain in effect (meaning this test can be used) for the duration of the COVID-19 declaration under Section 564(b)(1) of the Act, 21 U.S.C. section 360bbb-3(b)(1), unless the authorization is terminated or revoked.  Performed at [facility], [place] [address], [place], [place] [address]      Time coordinating discharge: Over 30 minutes  SIGNED:   Nolberto Hanlon, MD  [facility] 07/03/2020, 10:35 AM Pager   If 7PM-7AM, please contact night-coverage [email or web address] Password [email or web address]

## 2020-07-03 NOTE — Anesthesia Postprocedure Evaluation (Signed)
Anesthesia Post Note  Patient: Connie Fisher  Procedure(s) Performed: LAPAROSCOPIC CHOLECYSTECTOMY (N/A )     Patient location during evaluation: PACU Anesthesia Type: General Level of consciousness: awake and alert Pain management: pain level controlled Vital Signs Assessment: post-procedure vital signs reviewed and stable Respiratory status: spontaneous breathing, nonlabored ventilation, respiratory function stable and patient connected to nasal cannula oxygen Cardiovascular status: blood pressure returned to baseline and stable Postop Assessment: no apparent nausea or vomiting Anesthetic complications: no   No complications documented.  Last Vitals:  Vitals:   07/02/20 2324 07/03/20 0506  BP: (!) 102/51 (!) 102/58  Pulse: (!) 59 (!) 50  Resp: 16 18  Temp: 37 C 36.7 C  SpO2: 100% 100%    Last Pain:  Vitals:   07/03/20 0552  TempSrc:   PainSc: 4                  Hatem Cull

## 2020-07-03 NOTE — Progress Notes (Signed)
[facility] Progress Note  1 Day Post-Op  Subjective: CC:  Sitting up in chair. Abdomen described as sore but improves with pain meds. Tolerating PO. Voiding. Reports one loose, non-bloody stool this AM. Mobilizing in her room.  Objective: Vital signs in last 24 hours: Temp:  [97.3 F (36.3 C)-98.6 F (37 C)] 98.4 F (36.9 C) (04/29 0930) Pulse Rate:  [50-79] 52 (04/29 0930) Resp:  [16-23] 17 (04/29 0930) BP: (101-111)/(48-62) 101/48 (04/29 0930) SpO2:  [93 %-100 %] 100 % (04/29 0930) Weight:  [140 kg] 140 kg (04/28 1029) Last BM Date: 07/02/20  Intake/Output from previous day: 04/28 0701 - 04/29 0700 In: 1560 [P.O.:360; I.V.:1200] Out: 5 [Blood:5] Intake/Output this shift: No intake/output data recorded.  PE: Gen:  Alert, NAD, pleasant Pulm:  Normal effort ORA Abd: Soft, appropriately tender, non-distended,  incisions C/D/I with skin glue/steris in place- no drainage or cellulitis.  Skin: warm and dry, no rashes  Psych: A&Ox3   Lab Results:  Recent Labs    07/02/20 0453 07/03/20 0022  WBC 10.7* 12.8*  HGB 11.8* 11.1*  HCT 34.3* 33.8*  PLT 219 207   BMET Recent Labs    07/02/20 0453 07/03/20 0022  NA 138 137  K 4.2 4.0  CL 107 107  CO2 24 26  GLUCOSE 108* 118*  BUN <5* 5*  CREATININE 0.57 0.69  CALCIUM 8.7* 8.5*   PT/INR No results for input(s): LABPROT, INR in the last 72 hours. CMP     Component Value Date/Time   NA 137 07/03/2020 0022   NA 137 09/10/2018 1405   K 4.0 07/03/2020 0022   CL 107 07/03/2020 0022   CO2 26 07/03/2020 0022   GLUCOSE 118 (H) 07/03/2020 0022   BUN 5 (L) 07/03/2020 0022   BUN 6 09/10/2018 1405   CREATININE 0.69 07/03/2020 0022   CALCIUM 8.5 (L) 07/03/2020 0022   PROT 6.0 (L) 07/03/2020 0022   PROT 6.8 09/10/2018 1405   ALBUMIN 3.1 (L) 07/03/2020 0022   ALBUMIN 4.2 09/10/2018 1405   AST 58 (H) 07/03/2020 0022   ALT 269 (H) 07/03/2020 0022   ALKPHOS 104 07/03/2020 0022   BILITOT 0.9 07/03/2020 0022    BILITOT <0.2 09/10/2018 1405   GFRNONAA >60 07/03/2020 0022   GFRAA >60 03/19/2019 1321   Lipase     Component Value Date/Time   LIPASE 42 06/29/2020 1628       Studies/Results: DG ERCP BILIARY & PANCREATIC DUCTS  Result Date: 07/01/2020 CLINICAL DATA:  ERCP for choledocholithiasis. EXAM: ERCP TECHNIQUE: Multiple spot images obtained with the fluoroscopic device and submitted for interpretation post-procedure. COMPARISON:  MRCP-06/29/2020 FLUOROSCOPY TIME:  21 minutes, 43 seconds (236 mGy) FINDINGS: 17 spot intraoperative fluoroscopic images the right upper abdominal quadrant during ERCP are provided for review Initial image demonstrates an ERCP probe overlying the right upper abdominal quadrant. Subsequent images demonstrate selective cannulation and eventual opacification of the pancreatic duct which appears nondilated. Subsequent images than demonstrate selective cannulation and opacification of the common bile duct which also appears nondilated. There is inflation of a balloon within distal aspect of the CBD with subsequent biliary sweeping and presumed sphincterotomy. There is slight opacification of the gallbladder lumen without significant opacification of the cystic duct. There is minimal opacification of the intrahepatic biliary tree which appears nondilated. IMPRESSION: ERCP with biliary sweeping and presumed sphincterotomy as above. These images were submitted for radiologic interpretation only. Please see the procedural report for the amount of contrast and the fluoroscopy  time utilized. Electronically Signed   By: Simonne Come M.D.   On: 07/01/2020 19:51    Anti-infectives: Anti-infectives (From admission, onward)   Start     Dose/Rate Route Frequency Ordered Stop   07/01/20 0800  Ampicillin-Sulbactam (UNASYN) 3 g in sodium chloride 0.9 % 100 mL IVPB        3 g 200 mL/hr over 30 Minutes Intravenous To Endoscopy 06/30/20 1419 07/01/20 1558   06/29/20 1945   piperacillin-tazobactam (ZOSYN) IVPB 3.375 g        3.375 g 100 mL/hr over 30 Minutes Intravenous  Once 06/29/20 1944 06/29/20 2033     Assessment/Plan Choledocholithiasis, symptomatic cholelithiasis  S/p ERCP 4/27 S/p laparoscopic cholecystectomy 07/02/20 Dr. Dwain Sarna  Pain controlled, tolerating PO, having bowel function, mobilizing safely in her room. Patient stable for discharge from [facility] perspective. Pain Rx and follow up provided. No role for continued abx from general surgery standpoint. Patient may call as needed.    LOS: 4 days    Hosie Spangle, [facility] Please see Amion for pager number during day hours 7:00am-4:30pm

## 2020-07-03 NOTE — Plan of Care (Signed)

## 2020-07-05 DIAGNOSIS — Z419 Encounter for procedure for purposes other than remedying health state, unspecified: Secondary | ICD-10-CM | POA: Diagnosis not present

## 2020-07-06 ENCOUNTER — Telehealth: Payer: Self-pay

## 2020-07-06 NOTE — Telephone Encounter (Signed)
Transition Care Management Unsuccessful Follow-up Telephone Call  Date of discharge and from where:  07/03/2020 from   Attempts:  1st Attempt  Reason for unsuccessful TCM follow-up call:  Left voice message     

## 2020-07-07 NOTE — Telephone Encounter (Signed)
Transition Care Management Unsuccessful Follow-up Telephone Call  Date of discharge and from where:  04/219/2022 from [facility]  Attempts:  2nd Attempt  Reason for unsuccessful TCM follow-up call:  Left voice message

## 2020-07-08 NOTE — Telephone Encounter (Signed)
Transition Care Management Unsuccessful Follow-up Telephone Call  Date of discharge and from where:  07/03/2020 from [facility]  Attempts:  3rd Attempt  Reason for unsuccessful TCM follow-up call:  Unable to reach patient     

## 2020-08-04 ENCOUNTER — Ambulatory Visit: Payer: Medicaid Other | Admitting: Internal Medicine

## 2020-08-05 DIAGNOSIS — Z419 Encounter for procedure for purposes other than remedying health state, unspecified: Secondary | ICD-10-CM | POA: Diagnosis not present

## 2020-09-04 DIAGNOSIS — Z419 Encounter for procedure for purposes other than remedying health state, unspecified: Secondary | ICD-10-CM | POA: Diagnosis not present

## 2020-10-05 DIAGNOSIS — Z419 Encounter for procedure for purposes other than remedying health state, unspecified: Secondary | ICD-10-CM | POA: Diagnosis not present

## 2020-10-06 ENCOUNTER — Ambulatory Visit: Payer: Medicaid Other | Admitting: *Deleted

## 2020-10-06 ENCOUNTER — Other Ambulatory Visit: Payer: Self-pay

## 2020-10-06 DIAGNOSIS — Z3201 Encounter for pregnancy test, result positive: Secondary | ICD-10-CM

## 2020-10-06 NOTE — Progress Notes (Signed)
Connie Fisher presents today for UPT. She has no unusual complaints. LMP:    OBJECTIVE: Appears well, in no apparent distress.  OB History     Gravida  1   Para  1   Term  1   Preterm      AB      Living  1      SAB      IAB      Ectopic      Multiple  0   Live Births  1          Home UPT Result: positive In-Office UPT result:positive I have reviewed the patient's medical, obstetrical, social, and family histories, and medications.   ASSESSMENT: Positive pregnancy test  PLAN Prenatal care to be completed at:  [facility]

## 2020-11-03 ENCOUNTER — Ambulatory Visit (INDEPENDENT_AMBULATORY_CARE_PROVIDER_SITE_OTHER): Payer: Medicaid Other

## 2020-11-03 ENCOUNTER — Other Ambulatory Visit: Payer: Self-pay

## 2020-11-03 VITALS — BP 118/78 | HR 84 | Ht 63.0 in | Wt 266.9 lb

## 2020-11-03 DIAGNOSIS — Z3491 Encounter for supervision of normal pregnancy, unspecified, first trimester: Secondary | ICD-10-CM | POA: Insufficient documentation

## 2020-11-03 DIAGNOSIS — Z3481 Encounter for supervision of other normal pregnancy, first trimester: Secondary | ICD-10-CM

## 2020-11-03 DIAGNOSIS — O3680X0 Pregnancy with inconclusive fetal viability, not applicable or unspecified: Secondary | ICD-10-CM

## 2020-11-03 DIAGNOSIS — Z3A09 9 weeks gestation of pregnancy: Secondary | ICD-10-CM | POA: Diagnosis not present

## 2020-11-03 DIAGNOSIS — Z3480 Encounter for supervision of other normal pregnancy, unspecified trimester: Secondary | ICD-10-CM | POA: Insufficient documentation

## 2020-11-03 DIAGNOSIS — O0990 Supervision of high risk pregnancy, unspecified, unspecified trimester: Secondary | ICD-10-CM | POA: Insufficient documentation

## 2020-11-03 DIAGNOSIS — O219 Vomiting of pregnancy, unspecified: Secondary | ICD-10-CM

## 2020-11-03 MED ORDER — DICLEGIS 10-10 MG PO TBEC: 2.0000 | DELAYED_RELEASE_TABLET | Freq: Every day | ORAL | 5 refills | Status: DC

## 2020-11-03 MED ORDER — GOJJI WEIGHT SCALE MISC: 1.0000 | 0 refills | Status: DC

## 2020-11-03 MED ORDER — BLOOD PRESSURE KIT DEVI: 1.0000 | 0 refills | Status: DC

## 2020-11-03 NOTE — Progress Notes (Signed)
Patient was assessed and managed by nursing staff during this encounter. I have reviewed the chart and agree with the documentation and plan. I have also made any necessary editorial changes.  Catalina Antigua, MD 11/03/2020 4:35 PM

## 2020-11-03 NOTE — Progress Notes (Signed)
[facility]  I connected with  Connie Fisher on 11/03/20 at 10:15 AM EDT by in person Video Visit and verified that I am speaking with the correct person using two identifiers. Nurse is located at [facility] and pt is located at [place].  I discussed the limitations, risks, security and privacy concerns of performing an evaluation and management service by telephone and the availability of in person appointments. I also discussed with the patient that there may be a patient responsible charge related to this service. The patient expressed understanding and agreed to proceed.  I explained I am completing [facility] today. We discussed her EDD of 06/08/21 that is based on LMP of 09/01/20. Pt is G2/P1001. I reviewed her allergies, medications, Medical/Surgical/OB history, and appropriate screenings. I informed her of [facility] services. Based on history, this is a/an  pregnancy uncomplicated .   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   MyChart/Babyscripts MyChart access verified. I explained pt will have some visits in office and some virtually. Babyscripts instructions given and order placed. Patient verifies receipt of registration text/e-mail. Account successfully created and app downloaded.  Blood Pressure Cuff  Blood pressure cuff ordered for patient to pick-up from [employer]. Explained after first prenatal appt pt will check weekly and document in Babyscripts.  Weight scale: Patient does not have weight scale. Weight scale ordered for patient to pick up form [facility].   Anatomy US Explained first scheduled [place]  will be around 19 weeks. Dating and viability scan performed today  Labs Discussed Avelina Laine genetic screening with patient. Would like both Panorama and Horizon drawn at [facility] visit. Routine prenatal labs needed.  Covid Vaccine Patient has not covid vaccine.   Mother/ Baby Dyad Candidate?    If yes, offer as possibility  Informed patient of [facility] website  and placed link in her AVS.   Social Determinants of Health Food Insecurity: Patient denies food insecurity. WIC Referral: Patient is interested in referral to [facility].  Transportation: Patient denies transportation needs. Childcare: Discussed no children allowed at ultrasound appointments. Offered childcare services; patient declines childcare services at this time.  Send link to Pregnancy Navigators   Placed OB Box on problem list and updated  First visit review I reviewed [facility] appt with pt. I explained she will have a pelvic exam, ob bloodwork with genetic screening, and PAP smear. Explained pt will be seen by Sharen Counter at first visit; encounter routed to appropriate provider. Explained that patient will be seen by pregnancy navigator following visit with provider. [facility] information placed in AVS.   Hamilton Capri, RN 11/03/2020  10:00 AM

## 2020-11-05 DIAGNOSIS — Z419 Encounter for procedure for purposes other than remedying health state, unspecified: Secondary | ICD-10-CM | POA: Diagnosis not present

## 2020-11-16 ENCOUNTER — Other Ambulatory Visit: Payer: Self-pay

## 2020-11-16 ENCOUNTER — Other Ambulatory Visit (HOSPITAL_COMMUNITY)
Admission: RE | Admit: 2020-11-16 | Discharge: 2020-11-16 | Disposition: A | Discharge status: Home / Self Care | Payer: Medicaid Other | Source: Ambulatory Visit | Attending: Advanced Practice Midwife | Admitting: Advanced Practice Midwife

## 2020-11-16 ENCOUNTER — Ambulatory Visit (INDEPENDENT_AMBULATORY_CARE_PROVIDER_SITE_OTHER): Payer: Medicaid Other | Admitting: Advanced Practice Midwife

## 2020-11-16 VITALS — BP 120/77 | HR 87 | Wt 268.2 lb

## 2020-11-16 DIAGNOSIS — O99210 Obesity complicating pregnancy, unspecified trimester: Secondary | ICD-10-CM

## 2020-11-16 DIAGNOSIS — Z3481 Encounter for supervision of other normal pregnancy, first trimester: Secondary | ICD-10-CM

## 2020-11-16 DIAGNOSIS — O99211 Obesity complicating pregnancy, first trimester: Secondary | ICD-10-CM

## 2020-11-16 DIAGNOSIS — O26891 Other specified pregnancy related conditions, first trimester: Secondary | ICD-10-CM

## 2020-11-16 DIAGNOSIS — Z3A10 10 weeks gestation of pregnancy: Secondary | ICD-10-CM | POA: Diagnosis not present

## 2020-11-16 DIAGNOSIS — R12 Heartburn: Secondary | ICD-10-CM

## 2020-11-16 MED ORDER — FAMOTIDINE 20 MG PO TABS
20.0000 mg | ORAL_TABLET | Freq: Two times a day (BID) | ORAL | 2 refills | Status: DC
Start: 2020-11-16 — End: 2021-01-18

## 2020-11-16 NOTE — Progress Notes (Signed)
Subjective:   Connie Fisher is a 25 y.o. G2P1001 at [redacted]w[redacted]d by LMP being seen today for her first obstetrical visit.  Her obstetrical history is significant for  previous term vaginal delivery  and has Maternal morbid obesity, antepartum ([place]); Rh negative state in antepartum period; Genetic carrier status; Labor and delivery, indication for care; Choledocholithiasis; and Encounter for supervision of normal pregnancy in first trimester on their problem list.. Patient does intend to breast feed. Pregnancy history fully reviewed.  Patient reports heartburn.  HISTORY: OB History  Gravida Para Term Preterm AB Living  $Remov'2 1 1 'kypqqx$ 0 0 1  SAB IAB Ectopic Multiple Live Births  0 0 0 0 1    # Outcome Date GA Lbr Len/2nd Weight Sex Delivery Anes PTL Lv  2 Current           1 Term 03/24/19 [redacted]w[redacted]d 12:17 / 04:00 7 lb 7.9 oz (3.399 kg) F Vag-Spont EPI  LIV     Birth Comments: WNL     Name: Connie Fisher     Apgar1: 9  Apgar5: 9   Past Medical History:  Diagnosis Date   Heart murmur    AS A BABY   Morbid obesity ([place])    Past Surgical History:  Procedure Laterality Date   CHOLECYSTECTOMY N/A 07/02/2020   Procedure: LAPAROSCOPIC CHOLECYSTECTOMY;  Surgeon: Rolm Bookbinder, MD;  Location: [place];  Service: General;  Laterality: N/A;   ENDOSCOPIC RETROGRADE CHOLANGIOPANCREATOGRAPHY (ERCP) WITH PROPOFOL N/A 07/01/2020   Procedure: ENDOSCOPIC RETROGRADE CHOLANGIOPANCREATOGRAPHY (ERCP) WITH PROPOFOL;  Surgeon: Irene Shipper, MD;  Location[facility] ENDOSCOPY;  Service: Endoscopy;  Laterality: N/A;   NO PAST SURGERIES     REMOVAL OF STONES  07/01/2020   Procedure: REMOVAL OF STONES;  Surgeon: Irene Shipper, MD;  Location: [facility] ENDOSCOPY;  Service: Endoscopy;;   SPHINCTEROTOMY  07/01/2020   Procedure: Joan Mayans;  Surgeon: Irene Shipper, MD;  Location: [facility] ENDOSCOPY;  Service: Endoscopy;;   Family History  Problem Relation Age of Onset   Diabetes Paternal Grandmother    Cancer Maternal Grandmother     Diabetes Father    Deep vein thrombosis Mother    Kidney Stones Mother    Asthma Brother    Seizures Brother    Arthritis Paternal Uncle    Cancer Paternal Uncle    Social History   Tobacco Use   Smoking status: Never   Smokeless tobacco: Never  Vaping Use   Vaping Use: Never used  Substance Use Topics   Alcohol use: Not Currently    Comment: occ, events, not since confirmed pregnancy   Drug use: Never   No Known Allergies Current Outpatient Medications on File Prior to Visit  Medication Sig Dispense Refill   acetaminophen (TYLENOL) 500 MG tablet Take 2 tablets (1,000 mg total) by mouth every 6 (six) hours as needed. 30 tablet 0   Blood Pressure Monitoring (BLOOD PRESSURE KIT) DEVI 1 kit by Does not apply route once a week. 1 each 0   DICLEGIS 10-10 MG TBEC Take 2 tablets by mouth at bedtime. If symptoms persist, add one tablet in the morning and one in the afternoon 100 tablet 5   Misc. Devices (GOJJI WEIGHT SCALE) MISC 1 Device by Does not apply route every 30 (thirty) days. 1 each 0   Prenatal MV & Min w/FA-DHA (PRENATAL GUMMIES PO) Take by mouth.     No current facility-administered medications on file prior to visit.     Indications for  ASA therapy (per uptodate) One of the following: Previous pregnancy with preeclampsia, especially early onset and with an adverse outcome No Multifetal gestation No Chronic hypertension No Type 1 or 2 diabetes mellitus No Chronic kidney disease No Autoimmune disease (antiphospholipid syndrome, systemic lupus erythematosus) No   Two or more of the following: Nulliparity No Obesity (body mass index >30 kg/m2) Yes Family history of preeclampsia in mother or sister No Age ?35 years No Sociodemographic characteristics (African American race, low socioeconomic level) No Personal risk factors (eg, previous pregnancy with low birth weight or small for gestational age infant, previous adverse pregnancy outcome [eg, stillbirth], interval >10  years between pregnancies) No   Indications for early 1 hour GTT (per uptodate)  BMI >25 (>23 in Asian women) AND one of the following  Gestational diabetes mellitus in a previous pregnancy No Glycated hemoglobin ?5.7 percent (39 mmol/mol), impaired glucose tolerance, or impaired fasting glucose on previous testing No First-degree relative with diabetes No High-risk race/ethnicity (eg, African American, Latino, Native American, [place] American, Pacific Islander) Yes History of cardiovascular disease No Hypertension or on therapy for hypertension No High-density lipoprotein cholesterol level <35 mg/dL (0.90 mmol/L) and/or a triglyceride level >250 mg/dL (2.82 mmol/L) No Polycystic ovary syndrome No Physical inactivity No Other clinical condition associated with insulin resistance (eg, severe obesity, acanthosis nigricans) No Previous birth of an infant weighing ?4000 g No Previous stillbirth of unknown cause No Exam   Vitals:   11/16/20 1020  BP: 120/77  Pulse: 87  Weight: 268 lb 3.2 oz (121.7 kg)   Fetal Heart Rate (bpm): 162  VS reviewed, nursing note reviewed,  Constitutional: well developed, well nourished, no distress HEENT: normocephalic CV: normal rate Pulm/chest wall: normal effort Abdomen: soft Neuro: alert and oriented x 3 Skin: warm, dry Psych: affect normal    Assessment:   Pregnancy: G2P1001 Patient Active Problem List   Diagnosis Date Noted   Encounter for supervision of normal pregnancy in first trimester 11/03/2020   Choledocholithiasis 06/29/2020   Labor and delivery, indication for care 03/23/2019   Genetic carrier status 09/24/2018   Rh negative state in antepartum period 09/12/2018   Maternal morbid obesity, antepartum ([place]) 09/10/2018     Plan:  1. [redacted] weeks gestation of pregnancy   2. Encounter for supervision of other normal pregnancy in first trimester --Anticipatory guidance about next visits/weeks of pregnancy given. --next visit in 4  weeks --Pap wnl in 2020, due in 2023, consider postpartum  - Cervicovaginal ancillary only( [place]) - Genetic Screening - Obstetric Panel, Including HIV - Hepatitis C antibody - Culture, OB Urine - Hemoglobin A1c  3. Heartburn during pregnancy in first trimester --Discussed dietary changes  - famotidine (PEPCID) 20 MG tablet; Take 1 tablet (20 mg total) by mouth 2 (two) times daily.  Dispense: 30 tablet; Refill: 2     Initial labs drawn. Continue prenatal vitamins. Discussed and offered genetic screening options, including Quad screen/AFP, NIPS testing, and option to decline testing. Benefits/risks/alternatives reviewed. Pt aware that anatomy US is form of genetic screening with lower accuracy in detecting trisomies than blood work.  Pt chooses genetic screening today. NIPS: ordered. Ultrasound discussed; fetal anatomic survey: requested. Problem list reviewed and updated. The nature of [place] with multiple MDs and other Advanced Practice Providers was explained to patient; also emphasized that residents, students are part of our team. Routine obstetric precautions reviewed. Return in about 4 weeks (around 12/14/2020).   Fatima Blank, CNM 11/17/20  1:01 PM

## 2020-11-17 LAB — OBSTETRIC PANEL, INCLUDING HIV
Antibody Screen: NEGATIVE
Basophils Absolute: 0 10*3/uL (ref 0.0–0.2)
Basos: 0 %
EOS (ABSOLUTE): 0.2 10*3/uL (ref 0.0–0.4)
Eos: 2 %
HIV Screen 4th Generation wRfx: NONREACTIVE
Hematocrit: 36.1 % (ref 34.0–46.6)
Hemoglobin: 12.6 g/dL (ref 11.1–15.9)
Hepatitis B Surface Ag: NEGATIVE
Immature Grans (Abs): 0 10*3/uL (ref 0.0–0.1)
Immature Granulocytes: 0 %
Lymphocytes Absolute: 1.9 10*3/uL (ref 0.7–3.1)
Lymphs: 23 %
MCH: 31 pg (ref 26.6–33.0)
MCHC: 34.9 g/dL (ref 31.5–35.7)
MCV: 89 fL (ref 79–97)
Monocytes Absolute: 0.7 10*3/uL (ref 0.1–0.9)
Monocytes: 8 %
Neutrophils Absolute: 5.6 10*3/uL (ref 1.4–7.0)
Neutrophils: 67 %
Platelets: 289 10*3/uL (ref 150–450)
RBC: 4.07 x10E6/uL (ref 3.77–5.28)
RDW: 11.2 % — ABNORMAL LOW (ref 11.7–15.4)
RPR Ser Ql: NONREACTIVE
Rh Factor: NEGATIVE
Rubella Antibodies, IGG: 7.19 index (ref >0.99)
WBC: 8.3 10*3/uL (ref 3.4–10.8)

## 2020-11-17 LAB — CERVICOVAGINAL ANCILLARY ONLY
Chlamydia: NEGATIVE
Comment: NEGATIVE
Comment: NORMAL
Neisseria Gonorrhea: NEGATIVE

## 2020-11-17 LAB — HEMOGLOBIN A1C
Est. average glucose Bld gHb Est-mCnc: 114 mg/dL
Hgb A1c MFr Bld: 5.6 % (ref 4.8–5.6)

## 2020-11-17 LAB — HEPATITIS C ANTIBODY: Hep C Virus Ab: <0.1 s/co ratio (ref 0.0–0.9)

## 2020-11-17 MED ORDER — ASPIRIN EC 81 MG PO TBEC: 81.0000 mg | DELAYED_RELEASE_TABLET | Freq: Every day | ORAL | 11 refills | Status: DC

## 2020-11-18 LAB — URINE CULTURE, OB REFLEX

## 2020-11-18 LAB — CULTURE, OB URINE

## 2020-12-01 IMAGING — US OBSTETRIC <14 WK US AND TRANSVAGINAL OB US
1 series · 15 of 28 positions shown · non-contrast
Comparison: None.

CLINICAL DATA: Pelvic pain and bleeding.

EXAM:
OBSTETRIC <14 WK US AND TRANSVAGINAL OB US
TECHNIQUE: Both transabdominal and transvaginal ultrasound examinations were
performed for complete evaluation of the gestation as well as the
maternal uterus, adnexal regions, and pelvic cul-de-sac.
Transvaginal technique was performed to assess early pregnancy.

[Series 1: obstetric <14 wk us and transvaginal ob us · 15 of 63 slices shown]
[im 1/63]
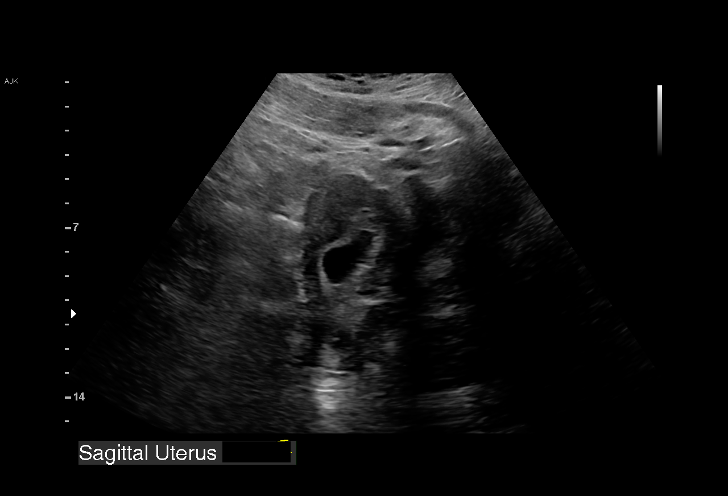
[im 5/63]
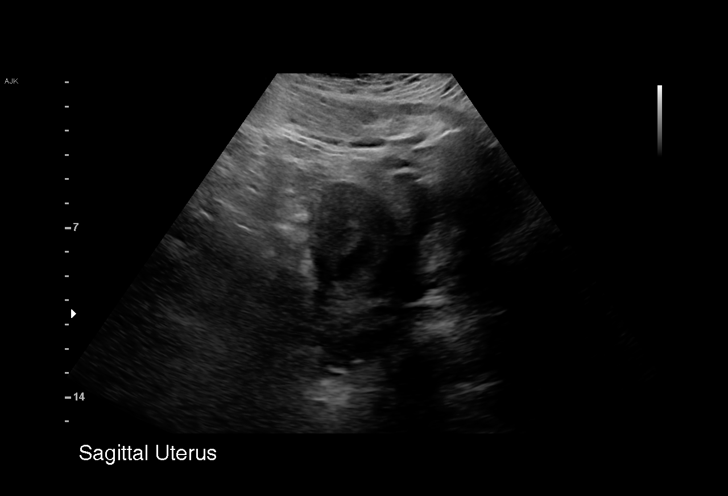
[im 10/63]
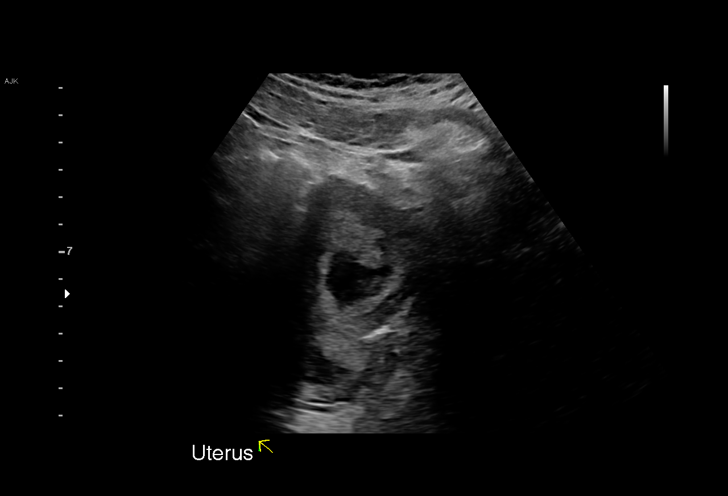
[im 14/63]
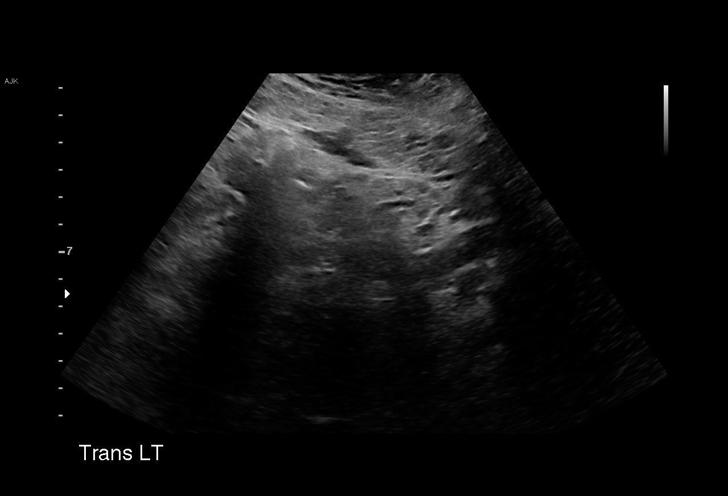
[im 19/63]
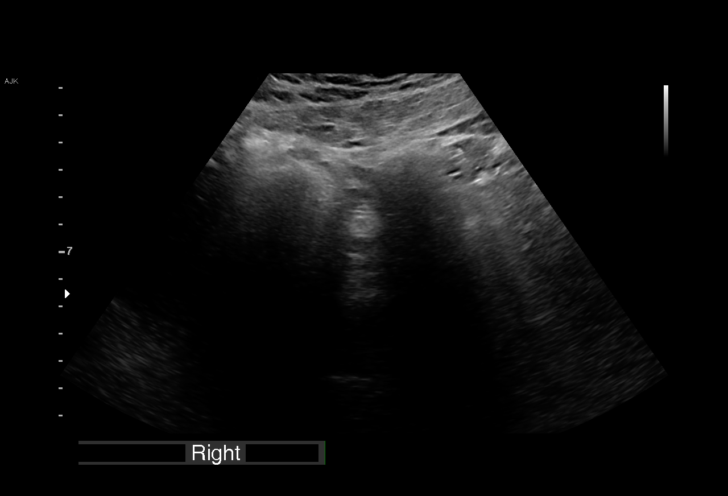
[im 23/63]
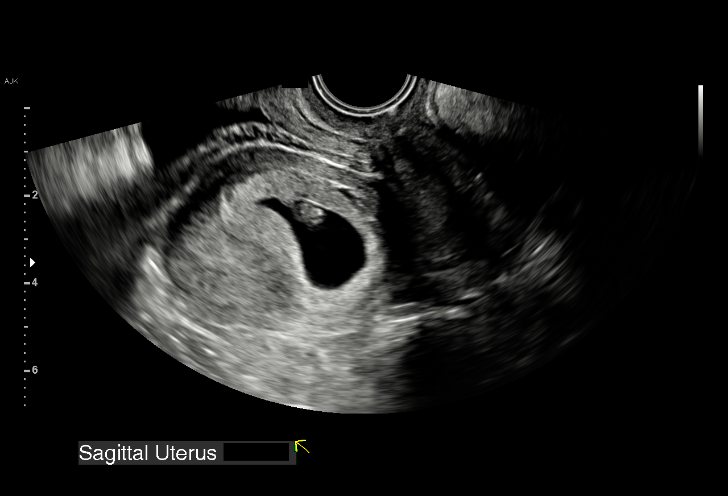
[im 28/63]
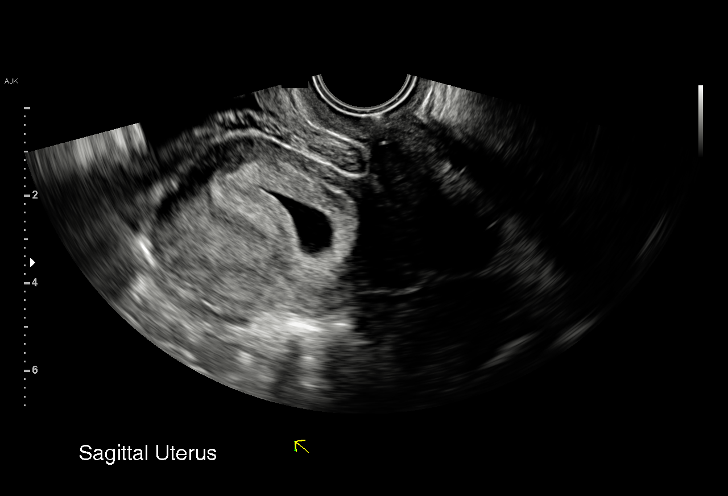
[im 33/63]
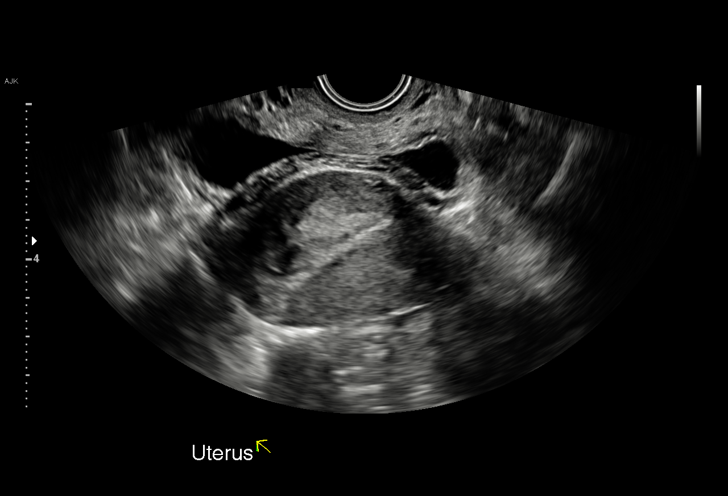
[im 35/63]
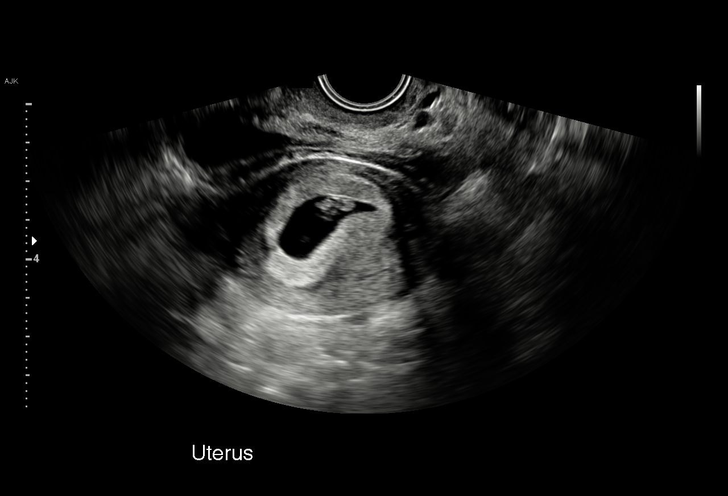
[im 40/63]
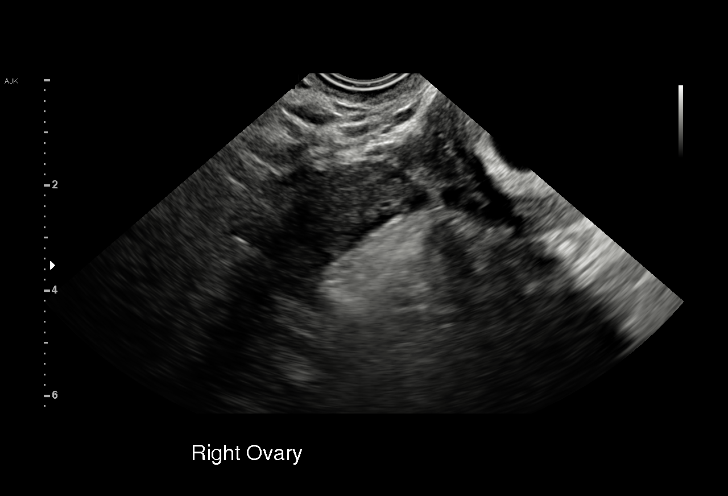
[im 44/63]
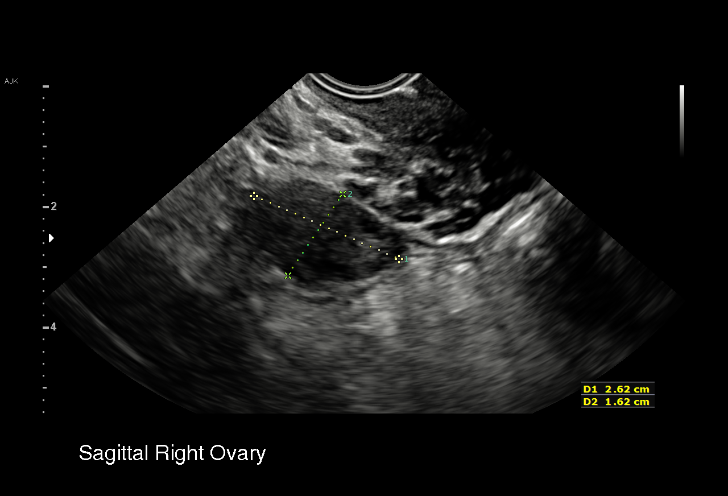
[im 49/63]
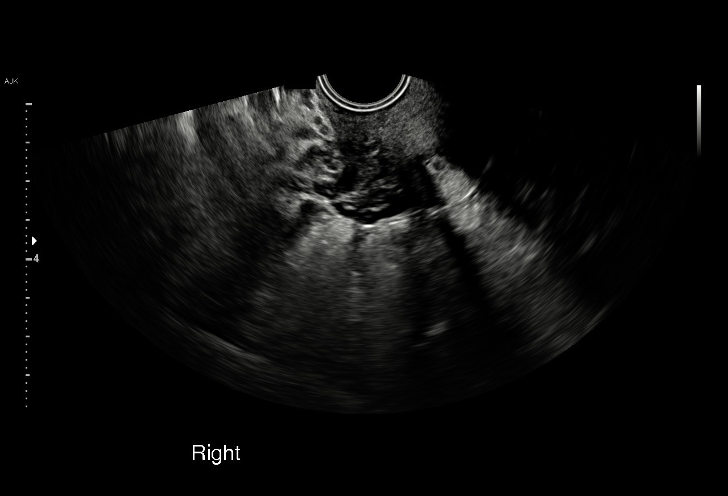
[im 53/63]
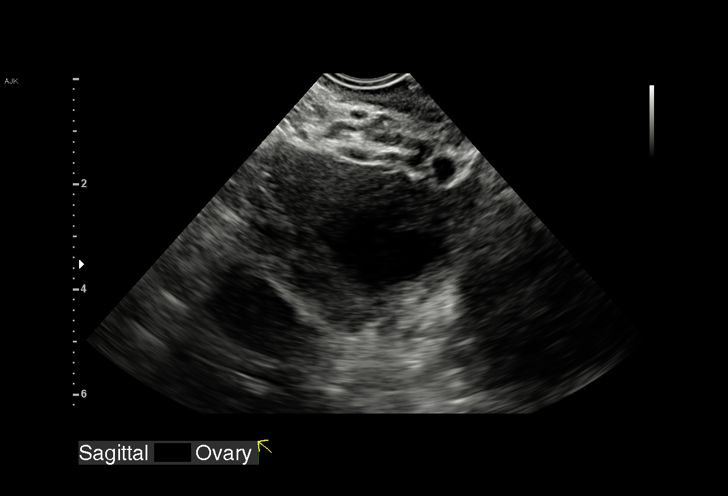
[im 58/63]
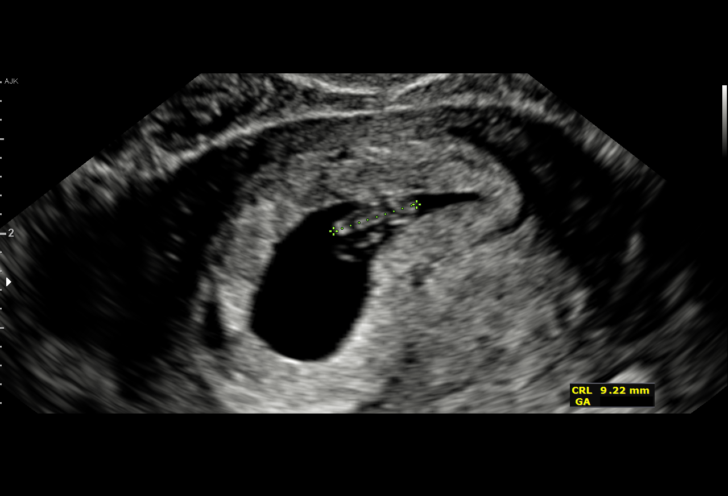
[im 63/63]
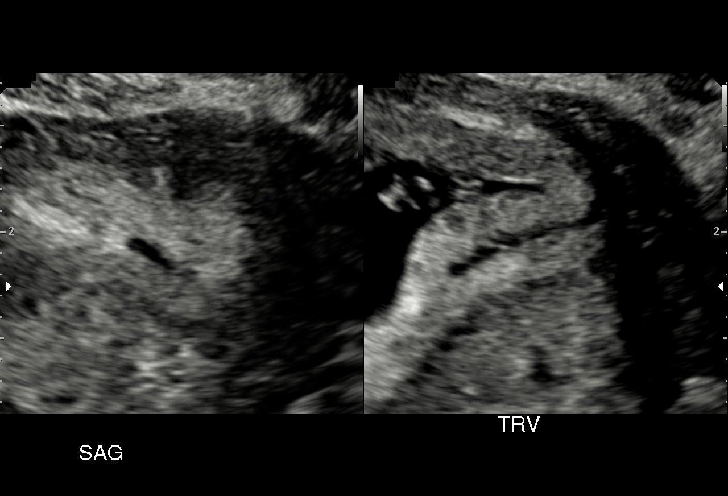

[15 of 28 positions shown; findings below may reference images not displayed]

FINDINGS: Intrauterine gestational sac: Single

Yolk sac:  Visualized.

Embryo:  Visualized.

Cardiac Activity: Visualized.

Heart Rate: 133 bpm

CRL:  9.4 mm   6 w   6 d                  US EDC: 04/03/2019

Subchorionic hemorrhage: There appears to be a trace amount of
subchorionic hemorrhage.

Maternal uterus/adnexae: The ovaries are unremarkable. The right
ovary measures 2.6 x 1.6 x 2.6 cm. The left ovary measures 4.2 x
x 2.4 cm and appears to contain a corpus luteal cyst. There is no
significant free fluid in the pelvis.
IMPRESSION: Single live IUP at 6 weeks and 6 days. No evidence of a maternal
complication.

## 2020-12-05 DIAGNOSIS — Z419 Encounter for procedure for purposes other than remedying health state, unspecified: Secondary | ICD-10-CM | POA: Diagnosis not present

## 2020-12-14 ENCOUNTER — Other Ambulatory Visit: Payer: Self-pay

## 2020-12-14 ENCOUNTER — Encounter: Payer: Self-pay | Admitting: Obstetrics and Gynecology

## 2020-12-14 ENCOUNTER — Ambulatory Visit (INDEPENDENT_AMBULATORY_CARE_PROVIDER_SITE_OTHER): Payer: Medicaid Other | Admitting: Obstetrics and Gynecology

## 2020-12-14 VITALS — BP 121/78 | HR 89 | Wt 276.0 lb

## 2020-12-14 DIAGNOSIS — O99210 Obesity complicating pregnancy, unspecified trimester: Secondary | ICD-10-CM | POA: Diagnosis not present

## 2020-12-14 DIAGNOSIS — O26899 Other specified pregnancy related conditions, unspecified trimester: Secondary | ICD-10-CM

## 2020-12-14 DIAGNOSIS — Z3481 Encounter for supervision of other normal pregnancy, first trimester: Secondary | ICD-10-CM

## 2020-12-14 DIAGNOSIS — Z148 Genetic carrier of other disease: Secondary | ICD-10-CM

## 2020-12-14 DIAGNOSIS — Z6791 Unspecified blood type, Rh negative: Secondary | ICD-10-CM

## 2020-12-14 NOTE — Progress Notes (Signed)
Subjective:  Connie Fisher is a 25 y.o. G2P1001 at 40w6dbeing seen today for ongoing prenatal care.  She is currently monitored for the following issues for this high-risk pregnancy and has M[place] morbid [place], [place]; Rh negative state in antepartum period; Genetic carrier status; and Encounter for supervision of normal pregnancy in first trimester on their problem list.  Patient reports no complaints.  Contractions: Not present. Vag. Bleeding: None.   . Denies leaking of fluid.   The following portions of the patient's history were reviewed and updated as appropriate: allergies, current medications, past family history, past medical history, past social history, past surgical history and problem list. Problem list updated.  Objective:   Vitals:   12/14/20 1026  BP: 121/78  Pulse: 89  Weight: 276 lb (125.2 kg)    Fetal Status: Fetal Heart Rate (bpm): 151         General:  Alert, oriented and cooperative. Patient is in no acute distress.  Skin: Skin is warm and dry. No rash noted.   Cardiovascular: Normal heart rate noted  Respiratory: Normal respiratory effort, no problems with respiration noted  Abdomen: Soft, gravid, appropriate for gestational age. Pain/Pressure: Absent     Pelvic:  Cervical exam deferred        Extremities: Normal range of motion.  Edema: None  Mental Status: Normal mood and affect. Normal behavior. Normal judgment and thought content.   Urinalysis:      Assessment and Plan:  Pregnancy: G2P1001 at 175w6d1. Encounter for supervision of other normal pregnancy in first trimester Stable - USK[place]FM OB COMP + 14 WK; Future  2. Rh negative state in antepartum period Rhogam as indicated  3. Genetic carrier status Stable Recommended to have FOB tested  4. Maternal morbid obesity, antepartum (HCC) Stable, continue with qd BASA - TSH - Protein / creatinine ratio, urine - Comp Met (CMET)  Preterm labor symptoms and general obstetric  precautions including but not limited to vaginal bleeding, contractions, leaking of fluid and fetal movement were reviewed in detail with the patient. Please refer to After Visit Summary for other counseling recommendations.  Return in about 4 weeks (around 01/11/2021) for OB visit, face to face, any provider.   ErChancy MilroyMD

## 2020-12-14 NOTE — Patient Instructions (Signed)

## 2020-12-16 LAB — COMPREHENSIVE METABOLIC PANEL
ALT: 8 IU/L (ref 0–32)
AST: 11 IU/L (ref 0–40)
Albumin/Globulin Ratio: 1.5 (ref 1.2–2.2)
Albumin: 3.8 g/dL — ABNORMAL LOW (ref 3.9–5.0)
Alkaline Phosphatase: 68 IU/L (ref 44–121)
BUN/Creatinine Ratio: 10 (ref 9–23)
BUN: 5 mg/dL — ABNORMAL LOW (ref 6–20)
Bilirubin Total: <0.2 mg/dL (ref 0.0–1.2)
CO2: 21 mmol/L (ref 20–29)
Calcium: 8.9 mg/dL (ref 8.7–10.2)
Chloride: 104 mmol/L (ref 96–106)
Creatinine, Ser: 0.52 mg/dL — ABNORMAL LOW (ref 0.57–1.00)
Globulin, Total: 2.6 g/dL (ref 1.5–4.5)
Glucose: 89 mg/dL (ref 70–99)
Potassium: 4.1 mmol/L (ref 3.5–5.2)
Sodium: 139 mmol/L (ref 134–144)
Total Protein: 6.4 g/dL (ref 6.0–8.5)
eGFR: 132 mL/min/{1.73_m2} (ref >59)

## 2020-12-16 LAB — PROTEIN / CREATININE RATIO, URINE
Creatinine, Urine: 244 mg/dL
Protein, Ur: 26 mg/dL
Protein/Creat Ratio: 107 mg/g creat (ref 0–200)

## 2020-12-16 LAB — TSH: TSH: 0.905 u[IU]/mL (ref 0.450–4.500)

## 2020-12-21 ENCOUNTER — Encounter: Payer: Self-pay | Admitting: Obstetrics and Gynecology

## 2021-01-05 DIAGNOSIS — Z419 Encounter for procedure for purposes other than remedying health state, unspecified: Secondary | ICD-10-CM | POA: Diagnosis not present

## 2021-01-11 ENCOUNTER — Other Ambulatory Visit: Payer: Self-pay

## 2021-01-11 ENCOUNTER — Ambulatory Visit (INDEPENDENT_AMBULATORY_CARE_PROVIDER_SITE_OTHER): Payer: Medicaid Other | Admitting: Advanced Practice Midwife

## 2021-01-11 ENCOUNTER — Encounter: Payer: Self-pay | Admitting: Advanced Practice Midwife

## 2021-01-11 VITALS — BP 136/88 | HR 109 | Wt 280.4 lb

## 2021-01-11 DIAGNOSIS — Z6791 Unspecified blood type, Rh negative: Secondary | ICD-10-CM

## 2021-01-11 DIAGNOSIS — Z3480 Encounter for supervision of other normal pregnancy, unspecified trimester: Secondary | ICD-10-CM | POA: Diagnosis not present

## 2021-01-11 DIAGNOSIS — M549 Dorsalgia, unspecified: Secondary | ICD-10-CM

## 2021-01-11 DIAGNOSIS — O99891 Other specified diseases and conditions complicating pregnancy: Secondary | ICD-10-CM

## 2021-01-11 DIAGNOSIS — O26899 Other specified pregnancy related conditions, unspecified trimester: Secondary | ICD-10-CM

## 2021-01-11 DIAGNOSIS — R102 Pelvic and perineal pain: Secondary | ICD-10-CM

## 2021-01-11 DIAGNOSIS — R252 Cramp and spasm: Secondary | ICD-10-CM

## 2021-01-11 DIAGNOSIS — Z3A18 18 weeks gestation of pregnancy: Secondary | ICD-10-CM

## 2021-01-11 DIAGNOSIS — O26892 Other specified pregnancy related conditions, second trimester: Secondary | ICD-10-CM

## 2021-01-11 NOTE — Progress Notes (Signed)
Pt presents for ROB c/o intermittent low back pain and bilateral hip pain.

## 2021-01-11 NOTE — Progress Notes (Signed)
   PRENATAL VISIT NOTE  Subjective:  Connie Fisher is a 25 y.o. G2P1001 at [redacted]w[redacted]d being seen today for ongoing prenatal care.  She is currently monitored for the following issues for this low-risk pregnancy and has Maternal morbid obesity, antepartum (HCC); Rh negative state in antepartum period; Genetic carrier status; and Supervision of other normal pregnancy, antepartum on their problem list.  Patient reports backache and pelvic/hip pain, and leg cramps .  Contractions: Not present. Vag. Bleeding: None.  Movement: Present. Denies leaking of fluid.   The following portions of the patient's history were reviewed and updated as appropriate: allergies, current medications, past family history, past medical history, past social history, past surgical history and problem list.   Objective:   Vitals:   01/11/21 1033  BP: 136/88  Pulse: (!) 109  Weight: 280 lb 6.4 oz (127.2 kg)    Fetal Status: Fetal Heart Rate (bpm): 145    Movement: Present     General:  Alert, oriented and cooperative. Patient is in no acute distress.  Skin: Skin is warm and dry. No rash noted.   Cardiovascular: Normal heart rate noted  Respiratory: Normal respiratory effort, no problems with respiration noted  Abdomen: Soft, gravid, appropriate for gestational age.  Pain/Pressure: Present     Pelvic: Cervical exam deferred        Extremities: Normal range of motion.  Edema: None  Mental Status: Normal mood and affect. Normal behavior. Normal judgment and thought content.   Assessment and Plan:  Pregnancy: G2P1001 at [redacted]w[redacted]d 1. Supervision of other normal pregnancy, antepartum --Anticipatory guidance about next visits/weeks of pregnancy given. --Next visit in 4 weeks  - AFP, Serum, Open Spina Bifida  2. Pelvic pain affecting pregnancy in second trimester, antepartum --Pain bilateral, hip pain, musculoskeletal related to pregnancy --Rest/ice/heat/warm bath/increase PO fluids/Tylenol/pregnancy support belt  -  Ambulatory referral to Physical Therapy  3. Back pain affecting pregnancy in second trimester --See pelvic pain - Ambulatory referral to Physical Therapy  4. Rh negative state in antepartum period --Rhophylac at 28 weeks  5. [redacted] weeks gestation of pregnancy   6. Leg cramps in pregnancy --Heat, stretching before bed, increase PO fluids  Preterm labor symptoms and general obstetric precautions including but not limited to vaginal bleeding, contractions, leaking of fluid and fetal movement were reviewed in detail with the patient. Please refer to After Visit Summary for other counseling recommendations.   Return in about 4 weeks (around 02/08/2021).  Future Appointments  Date Time Provider Department Center  01/12/2021 10:45 AM [facility] US5 [facility]  02/08/2021 10:55 AM [place], Wilmer Floor, CNM [facility] None    Sharen Counter, CNM

## 2021-01-12 ENCOUNTER — Ambulatory Visit: Payer: Medicaid Other | Attending: Obstetrics and Gynecology

## 2021-01-12 ENCOUNTER — Ambulatory Visit (HOSPITAL_BASED_OUTPATIENT_CLINIC_OR_DEPARTMENT_OTHER): Payer: Medicaid Other | Admitting: Maternal & Fetal Medicine

## 2021-01-12 ENCOUNTER — Other Ambulatory Visit: Payer: Self-pay | Admitting: *Deleted

## 2021-01-12 ENCOUNTER — Ambulatory Visit: Payer: Medicaid Other

## 2021-01-12 ENCOUNTER — Other Ambulatory Visit: Payer: Self-pay | Admitting: Obstetrics and Gynecology

## 2021-01-12 DIAGNOSIS — Z3481 Encounter for supervision of other normal pregnancy, first trimester: Secondary | ICD-10-CM | POA: Insufficient documentation

## 2021-01-12 DIAGNOSIS — O283 Abnormal ultrasonic finding on antenatal screening of mother: Secondary | ICD-10-CM | POA: Insufficient documentation

## 2021-01-12 DIAGNOSIS — Z148 Genetic carrier of other disease: Secondary | ICD-10-CM

## 2021-01-12 DIAGNOSIS — O99212 Obesity complicating pregnancy, second trimester: Secondary | ICD-10-CM | POA: Diagnosis not present

## 2021-01-12 DIAGNOSIS — O35DXX0 Maternal care for other (suspected) fetal abnormality and damage, fetal gastrointestinal anomalies, not applicable or unspecified: Secondary | ICD-10-CM | POA: Diagnosis not present

## 2021-01-13 LAB — TOXOPLASMA GONDII ANTIBODY, IGG: Toxoplasma IgG Ratio: <3 IU/mL (ref 0.0–7.1)

## 2021-01-13 LAB — TOXOPLASMA GONDII ANTIBODY, IGM: Toxoplasma Antibody- IgM: <3 AU/mL (ref 0.0–7.9)

## 2021-01-13 LAB — AFP, SERUM, OPEN SPINA BIFIDA
AFP MoM: 1.11
AFP Value: 38.2 ng/mL
Gest. Age on Collection Date: 18.6 weeks
Maternal Age At EDD: 26 yr
OSBR Risk 1 IN: 8647
Test Results:: NEGATIVE
Weight: 280 [lb_av]

## 2021-01-13 LAB — PARVOVIRUS B19 ANTIBODY, IGG AND IGM
Parvovirus B19 IgG: 5.3 index — ABNORMAL HIGH (ref 0.0–0.8)
Parvovirus B19 IgM: 0.1 index (ref 0.0–0.8)

## 2021-01-13 LAB — CMV ANTIBODY, IGG (EIA): CMV Ab - IgG: 0.9 U/mL — ABNORMAL HIGH (ref 0.00–0.59)

## 2021-01-13 LAB — INFECT DISEASE AB IGM REFLEX 1

## 2021-01-13 LAB — CMV IGM: CMV IgM Ser EIA-aCnc: <30 AU/mL (ref 0.0–29.9)

## 2021-01-13 NOTE — Progress Notes (Signed)
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  She has a low risk NIPS and is not a carrier for cystic fibrosis.  She reports that this pregnancy has been uncomplicated with regards to bleeding.  Vitals with BMI 01/11/2021 12/14/2020 11/16/2020  Height - - -  Weight 280 lbs 6 oz 276 lbs 268 lbs 3 oz  BMI - - 47.52  Systolic 136 121 449  Diastolic 88 78 77  Pulse 109 89 87   CBC Latest Ref Rng & Units 11/16/2020 07/03/2020 07/02/2020  WBC 3.4 - 10.8 x10E3/uL 8.3 12.8(H) 10.7(H)  Hemoglobin 11.1 - 15.9 g/dL 67.5 11.1(L) 11.8(L)  Hematocrit 34.0 - 46.6 % 36.1 33.8(L) 34.3(L)  Platelets 150 - 450 x10E3/uL 289 207 219   OB History  Gravida Para Term Preterm AB Living  2 1 1  0 0 1  SAB IAB Ectopic Multiple Live Births  0 0 0 0 1    # Outcome Date GA Lbr Len/2nd Weight Sex Delivery Anes PTL Lv  2 Current           1 Term 03/24/19 [redacted]w[redacted]d 12:17 / 04:00 7 lb 7.9 oz (3.399 kg) F Vag-Spont EPI  LIV     Birth Comments: WNL     Name: [redacted]w[redacted]d     Apgar1: 9  Apgar5: 9   Imaging: A single intrauterine pregnancy was observed Normal anatomy with measurements consistent with dates There is good fetal movement and amniotic fluid volume Suboptimal views of the fetal anatomy were obtained secondary to fetal position and maternal habitus.  Today we observed echogenic bowel  Counseling:    Echogenic bowel is defined as areas of abnormal brightness in the fetal abdomen, with echogenicity similar to that of surrounding bone, and producing various amounts of acoustic shadowing. Echogenic bowel (approximately 1% of second-trimester pregnancies) results from excessively thick meconium caused by an aperistaltic small bowel. Swallowed blood can  also produce an echogenic appearance. Echogenic bowel is associated with aneuploidy, TORCH (toxoplasmosis, other [e.g., syphilis, parvovirus B19, varicella], rubella, cytomegalovirus [CMV], herpesvirus) infection, meconium ileus, cystic fibrosis, placental hemorrhage, ischemia, or can be a normal variant. Approximately 50% of fetuses with echogenic bowel have other structural abnormalities or fetal growth restriction (or both). Aneuploidy (trisomy 13, 18, 21, triploidy) is present in up to 10%.; Cystic fibrosis is present in 13%-40% of patients with meconium ileus and TORCH infections are present in 10%.   Approximately 50% of cases of isolated echogenic bowel resolve spontaneously prenatally.   The evaluation for this finding include serum screening, cystic fibrosis screeing, serologies for toxoplasmosis and CMV and serial ultrasounds for fetal growth, amniotic fluid and worsening fetal condition (ie hydrops fetalis). Assuming no chromosome defects, cystic fibrosis, infection, growth restriction, or other associated anatomic abnormalities, the prognosis is good for normal outcome.  I discussed the option of screening vs diagnostic studies and the risk and benefits of both options. At this time [name] opted forgo an amniocentesis, as the diagnosis would not change her care for the pregnancy. She will obtain serologies for CMV, Toxo and parvovirus today. As noted previously she has a normal cell free and a neg CF screen.  [name] will follow up growth in 4 weeks  I spent 30 minute with > 50% in face to face consultation.   Connie Ports, MD

## 2021-01-14 ENCOUNTER — Encounter: Payer: Self-pay | Admitting: Obstetrics and Gynecology

## 2021-01-14 DIAGNOSIS — O283 Abnormal ultrasonic finding on antenatal screening of mother: Secondary | ICD-10-CM | POA: Insufficient documentation

## 2021-01-18 ENCOUNTER — Other Ambulatory Visit: Payer: Self-pay

## 2021-01-18 DIAGNOSIS — R12 Heartburn: Secondary | ICD-10-CM

## 2021-01-18 DIAGNOSIS — O26891 Other specified pregnancy related conditions, first trimester: Secondary | ICD-10-CM

## 2021-01-18 MED ORDER — FAMOTIDINE 20 MG PO TABS: 20.0000 mg | ORAL_TABLET | Freq: Two times a day (BID) | ORAL | 2 refills | Status: DC

## 2021-01-18 NOTE — Progress Notes (Signed)
Pt need RF on famotidine. Rx sent to pharmacy.

## 2021-01-19 ENCOUNTER — Telehealth: Payer: Self-pay | Admitting: Obstetrics and Gynecology

## 2021-01-19 NOTE — Telephone Encounter (Signed)
I left a voicemail for Ms. Brosious on her cell number to call back to talk about her lab testing.  We need to schedule a time for her to come to our [facility] clinic for a lab draw to order CMV IgG avidity testing.  The prior sample at [facility] cannot be used.  The order will be for test number [id number] and requires a refrigerated gel tube.  We can be reached at [phone], ask for the genetic counselor.  Cherly Anderson, MS, CGC

## 2021-01-21 ENCOUNTER — Ambulatory Visit: Payer: Medicaid Other

## 2021-01-25 ENCOUNTER — Ambulatory Visit: Payer: Medicaid Other | Attending: Obstetrics and Gynecology

## 2021-01-25 ENCOUNTER — Other Ambulatory Visit: Payer: Self-pay

## 2021-01-25 DIAGNOSIS — O283 Abnormal ultrasonic finding on antenatal screening of mother: Secondary | ICD-10-CM

## 2021-02-03 LAB — CYTOMEGALOVIRUS IGG AB AVIDITY: CMV IgG Ab Avidity: 0.76 (ref >=0.51)

## 2021-02-04 DIAGNOSIS — Z419 Encounter for procedure for purposes other than remedying health state, unspecified: Secondary | ICD-10-CM | POA: Diagnosis not present

## 2021-02-08 ENCOUNTER — Ambulatory Visit (INDEPENDENT_AMBULATORY_CARE_PROVIDER_SITE_OTHER): Payer: Medicaid Other | Admitting: Advanced Practice Midwife

## 2021-02-08 ENCOUNTER — Encounter: Payer: Self-pay | Admitting: *Deleted

## 2021-02-08 ENCOUNTER — Ambulatory Visit: Payer: Medicaid Other | Attending: Maternal & Fetal Medicine

## 2021-02-08 ENCOUNTER — Other Ambulatory Visit: Payer: Self-pay

## 2021-02-08 ENCOUNTER — Other Ambulatory Visit: Payer: Self-pay | Admitting: *Deleted

## 2021-02-08 ENCOUNTER — Ambulatory Visit: Payer: Medicaid Other | Admitting: *Deleted

## 2021-02-08 VITALS — BP 118/58 | HR 108

## 2021-02-08 VITALS — BP 115/81 | HR 87 | Wt 285.0 lb

## 2021-02-08 DIAGNOSIS — O219 Vomiting of pregnancy, unspecified: Secondary | ICD-10-CM

## 2021-02-08 DIAGNOSIS — O283 Abnormal ultrasonic finding on antenatal screening of mother: Secondary | ICD-10-CM

## 2021-02-08 DIAGNOSIS — Z3480 Encounter for supervision of other normal pregnancy, unspecified trimester: Secondary | ICD-10-CM

## 2021-02-08 DIAGNOSIS — O35DXX0 Maternal care for other (suspected) fetal abnormality and damage, fetal gastrointestinal anomalies, not applicable or unspecified: Secondary | ICD-10-CM | POA: Diagnosis not present

## 2021-02-08 DIAGNOSIS — Z362 Encounter for other antenatal screening follow-up: Secondary | ICD-10-CM | POA: Diagnosis not present

## 2021-02-08 DIAGNOSIS — O99212 Obesity complicating pregnancy, second trimester: Secondary | ICD-10-CM

## 2021-02-08 DIAGNOSIS — Z3A22 22 weeks gestation of pregnancy: Secondary | ICD-10-CM

## 2021-02-08 DIAGNOSIS — Z148 Genetic carrier of other disease: Secondary | ICD-10-CM | POA: Diagnosis not present

## 2021-02-08 DIAGNOSIS — O360120 Maternal care for anti-D [Rh] antibodies, second trimester, not applicable or unspecified: Secondary | ICD-10-CM | POA: Insufficient documentation

## 2021-02-08 MED ORDER — ONDANSETRON 4 MG PO TBDP: 4.0000 mg | ORAL_TABLET | Freq: Four times a day (QID) | ORAL | 2 refills | Status: DC | PRN

## 2021-02-08 MED ORDER — PROMETHAZINE HCL 12.5 MG PO TABS: ORAL_TABLET | ORAL | 2 refills | Status: DC

## 2021-02-08 NOTE — Progress Notes (Signed)
   PRENATAL VISIT NOTE  Subjective:  Connie Fisher is a 25 y.o. G2P1001 at [redacted]w[redacted]d being seen today for ongoing prenatal care.  She is currently monitored for the following issues for this low-risk pregnancy and has Maternal morbid obesity, antepartum (HCC); Rh negative state in antepartum period; Genetic carrier status; Supervision of other normal pregnancy, antepartum; and Echogenic bowel of fetus on prenatal ultrasound on their problem list.  Patient reports no complaints.  Contractions: Not present. Vag. Bleeding: None.  Movement: Present. Denies leaking of fluid.   The following portions of the patient's history were reviewed and updated as appropriate: allergies, current medications, past family history, past medical history, past social history, past surgical history and problem list.   Objective:   Vitals:   02/08/21 1104  BP: 115/81  Pulse: 87  Weight: 285 lb (129.3 kg)    Fetal Status: Fetal Heart Rate (bpm): 150 Fundal Height: 24 cm Movement: Present     General:  Alert, oriented and cooperative. Patient is in no acute distress.  Skin: Skin is warm and dry. No rash noted.   Cardiovascular: Normal heart rate noted  Respiratory: Normal respiratory effort, no problems with respiration noted  Abdomen: Soft, gravid, appropriate for gestational age.  Pain/Pressure: Present     Pelvic: Cervical exam deferred        Extremities: Normal range of motion.     Mental Status: Normal mood and affect. Normal behavior. Normal judgment and thought content.   Assessment and Plan:  Pregnancy: G2P1001 at [redacted]w[redacted]d 1. Supervision of other normal pregnancy, antepartum --Anticipatory guidance about next visits/weeks of pregnancy given. --Next visit in 4 weeks for GTT  2. Echogenic bowel of fetus on prenatal ultrasound --F/U US with MFM today  3. [redacted] weeks gestation of pregnancy   Preterm labor symptoms and general obstetric precautions including but not limited to vaginal bleeding,  contractions, leaking of fluid and fetal movement were reviewed in detail with the patient. Please refer to After Visit Summary for other counseling recommendations.   Return in about 4 weeks (around 03/08/2021).  Future Appointments  Date Time Provider Department Center  02/08/2021  2:45 PM [facility] NURSE [facility]  02/08/2021  3:00 PM [facility] US1 [facility]  03/09/2021  8:00 AM [facility] LAB [facility] None  03/09/2021 10:35 AM Burleson, Brand Males, NP [facility] None    Sharen Counter, CNM

## 2021-02-08 NOTE — Progress Notes (Signed)
Pt states she has been having N&V again, ?Rx.  Pt is having increase in upper leg pain, pt is using support belt.

## 2021-03-04 ENCOUNTER — Other Ambulatory Visit: Payer: Self-pay

## 2021-03-04 ENCOUNTER — Inpatient Hospital Stay (HOSPITAL_COMMUNITY)
Admission: AD | Admit: 2021-03-04 | Discharge: 2021-03-05 | Disposition: A | Discharge status: Home / Self Care | Payer: Medicaid Other | Attending: Family Medicine | Admitting: Family Medicine

## 2021-03-04 ENCOUNTER — Encounter (HOSPITAL_COMMUNITY): Payer: Self-pay | Admitting: Emergency Medicine

## 2021-03-04 DIAGNOSIS — O9A212 Injury, poisoning and certain other consequences of external causes complicating pregnancy, second trimester: Secondary | ICD-10-CM | POA: Diagnosis not present

## 2021-03-04 DIAGNOSIS — R1030 Lower abdominal pain, unspecified: Secondary | ICD-10-CM | POA: Diagnosis not present

## 2021-03-04 DIAGNOSIS — W19XXXA Unspecified fall, initial encounter: Secondary | ICD-10-CM | POA: Insufficient documentation

## 2021-03-04 DIAGNOSIS — R109 Unspecified abdominal pain: Secondary | ICD-10-CM | POA: Insufficient documentation

## 2021-03-04 DIAGNOSIS — R102 Pelvic and perineal pain: Secondary | ICD-10-CM | POA: Insufficient documentation

## 2021-03-04 DIAGNOSIS — Y92009 Unspecified place in unspecified non-institutional (private) residence as the place of occurrence of the external cause: Secondary | ICD-10-CM

## 2021-03-04 DIAGNOSIS — Z3A26 26 weeks gestation of pregnancy: Secondary | ICD-10-CM | POA: Insufficient documentation

## 2021-03-04 DIAGNOSIS — O26892 Other specified pregnancy related conditions, second trimester: Secondary | ICD-10-CM | POA: Diagnosis not present

## 2021-03-04 NOTE — ED Notes (Signed)
Report given to MAU RN .  

## 2021-03-04 NOTE — MAU Note (Signed)
Weds morning about 1000 I stepped on a toy and did a split and fell backward. I started having sharp pain in my vagina that was intermittent. That has subsided some today. Tonight I have had light cramping in lower abd. Good FM. Denies VB or LOF

## 2021-03-04 NOTE — ED Triage Notes (Signed)
Patient is [redacted] weeks pregnant G2P1 , fell yesterday at home , reports vaginal pain and low abdominal cramping today . No spotting.

## 2021-03-04 NOTE — ED Provider Notes (Signed)
Emergency Medicine Provider OB Triage Evaluation Note  Connie Fisher is a 25 y.o. female, G2P1001, at [redacted]w[redacted]d gestation who presents to the emergency department with complaints of fall that occurred yesterday after she fell.  States today some burning in her vaginal area and lower abdominal cramping.  No bleeding or gushes of fluid.  Still feeling fetal movement.  Followed by [facility].  Called OB earlier and told to come in for evaluation.  Review of  Systems  Positive: fall, pregnant Negative: vaginal bleeding  Physical Exam  BP 124/63    Pulse (!) 102    Temp 98 F (36.7 C)    Resp 18    Ht 5\' 3"  (1.6 m)    Wt 132.5 kg    LMP 09/01/2020    SpO2 99%    BMI 51.73 kg/m   General: Awake, no distress  HEENT: Atraumatic  Resp: Normal effort  Cardiac: Normal rate Abd: Nondistended, nontender  MSK: Moves all extremities without difficulty Neuro: Speech clear  Medical Decision Making  Pt evaluated for pregnancy concern and is stable for transfer to MAU. Pt is in agreement with plan for transfer.  10:21 PM Discussed with MAU APP, 09/03/2020, who accepts patient in transfer.  Clinical Impression    1. Fall in pregnancy    Misty Stanley, PA-C 03/05/21 0139    03/07/21, MD 03/10/21 [phone]

## 2021-03-05 ENCOUNTER — Other Ambulatory Visit: Payer: Self-pay | Admitting: Advanced Practice Midwife

## 2021-03-05 ENCOUNTER — Encounter: Payer: Self-pay | Admitting: Advanced Practice Midwife

## 2021-03-05 DIAGNOSIS — B9689 Other specified bacterial agents as the cause of diseases classified elsewhere: Secondary | ICD-10-CM

## 2021-03-05 DIAGNOSIS — Z3A26 26 weeks gestation of pregnancy: Secondary | ICD-10-CM | POA: Diagnosis not present

## 2021-03-05 DIAGNOSIS — O26892 Other specified pregnancy related conditions, second trimester: Secondary | ICD-10-CM

## 2021-03-05 DIAGNOSIS — W19XXXA Unspecified fall, initial encounter: Secondary | ICD-10-CM

## 2021-03-05 DIAGNOSIS — Y92009 Unspecified place in unspecified non-institutional (private) residence as the place of occurrence of the external cause: Secondary | ICD-10-CM

## 2021-03-05 DIAGNOSIS — O9A212 Injury, poisoning and certain other consequences of external causes complicating pregnancy, second trimester: Secondary | ICD-10-CM | POA: Diagnosis not present

## 2021-03-05 DIAGNOSIS — R109 Unspecified abdominal pain: Secondary | ICD-10-CM

## 2021-03-05 DIAGNOSIS — R102 Pelvic and perineal pain: Secondary | ICD-10-CM | POA: Diagnosis not present

## 2021-03-05 DIAGNOSIS — R1030 Lower abdominal pain, unspecified: Secondary | ICD-10-CM | POA: Diagnosis not present

## 2021-03-05 LAB — WET PREP, GENITAL
Sperm: NONE SEEN
Trich, Wet Prep: NONE SEEN
WBC, Wet Prep HPF POC: <10 (ref <10)
Yeast Wet Prep HPF POC: NONE SEEN

## 2021-03-05 LAB — URINALYSIS, ROUTINE W REFLEX MICROSCOPIC
Bilirubin Urine: NEGATIVE
Glucose, UA: NEGATIVE mg/dL
Hgb urine dipstick: NEGATIVE
Ketones, ur: NEGATIVE mg/dL
Nitrite: NEGATIVE
Protein, ur: NEGATIVE mg/dL
Specific Gravity, Urine: 1.026 (ref 1.005–1.030)
pH: 6 (ref 5.0–8.0)

## 2021-03-05 MED ORDER — CYCLOBENZAPRINE HCL 5 MG PO TABS: 5.0000 mg | ORAL_TABLET | Freq: Three times a day (TID) | ORAL | 0 refills | Status: DC | PRN

## 2021-03-05 MED ORDER — METRONIDAZOLE 500 MG PO TABS: 500.0000 mg | ORAL_TABLET | Freq: Two times a day (BID) | ORAL | 0 refills | Status: AC

## 2021-03-05 NOTE — MAU Provider Note (Signed)
Chief Complaint:  [redacted] Weeks Pregnant/Fall/Abdominal Cramping and Fall   Event Date/Time   First Provider Initiated Contact with Patient 03/05/21 0034      HPI: Connie Fisher is a 25 y.o. G2P1001 at 58w3dby LMP who presents to maternity admissions reporting she fell yesterday at home and landed on her knees awkwardly but today started having vaginal/groin pain and abdominal contractions.  She did not hit her abdomen yesterday.  Cramping/contractions are every 30 minutes. She wanted to get things checked out since she fell yesterday. There are no other symptoms.  She reports good fetal movement.   HPI  Past Medical History: Past Medical History:  Diagnosis Date   Heart murmur    AS A BABY   Mor[place]     Past obstetric history: OB History  Gravida Para Term Preterm AB Living  2 1 1  0 0 1  SAB IAB Ectopic Multiple Live Births  0 0 0 0 1    # Outcome Date GA Lbr Len/2nd Weight Sex Delivery Anes PTL Lv  2 Current           1 Term 03/24/19 [id number]:17 / 04:00 3399 g F Vag-Spont EPI  LIV     Birth Comments: WNL    Past Surgical History: Past Surgical History:  Procedure Laterality Date   CHOLECYSTECTOMY N/A 07/02/2020   Procedure: LAPAROSCOPIC CHOLECYSTECTOMY;  Surgeon: WaRolm BookbinderMD;  [facility] Service: General;  Laterality: N/A;   ENDOSCOPIC RETROGRADE CHOLANGIOPANCREATOGRAPHY (ERCP) WITH PROPOFOL N/A 07/01/2020   Procedure: ENDOSCOPIC RETROGRADE CHOLANGIOPANCREATOGRAPHY (ERCP) WITH PROPOFOL;  Surgeon: PeIrene ShipperMD;  [facility];  Service: Endoscopy;  Laterality: N/A;   NO PAST SURGERIES     REMOVAL OF STONES  07/01/2020   Procedure: REMOVAL OF STONES;  Surgeon: PeIrene ShipperMD;  [facility];  Service: Endoscopy;;   SPHINCTEROTOMY  07/01/2020   Procedure: SPJoan Mayans Surgeon: PeIrene ShipperMD;  [facility];  Service: Endoscopy;;    Family History: Family History  Problem Relation Age of Onset    Diabetes Paternal Grandmother    Cancer Maternal Grandmother    Diabetes Father    Deep vein thrombosis Mother    Kidney Stones Mother    Asthma Brother    Seizures Brother    Arthritis Paternal Uncle    Cancer Paternal Uncle     Social History: Social History   Tobacco Use   Smoking status: Never   Smokeless tobacco: Never  Vaping Use   Vaping Use: Never used  Substance Use Topics   Alcohol use: Not Currently    Comment: occ, events, not since confirmed pregnancy   Drug use: Never    Allergies: No Known Allergies  Meds:  Medications Prior to Admission  Medication Sig Dispense Refill Last Dose   aspirin EC 81 MG tablet Take 1 tablet (81 mg total) by mouth daily. Swallow whole. 30 tablet 11 Past Week   Blood Pressure Monitoring (BLOOD PRESSURE KIT) DEVI 1 kit by Does not apply route once a week. 1 each 0    famotidine (PEPCID) 20 MG tablet Take 1 tablet (20 mg total) by mouth 2 (two) times daily. 30 tablet 2    Misc. Devices (GOJJI WEIGHT SCALE) MISC 1 Device by Does not apply route every 30 (thirty) days. 1 each 0    ondansetron (ZOFRAN-ODT) 4 MG disintegrating tablet Take 1 tablet (4 mg total) by mouth every 6 (six) hours as needed for  nausea. 20 tablet 2    Prenatal MV & Min w/FA-DHA (PRENATAL GUMMIES PO) Take by mouth.      promethazine (PHENERGAN) 12.5 MG tablet Take 1-2 tablets at bedtime. Add 1 additional daytime dose as needed. 30 tablet 2     ROS:  Review of Systems  Constitutional:  Negative for chills, fatigue and fever.  Eyes:  Negative for visual disturbance.  Respiratory:  Negative for shortness of breath.   Cardiovascular:  Negative for chest pain.  Gastrointestinal:  Positive for abdominal pain. Negative for nausea and vomiting.  Genitourinary:  Positive for vaginal pain. Negative for difficulty urinating, dysuria, flank pain, pelvic pain, vaginal bleeding and vaginal discharge.  Neurological:  Negative for dizziness and headaches.   Psychiatric/Behavioral: Negative.      I have reviewed patient's Past Medical Hx, Surgical Hx, Family Hx, Social Hx, medications and allergies.   Physical Exam  Patient Vitals for the past 24 hrs:  BP Temp Temp src Pulse Resp SpO2 Height Weight  03/04/21 2250 124/63 -- -- (!) 102 -- 99 % -- --  03/04/21 2247 -- 98 F (36.7 C) -- -- 18 -- 5' 3"  (1.6 m) 132.5 kg  03/04/21 2224 112/62 99.4 F (37.4 C) Oral (!) 105 20 100 % -- --  03/04/21 2218 -- -- -- -- -- -- 5' 3"  (1.6 m) (!) 140 kg   Constitutional: Well-developed, well-nourished female in no acute distress.  Cardiovascular: normal rate Respiratory: normal effort GI: Abd soft, non-tender, gravid appropriate for gestational age.  MS: Extremities nontender, no edema, normal ROM Neurologic: Alert and oriented x 4.  GU: Neg CVAT.  PELVIC EXAM:   Dilation: Closed Effacement (%): Thick Cervical Position: Posterior Exam by:: Fatima Blank, CNM  FHT:  Baseline 135 , moderate variability, accelerations present, no decelerations Contractions: None on toco or to palpation   Labs: Results for orders placed or performed during the hospital encounter of 03/04/21 (from the past 24 hour(s))  Urinalysis, Routine w reflex microscopic Urine, Clean Catch     Status: Abnormal   Collection Time: 03/04/21 10:54 PM  Result Value Ref Range   Color, Urine YELLOW YELLOW   APPearance HAZY (A) CLEAR   Specific Gravity, Urine 1.026 1.005 - 1.030   pH 6.0 5.0 - 8.0   Glucose, UA NEGATIVE NEGATIVE mg/dL   Hgb urine dipstick NEGATIVE NEGATIVE   Bilirubin Urine NEGATIVE NEGATIVE   Ketones, ur NEGATIVE NEGATIVE mg/dL   Protein, ur NEGATIVE NEGATIVE mg/dL   Nitrite NEGATIVE NEGATIVE   Leukocytes,Ua TRACE (A) NEGATIVE   RBC / HPF 0-5 0 - 5 RBC/hpf   WBC, UA 6-10 0 - 5 WBC/hpf   Bacteria, UA RARE (A) NONE SEEN   Squamous Epithelial / LPF 11-20 0 - 5   Mucus PRESENT    A/Negative/-- (09/12 1116)  Imaging:   MAU Course/MDM: Orders Placed  This Encounter  Procedures   Wet prep, genital   Urinalysis, Routine w reflex microscopic Urine, Clean Catch   Discharge patient Discharge disposition: 01-Home or Self Care; Discharge patient date: 03/05/2021    Meds ordered this encounter  Medications   cyclobenzaprine (FLEXERIL) 5 MG tablet    Sig: Take 1-2 tablets (5-10 mg total) by mouth 3 (three) times daily as needed for muscle spasms.    Dispense:  30 tablet    Refill:  0    Order Specific Question:   Supervising Provider    Answer:   Donnamae Jude [[id number]]     NST  reviewed and reactive No evidence of preterm labor with closed cervix Wet prep pending given vaginal pain Reassurance provided to pt Rest/ice/heat/warm bath/increase PO fluids/Tylenol/pregnancy support belt Rx for Flexeril for PRN use for MSK pain/spasms F/U in office this week as scheduled Return to MAU as needed for emergencies    Assessment: 1. Traumatic injury during pregnancy, antepartum, second trimester   2. [redacted] weeks gestation of pregnancy   3. Fall at home, initial encounter   4. Abdominal pain in pregnancy, second trimester   5. Vaginal pain     Plan: Discharge home Labor precautions and fetal kick counts  Follow-up Information     [place] Follow up.   Why: As scheduled Contact information: [place] [phone] [place] [facility] Follow up.   Specialty: Obstetrics and Gynecology Why: As needed for emergencies Contact information: [address] [id number] [place] [phone]               Allergies as of 03/05/2021   No Known Allergies      Medication List     TAKE these medications    aspirin EC 81 MG tablet Take 1 tablet (81 mg total) by mouth daily. Swallow whole.   Blood Pressure Kit Devi 1 kit by Does not apply route once a week.   cyclobenzaprine 5 MG tablet Commonly known  as: FLEXERIL Take 1-2 tablets (5-10 mg total) by mouth 3 (three) times daily as needed for muscle spasms.   famotidine 20 MG tablet Commonly known as: Pepcid Take 1 tablet (20 mg total) by mouth 2 (two) times daily.   Gojji Weight Scale Misc 1 Device by Does not apply route every 30 (thirty) days.   ondansetron 4 MG disintegrating tablet Commonly known as: ZOFRAN-ODT Take 1 tablet (4 mg total) by mouth every 6 (six) hours as needed for nausea.   PRENATAL GUMMIES PO Take by mouth.   promethazine 12.5 MG tablet Commonly known as: PHENERGAN Take 1-2 tablets at bedtime. Add 1 additional daytime dose as needed.        Fatima Blank Certified Nurse-Midwife 03/05/2021 12:56 AM

## 2021-03-07 DIAGNOSIS — Z419 Encounter for procedure for purposes other than remedying health state, unspecified: Secondary | ICD-10-CM | POA: Diagnosis not present

## 2021-03-07 NOTE — L&D Delivery Note (Addendum)
Vaginal Delivery Note ? ?Pre-procedure Diagnosis: SIUP @ [redacted]w[redacted]d ?Indications: 26 y.o. G2P1001 Estimated Date of Delivery: 06/08/21 here today for spontaneous onset of labor. Her pregnancy has been complicated by GDM, Rh negative status, LGA, echogenic bowel of fetus . Her labor course was uncomplicated.   ?Post-procedure Diagnosis: SIUP @ [redacted]w[redacted]d ; same ? ?Provider: Greggory Keen, SNM; Maryagnes Amos CNM ? ?Anesthesia: epidural ? ?Complications: none ? ?Delivery Estimated Blood Loss (EBL): 175  mL ? ?Transfusions: none ? ?Pathology: placenta discarded on l&d routinely ? ?Labor Events: ?Rupture date: 05/28/2021 , at 1:44 AM .  ?Rupture type: Artificial [2];Intact [6]  ?Fluid characteristic: Clear [1];[place] [9]  ?Interval from ROM to Delivery: 9h 79m ?Induction: None [1]  ?Augmentation: AROM [2];Pitocin [6]  ?Sex: F ? ?Delivery Information for baby girl of Connie Fisher ?Time of Birth: 10:56 AM  ?Baby Weight:  pending ? ?APGARS One minute Five minutes ?Totals:      ?Newborn is AGAand well. Patient plans to breastfeed.   ? ?Procedure:  ? The patient was in semi fowlers position, draped in a routine fashion.   ?SVD of a viable female infant in cephalic presentation delivered spontaneously over a supported perineum. No nuchal cord. No dystocia. No meconium present. Nose and mouth suctioned with bulb; cord clamped and cut. The placenta was then delivered spontaneously and intact via Delena Bali; [facility]. The uterine fundus was firm after uterine massage and with a 300 ml bolus of 30 units of pitocin. Inspection revealed  a hemostatic 1st degree laceration not requiring repair at this time. Instrument, sponge, and needle counts were correct at the end of the procedure.  ? ?Following SVD, a post-placental IUD (ParaGard) was successfully placed in uterina fundus without complication ? ? ?Disposition: stable to MBU ? ? ?Electronically Signed By: ?Greggory Keen, SNM ?05/28/21 11:24 AM   ? ? ?Attestation of CNM Supervision of midwife  student: Evaluation and management procedures were performed by the midwife student under my supervision. I was immediately available for direct supervision, assistance and direction throughout this encounter. I also confirm that I have verified the information documented in the resident?s note, and that I have also personally reperformed the pertinent components of the physical exam and all of the medical decision making activities.  I have also made any necessary editorial changes. ? ?Renee Harder, CNM ?05/28/2021 11:29 AM ? ? ?Post-placental IUD was placed by me.  ?Post-Placental IUD Insertion Procedure Note ? ?Patient identified, informed consent signed prior to delivery, signed copy in chart, time out was performed.   ? ?Vaginal, labial and perineal areas thoroughly inspected for lacerations. First degree laceration identified - hemostatic, not repaired prior to insertion of Paragard IUD.  ? ?- IUD grasped between sterile gloved fingers. Sterile lubrication applied to sterile gloved hand for ease of insertion. Fundus identified through abdominal wall using non-insertion hand. IUD inserted to fundus with bimanual technique. IUD carefully released at the fundus and insertion hand gently removed from vagina.  ? ?Strings trimmed to the level of the introitus. Patient tolerated procedure well. ? ?Lot # [id number] ?Expiration Date: 09/2025 ? ?Patient given post procedure instructions and IUD care card with expiration date.  Patient is asked to keep IUD strings tucked in her vagina until her postpartum follow up visit in 4-6 weeks. Patient advised to abstain from sexual intercourse and pulling on strings before her follow-up visit. Patient verbalized an understanding of the plan of care and agrees.  ? ?

## 2021-03-09 ENCOUNTER — Encounter: Payer: Self-pay | Admitting: Nurse Practitioner

## 2021-03-09 ENCOUNTER — Other Ambulatory Visit: Payer: Self-pay

## 2021-03-09 ENCOUNTER — Other Ambulatory Visit: Payer: Medicaid Other

## 2021-03-09 ENCOUNTER — Ambulatory Visit (INDEPENDENT_AMBULATORY_CARE_PROVIDER_SITE_OTHER): Payer: Medicaid Other | Admitting: Nurse Practitioner

## 2021-03-09 VITALS — BP 119/74 | HR 87 | Wt 295.0 lb

## 2021-03-09 DIAGNOSIS — O24410 Gestational diabetes mellitus in pregnancy, diet controlled: Secondary | ICD-10-CM

## 2021-03-09 DIAGNOSIS — Z23 Encounter for immunization: Secondary | ICD-10-CM

## 2021-03-09 DIAGNOSIS — Z3480 Encounter for supervision of other normal pregnancy, unspecified trimester: Secondary | ICD-10-CM

## 2021-03-09 DIAGNOSIS — Z3A27 27 weeks gestation of pregnancy: Secondary | ICD-10-CM

## 2021-03-09 NOTE — Progress Notes (Signed)
Patient presents for ROB and GTT. Patient has no concerns today. TDAP given today.  PHQ 9 score: 4

## 2021-03-09 NOTE — Progress Notes (Signed)
° ° °  Subjective:  Connie Fisher is a 26 y.o. G2P1001 at [redacted]w[redacted]d being seen today for ongoing prenatal care.  She is currently monitored for the following issues for this low-risk pregnancy and has Maternal morbid obesity, antepartum (HCC); Rh negative state in antepartum period; Genetic carrier status; Supervision of other normal pregnancy, antepartum; and Echogenic bowel of fetus on prenatal ultrasound on their problem list.  Patient reports no complaints.  Contractions: Not present. Vag. Bleeding: None.  Movement: Present. Denies leaking of fluid.   The following portions of the patient's history were reviewed and updated as appropriate: allergies, current medications, past family history, past medical history, past social history, past surgical history and problem list. Problem list updated.  Objective:   Vitals:   03/09/21 0843  BP: 119/74  Pulse: 87  Weight: 295 lb (133.8 kg)    Fetal Status: Fetal Heart Rate (bpm): 150 Fundal Height: 30 cm Movement: Present     General:  Alert, oriented and cooperative. Patient is in no acute distress.  Skin: Skin is warm and dry. No rash noted.   Cardiovascular: Normal heart rate noted  Respiratory: Normal respiratory effort, no problems with respiration noted  Abdomen: Soft, gravid, appropriate for gestational age. Pain/Pressure: Present     Pelvic:  Cervical exam deferred        Extremities: Normal range of motion.  Edema: None  Mental Status: Normal mood and affect. Normal behavior. Normal judgment and thought content.   Urinalysis:      Assessment and Plan:  Pregnancy: G2P1001 at [redacted]w[redacted]d  1. Supervision of other normal pregnancy, antepartum Had epidural with last pregnancy Thinking she wants a waterbirth.  Advised to take class. Would need to be low risk Advised taking childbirth classes to learn strategies for dealing with pain in labor - waterbirth is one strategy but may need more if wanting to give birth without epidural this  time.  - Glucose Tolerance, 2 Hours w/1 Hour - CBC - HIV Antibody (routine testing w rflx) - RPR - Tdap vaccine greater than or equal to 7yo IM  2. [redacted] weeks gestation of pregnancy  Preterm labor symptoms and general obstetric precautions including but not limited to vaginal bleeding, contractions, leaking of fluid and fetal movement were reviewed in detail with the patient. Please refer to After Visit Summary for other counseling recommendations.  Return in 2 weeks (on 03/23/2021) for in person ROB.  Nolene Bernheim, RN, MSN, NP-BC Nurse Practitioner, [facility] for [employer], [facility] 03/09/2021 10:14 AM

## 2021-03-10 LAB — CBC
Hematocrit: 33.5 % — ABNORMAL LOW (ref 34.0–46.6)
Hemoglobin: 11.4 g/dL (ref 11.1–15.9)
MCH: 30.2 pg (ref 26.6–33.0)
MCHC: 34 g/dL (ref 31.5–35.7)
MCV: 89 fL (ref 79–97)
Platelets: 227 10*3/uL (ref 150–450)
RBC: 3.77 x10E6/uL (ref 3.77–5.28)
RDW: 12 % (ref 11.7–15.4)
WBC: 9.3 10*3/uL (ref 3.4–10.8)

## 2021-03-10 LAB — HIV ANTIBODY (ROUTINE TESTING W REFLEX): HIV Screen 4th Generation wRfx: NONREACTIVE

## 2021-03-10 LAB — RPR: RPR Ser Ql: NONREACTIVE

## 2021-03-10 LAB — GLUCOSE TOLERANCE, 2 HOURS W/ 1HR
Glucose, 1 hour: 119 mg/dL (ref 70–179)
Glucose, 2 hour: 125 mg/dL (ref 70–152)
Glucose, Fasting: 92 mg/dL — ABNORMAL HIGH (ref 70–91)

## 2021-03-11 ENCOUNTER — Other Ambulatory Visit: Payer: Self-pay

## 2021-03-11 DIAGNOSIS — O24419 Gestational diabetes mellitus in pregnancy, unspecified control: Secondary | ICD-10-CM | POA: Insufficient documentation

## 2021-03-11 MED ORDER — ACCU-CHEK SOFTCLIX LANCETS MISC: 100.0000 | Freq: Four times a day (QID) | 12 refills | Status: DC

## 2021-03-11 MED ORDER — ACCU-CHEK GUIDE W/DEVICE KIT: 1.0000 | PACK | Freq: Every day | 0 refills | Status: DC

## 2021-03-11 MED ORDER — ACCU-CHEK GUIDE VI STRP: ORAL_STRIP | 12 refills | Status: DC

## 2021-03-11 NOTE — Addendum Note (Signed)
Addended by: Virginia Rochester on: 03/11/2021 10:19 AM   Modules accepted: Orders

## 2021-03-15 ENCOUNTER — Other Ambulatory Visit: Payer: Self-pay | Admitting: *Deleted

## 2021-03-15 ENCOUNTER — Ambulatory Visit: Payer: Medicaid Other | Admitting: *Deleted

## 2021-03-15 ENCOUNTER — Ambulatory Visit: Payer: Medicaid Other | Attending: Obstetrics and Gynecology

## 2021-03-15 ENCOUNTER — Encounter: Payer: Self-pay | Admitting: *Deleted

## 2021-03-15 ENCOUNTER — Other Ambulatory Visit: Payer: Self-pay

## 2021-03-15 VITALS — BP 118/54 | HR 98

## 2021-03-15 DIAGNOSIS — Z6791 Unspecified blood type, Rh negative: Secondary | ICD-10-CM

## 2021-03-15 DIAGNOSIS — E669 Obesity, unspecified: Secondary | ICD-10-CM

## 2021-03-15 DIAGNOSIS — Z3480 Encounter for supervision of other normal pregnancy, unspecified trimester: Secondary | ICD-10-CM

## 2021-03-15 DIAGNOSIS — O26892 Other specified pregnancy related conditions, second trimester: Secondary | ICD-10-CM

## 2021-03-15 DIAGNOSIS — Z362 Encounter for other antenatal screening follow-up: Secondary | ICD-10-CM | POA: Diagnosis not present

## 2021-03-15 DIAGNOSIS — O99212 Obesity complicating pregnancy, second trimester: Secondary | ICD-10-CM | POA: Diagnosis not present

## 2021-03-15 DIAGNOSIS — O283 Abnormal ultrasonic finding on antenatal screening of mother: Secondary | ICD-10-CM

## 2021-03-15 DIAGNOSIS — O35DXX0 Maternal care for other (suspected) fetal abnormality and damage, fetal gastrointestinal anomalies, not applicable or unspecified: Secondary | ICD-10-CM | POA: Diagnosis not present

## 2021-03-15 DIAGNOSIS — Z418 Encounter for other procedures for purposes other than remedying health state: Secondary | ICD-10-CM | POA: Insufficient documentation

## 2021-03-15 DIAGNOSIS — Z3A27 27 weeks gestation of pregnancy: Secondary | ICD-10-CM | POA: Diagnosis not present

## 2021-03-15 DIAGNOSIS — Z6841 Body Mass Index (BMI) 40.0 and over, adult: Secondary | ICD-10-CM

## 2021-03-15 DIAGNOSIS — O360190 Maternal care for anti-D [Rh] antibodies, unspecified trimester, not applicable or unspecified: Secondary | ICD-10-CM | POA: Insufficient documentation

## 2021-03-15 DIAGNOSIS — O24419 Gestational diabetes mellitus in pregnancy, unspecified control: Secondary | ICD-10-CM

## 2021-03-15 MED ORDER — ACCU-CHEK SOFTCLIX LANCETS MISC: 100.0000 | Freq: Four times a day (QID) | 12 refills | Status: DC

## 2021-03-31 ENCOUNTER — Encounter: Payer: Self-pay | Admitting: Nurse Practitioner

## 2021-03-31 ENCOUNTER — Ambulatory Visit (INDEPENDENT_AMBULATORY_CARE_PROVIDER_SITE_OTHER): Payer: Medicaid Other | Admitting: Nurse Practitioner

## 2021-03-31 ENCOUNTER — Other Ambulatory Visit: Payer: Self-pay

## 2021-03-31 VITALS — BP 102/60 | HR 96 | Wt 299.4 lb

## 2021-03-31 DIAGNOSIS — Z6791 Unspecified blood type, Rh negative: Secondary | ICD-10-CM

## 2021-03-31 DIAGNOSIS — O0993 Supervision of high risk pregnancy, unspecified, third trimester: Secondary | ICD-10-CM

## 2021-03-31 DIAGNOSIS — O26899 Other specified pregnancy related conditions, unspecified trimester: Secondary | ICD-10-CM

## 2021-03-31 DIAGNOSIS — Z3A30 30 weeks gestation of pregnancy: Secondary | ICD-10-CM | POA: Diagnosis not present

## 2021-03-31 DIAGNOSIS — O283 Abnormal ultrasonic finding on antenatal screening of mother: Secondary | ICD-10-CM

## 2021-03-31 DIAGNOSIS — O24419 Gestational diabetes mellitus in pregnancy, unspecified control: Secondary | ICD-10-CM

## 2021-03-31 DIAGNOSIS — Z3480 Encounter for supervision of other normal pregnancy, unspecified trimester: Secondary | ICD-10-CM

## 2021-03-31 MED ORDER — RHO D IMMUNE GLOBULIN 1500 UNIT/2ML IJ SOSY
300.0000 ug | PREFILLED_SYRINGE | Freq: Once | INTRAMUSCULAR | Status: AC
  Administered 2021-03-31: 12:00:00 300 ug via INTRAMUSCULAR

## 2021-03-31 NOTE — Patient Instructions (Signed)
BRAINSTORMING  Develop a Plan Goals: Provide a way to start conversation about your new life with a baby Assist parents in recognizing and using resources within their reach Help pave the way before birth for an easier period of transition afterwards.  Make a list of the following information to keep in a central location: Full name of Mom and Partner: _____________________________________________ 41 full name and Date of Birth: ___________________________________________ Home Address: ___________________________________________________________ ________________________________________________________________________ Home Phone: ____________________________________________________________ Parents' cell numbers: _____________________________________________________ ________________________________________________________________________ Name and contact info for OB: ______________________________________________ Name and contact info for Pediatrician:________________________________________ Contact info for Lactation Consultants: ________________________________________  REST and SLEEP *You each need at least 4-5 hours of uninterrupted sleep every day. Write specific names and contact information.* How are you going to rest in the postpartum period? While partner's home? When partner returns to work? When you both return to work? Where will your baby sleep? Who is available to help during the day? Evening? Night? Who could move in for a period to help support you? What are some ideas to help you get enough sleep? __________________________________________________________________________________________________________________________________________________________________________________________________________________________________________ NUTRITIOUS FOOD AND DRINK *Plan for meals before your baby is born so you can have healthy food to eat during the immediate postpartum  period.* Who will look after breakfast? Lunch? Dinner? List names and contact information. Brainstorm quick, healthy ideas for each meal. What can you do before baby is born to prepare meals for the postpartum period? How can others help you with meals? Which grocery stores provide online shopping and delivery? Which restaurants offer take-out or delivery options? ______________________________________________________________________________________________________________________________________________________________________________________________________________________________________________________________________________________________________________________________________________________________________________________________________  CARE FOR MOM *It's important that mom is cared for and pampered in the postpartum period. Remember, the most important ways new mothers need care are: sleep, nutrition, gentle exercise, and time off.* Who can come take care of mom during this period? Make a list of people with their contact information. List some activities that make you feel cared for, rested, and energized? Who can make sure you have opportunities to do these things? Does mom have a space of her very own within your home that's just for her? Make a [facility] where she can be comfortable, rest, and renew herself daily. ______________________________________________________________________________________________________________________________________________________________________________________________________________________________________________________________________________________________________________________________________________________________________________________________________    CARE FOR AND FEEDING BABY *Knowledgeable and encouraging people will offer the best support with regard to feeding your baby.* Educate yourself and choose the best feeding option  for your baby. Make a list of people who will guide, support, and be a resource for you as your care for and feed your baby. (Friends that have breastfed or are currently breastfeeding, lactation consultants, breastfeeding support groups, etc.) Consider a postpartum doula. (These websites can give you information: dona.org & BuyingShow.es) Seek out local breastfeeding resources like the breastfeeding support group at [employer] or [employer]. ______________________________________________________________________________________________________________________________________________________________________________________________________________________________________________________________________________________________________________________________________________________________________________________________________  Verner Chol AND ERRANDS Who can help with a thorough cleaning before baby is born? Make a list of people who will help with housekeeping and chores, like laundry, light cleaning, dishes, bathrooms, etc. Who can run some errands for you? What can you do to make sure you are stocked with basic supplies before baby is born? Who is going to do the shopping? ______________________________________________________________________________________________________________________________________________________________________________________________________________________________________________________________________________________________________________________________________________________________________________________________________     Family Adjustment *Nurture yourselvesit helps parents be more loving and allows for better bonding with their child.* What sorts of things do you and partner enjoy doing together? Which activities help you to connect and strengthen your relationship? Make a list of those things. Make a list of people whom  trust to care for your baby so you  can have some time together as a couple. °What types of things help partner feel connected to Mom? Make a list. °What needs will partner have in order to bond with baby? °Other children? Who will care for them when you go into labor and while you are in the hospital? °Think about what the needs of your older children might be. Who can help you meet those needs? In what ways are you helping them prepare for bringing baby home? List some specific strategies you have for family adjustment. °_______________________________________________________________________________________________________________________________________________________________________________________________________________________________________________________________________________________________________________________________________________ ° °SUPPORT °*Someone who can empathize with experiences normalizes your problems and makes them more bearable.* °Make a list of other friends, neighbors, and/or co-workers you know with infants (and small children, if applicable) with whom you can connect. °Make a list of local or online support groups, mom groups, etc. in which you can be involved. °______________________________________________________________________________________________________________________________________________________________________________________________________________________________________________________________________________________________________________________________________________________________________________________________________ ° °Childcare Plans °Investigate and plan for childcare if mom is returning to work. °Talk about mom's concerns about her transition back to work. °Talk about partner's concerns regarding this transition. ° °Mental Health °*Your mental health is one of the highest priorities for a pregnant or postpartum mom.* °1 in 5 women experience anxiety and/or depression from the time  of conception through the first year after birth. °Postpartum Mood Disorders are the #1 complication of pregnancy and childbirth and the suffering experienced by these mothers is not necessary! These illnesses are temporary and respond well to treatment, which often includes self-care, social support, talk therapy, and medication when needed. °Women experiencing anxiety and depression often say things like: “I'm supposed to be happy…why do I feel so sad?”, “Why can't I snap out of it?”, “I'm having thoughts that scare me.” °There is no need to be embarrassed if you are feeling these symptoms: °Overwhelmed, anxious, angry, sad, guilty, irritable, hopeless, exhausted but can't sleep °You are NOT alone. You are NOT to blame. With help, you WILL be well. °Where can I find help? Medical professionals such as your OB, midwife, gynecologist, family practitioner, primary care provider, pediatrician, or mental health providers; [facility] support groups: Feelings After Birth, Breastfeeding Support Group, Baby and Me Group, and Fit 4 Two exercise classes. °You have permission to ask for help. It will confirm your feelings, validate your experiences, share/learn coping strategies, and gain support and encouragement as you heal. You are important! °BRAINSTORM °Make a list of local resources, including resources for mom and for partner. °Identify support groups. °Identify people to call late at night - include names and contact info. °Talk with partner about perinatal mood and anxiety disorders. °Talk with your OB, midwife, and doula about baby blues and about perinatal mood and anxiety disorders. °Talk with your pediatrician about perinatal mood and anxiety disorders. ° ° °Support & Sanity Savers   °What do you really need? ° °Basics °In preparing for a new baby, many expectant parents spend hours shopping for baby clothes, decorating the nursery, and deciding which car seat to buy. Yet most don't think much about what  the reality of parenting a newborn will be like, and what they need to make it through that. So, here is the advice of experienced parents. We know you'll read this, and think “they're exaggerating, I don't really need that.” Just trust us on these, OK? Plan for all of this, and if it turns out you don't need it, come back and teach us how you did it! ° °Must-Haves (Once baby's survival   needs are met, make sure you attend to your own survival needs!) °Sleep °An average newborn sleeps 16-18 hours per day, over 6-7 sleep periods, rarely more than three hours at a time. It is normal and healthy for a newborn to wake throughout the night... but really hard on parents!! °Naps. Prioritize sleep above any responsibilities like: cleaning house, visiting friends, running errands, etc.  Sleep whenever baby sleeps. If you can't nap, at least have restful times when baby eats. The more rest you get, the more patient you will be, the more emotionally stable, and better at solving problems. ° °Food °You may not have realized it would be difficult to eat when you have a newborn. Yet, when we talk to °countless new parents, they say things like “it may be 2:00 pm when I realize I haven't had breakfast yet.” Or “every time we sit down to dinner, baby needs to eat, and my food gets cold, so I don't bother to eat it.” °Finger food. Before your baby is born, stock up with one months' worth of food that: 1) you can eat with one hand while holding a baby, 2) doesn't need to be prepped, 3) is good hot or cold, 4) doesn't spoil when left out for a few hours, and 5) you like to eat. Think about: nuts, dried fruit, Clif bars, pretzels, jerky, gogurt, baby carrots, apples, bananas, crackers, cheez-n-crackers, string cheese, hot pockets or frozen burritos to microwave, garden burgers and breakfast pastries to put in the toaster, yogurt drinks, etc. °Restaurant Menus. Make lists of your favorite restaurants & menu items. When family/friends  want to help, you can give specific information without much thought. They can either bring you the food or send gift cards for just the right meals. °Freezer Meals.  Take some time to make a few meals to put in the freezer ahead of time.  Easy to freeze meals can be anything such as soup, lasagna, chicken pie, or spaghetti sauce. °Set up a Meal Schedule.  Ask friends and family to sign up to bring you meals during the first few weeks of being home. (It can be passed around at baby showers!) You have no idea how helpful this will be until you are in the throes of parenting.  www.takethemameal.com is a great website to check out. °Emotional Support °Know who to call when you're stressed out. Parenting a newborn is very challenging work. There are times when it totally overwhelms your normal coping abilities. EVERY NEW PARENT NEEDS TO HAVE A PLAN FOR WHO TO CALL WHEN THEY JUST CAN'T COPE ANY MORE. (And it has to be someone other than the baby's other parent!) Before your baby is born, come up with at least one person you can call for support - write their phone number down and post it on the refrigerator. °Anxiety & Sadness. Baby blues are normal after pregnancy; however, there are more severe types of anxiety & sadness which can occur and should not be ignored.  They are always treatable, but you have to take the first step by reaching out for help. [facility] offers a “Mom Talk” group which meets every Tuesday from 10 am - 11 am.  This group is for new moms who need support and connection after their babies are born.  Call [phone].  °Really, Really Helpful (Plan for them! Make sure these happen often!!) °Physical Support with Taking Care of Yourselves °Asking friends and family. Before your baby is born, set up a schedule   schedule of people who can come and visit and help out (or ask a friend to schedule for you). Any time someone says let me know what I can do to help, sign them up for a day. When they get  there, their job is not to take care of the baby (that's your job and your joy). Their job is to take care of you!  Postpartum doulas. If you don't have anyone you can call on for support, look into postpartum doulas:  professionals at helping parents with caring for baby, caring for themselves, getting breastfeeding started, and helping with household tasks. www.padanc.org is a helpful website for learning about doulas in our area. Peer Support / Parent Groups Why: One of the greatest ideas for new parents is to be around other new parents. Parent groups give you a chance to share and listen to others who are going through the same season of life, get a sense of what is normal infant development by watching several babies learn and grow, share your stories of triumph and struggles with empathetic ears, and forgive your own mistakes when you realize all parents are learning by trial and error. Where to find: There are many places you can meet other new parents throughout our community.  [facility] offers the following classes for new moms and their little ones:  Baby and Me (Birth to [place]) and Breastfeeding Support Group. Go to www.conehealthybaby.com or call [phone] for more information. Time for your Relationship It's easy to get so caught up in meeting baby's immediate needs that it's hard to find time to connect with your partner, and meet the needs of your relationship. It's also easy to forget what quality time with your partner actually looks like. If you take your baby on a date, you'd be amazed how much of your couple time is spent feeding the baby, diapering the baby, admiring the baby, and talking about the baby. Dating: Try to take time for just the two of you. Babysitter tip: Sometimes when moms are breastfeeding a newborn, they find it hard to figure out how to schedule outings around baby's unpredictable feeding schedules. Have the babysitter come for a three hour period. When  she comes over, if baby has just eaten, you can leave right away, and come back in two hours. If baby hasn't fed recently, you start the date at home. Once baby gets hungry and gets a good feeding in, you can head out for the rest of your date time. Date Nights at Home: If you can't get out, at least set aside one evening a week to prioritize your relationship: whenever baby dozes off or doesn't have any immediate needs, spend a little time focusing on each other. Potential conflicts: The main relationship conflicts that come up for new parents are: issues related to sexuality, financial stresses, a feeling of an unfair division of household tasks, and conflicts in parenting styles. The more you can work on these issues before baby arrives, the better!  Fun and Frills (Don't forget these and don't feel guilty for indulging in them!) Everyone has something in life that is a fun little treat that they do just for themselves. It may be: reading the morning paper, or going for a daily jog, or having coffee with a friend once a week, or going to a movie on Friday nights, or fine chocolates, or bubble baths, or curling up with a good book. Unless you do fun things for yourself every now and  it's hard to have the energy for fun with your baby. Whatever your “special” treats are, make sure you find a way to continue to indulge in them after your baby is born. These special moments can recharge you, and allow you to return to baby with a new joy ° ° °PERINATAL MOOD DISORDERS: MATERNAL MENTAL HEALTH FROM CONCEPTION THROUGH THE POSTPARTUM PERIOD ° ° °_________________________________________Emergency and Crisis Resources °If you are an imminent risk to self or others, are experiencing intense personal distress, and/or have noticed significant changes in activities of daily living, call:  °911 °[facility]: 336-890-2700 ° [address], [place], [place], [address] °Mobile Crisis:  877-626-1772 °National Suicide Hotline: 988 °Or visit the following crisis centers: °Local Emergency Departments °[facility]: [address], [place]  °336-676-6840. Hours: 8:30AM-5PM. Insurance Accepted: Medicaid, Medicare, and Uninsured.  °[facility]:  [address], [place]  °Mon-Friday 8am-3pm, [phone]   ° °                                                                              ___________ Non-Crisis Resources °To identify specific providers that are covered by your insurance, contact your insurance company or local agencies:  °[facility]: 1-800-256-2452 °[facility]: 888-581-9988 °[facility]: 1-800-939-5911 °Postpartum Support International- Warm-line: 1-800-944-4773  ° °                                                   __Outpatient Therapy and Medication Management  ° °Providers:  °[facility]: [phone] °Hours: 9AM-5PM  Insurance Accepted: AARP, Aetna, BCBS, Cigna, [employer], Humana, Medicare  °[facility]): [phone] °Hours: 8AM-5:30PM  nsurance Accepted: All insurances EXCEPT AARP, Aetna, [employer], and Humana °[facility]: [phone] °Hours: 8AM-8PM Insurance Accepted: Aetna, BCBS, Cigna, [employer], Medicaid, Medicare, Uninsured °[facility]: [phone]- [phone] °[facility]: [phone] °Hours: 8:30AM-7PM Insurance Accepted: Aetna, BCBS, Medicaid, Medicare, Tricare, United Healthcare °[facility]:  [phone]  ° Hours:9AM-5PM Insurance Accepted:  Aetna, BCBS, [facility], Medicaid, United Health Care  °[facility]: [phone] °Hours: 9AM-5:30PM Insurance Accepted: AARP, Aetna, BCBS, Cigna, and Medicaid, Medicare, United Health Care °[facility]:  [phone] °Hours: 9am-5pm Insurance Accepted: BCBS; they do not accept Medicaid/Medicare °[facility]: [phone] °Hours: 9am-9pm Insurance Accepted: All major insurance including Medicaid and Medicare °[facility]: [phone] °Hours: 9AM- 5PM Insurance Accepted: All insurances EXCEPT Medicaid and Medicare. °[facility]: [phone] ° ° °____________                                                                     Parenting Support Groups °[facility]: 336-832-6682 °[facility]:  [phone]- [phone] °Family Support Network: (support for children in the NICU   NICU and/or with special needs), 352-781-9267   ___________                                                                 Mental Health Support Groups Mental Health Association: 401-396-2580    _____________                                                                                  Online Resources Postpartum Support International: http://jones-berg.com/  202-542-7CWC 2Moms Supporting Moms:  www.momssupportingmoms.net

## 2021-03-31 NOTE — Progress Notes (Signed)
Pt reports fetal movement with some irritability.  

## 2021-03-31 NOTE — Progress Notes (Signed)
° ° °  Subjective:  Connie Fisher is a 26 y.o. G2P1001 at [redacted]w[redacted]d being seen today for ongoing prenatal care.  She is currently monitored for the following issues for this high-risk pregnancy and has Maternal morbid obesity, antepartum ([place]); Rh negative state in antepartum period; Genetic carrier status; Supervision of other normal pregnancy, antepartum; Echogenic bowel of fetus on prenatal ultrasound; and Gestational diabetes on their problem list.  Patient reports  braxton hicks contractions noted .  Contractions: Irritability. Vag. Bleeding: None.  Movement: Present. Denies leaking of fluid.   The following portions of the patient's history were reviewed and updated as appropriate: allergies, current medications, past family history, past medical history, past social history, past surgical history and problem list. Problem list updated.  Objective:   Vitals:   03/31/21 1120  BP: 102/60  Pulse: 96  Weight: 299 lb 6.4 oz (135.8 kg)    Fetal Status: Fetal Heart Rate (bpm): 141 Fundal Height: 33 cm Movement: Present     General:  Alert, oriented and cooperative. Patient is in no acute distress.  Skin: Skin is warm and dry. No rash noted.   Cardiovascular: Normal heart rate noted  Respiratory: Normal respiratory effort, no problems with respiration noted  Abdomen: Soft, gravid, appropriate for gestational age. Pain/Pressure: Present     Pelvic:  Cervical exam deferred        Extremities: Normal range of motion.  Edema: None  Mental Status: Normal mood and affect. Normal behavior. Normal judgment and thought content.   Urinalysis:      Assessment and Plan:  Pregnancy: G2P1001 at [redacted]w[redacted]d  1. Supervision of high risk pregnancy in third trimester Doing well.  2. Gestational diabetes mellitus (GDM) in second trimester, gestational diabetes method of control unspecified Checking blood sugars and recording on her phone - did not bring phone into her visit today.  Advised to bring in phone at  next visit as readings will need to be reviewed Reviewed upcoming appt with diabetes educator Reviewed beverages - encouraged to limit juice, drink lots of water that she is doing - has stopped other high sugar beverages.  3. Rh negative state in antepartum period Given today  4. Echogenic bowel of fetus on prenatal ultrasound Upcoming [place] appointments reviewed  5. [redacted] weeks gestation of pregnancy   Preterm labor symptoms and general obstetric precautions including but not limited to vaginal bleeding, contractions, leaking of fluid and fetal movement were reviewed in detail with the patient. Please refer to After Visit Summary for other counseling recommendations.  Return in about 2 weeks (around 04/14/2021) for in person ROB.  Earlie Server, RN, MSN, NP-BC Nurse Practitioner, [facility] for [employer], [facility] 03/31/2021 11:47 AM

## 2021-04-07 DIAGNOSIS — Z419 Encounter for procedure for purposes other than remedying health state, unspecified: Secondary | ICD-10-CM | POA: Diagnosis not present

## 2021-04-12 ENCOUNTER — Encounter: Payer: Self-pay | Admitting: *Deleted

## 2021-04-12 ENCOUNTER — Ambulatory Visit: Payer: Medicaid Other

## 2021-04-12 ENCOUNTER — Other Ambulatory Visit: Payer: Self-pay | Admitting: *Deleted

## 2021-04-12 ENCOUNTER — Ambulatory Visit: Payer: Medicaid Other | Attending: Obstetrics

## 2021-04-12 ENCOUNTER — Ambulatory Visit: Payer: Medicaid Other | Admitting: *Deleted

## 2021-04-12 ENCOUNTER — Other Ambulatory Visit: Payer: Self-pay

## 2021-04-12 VITALS — BP 127/52 | HR 101

## 2021-04-12 DIAGNOSIS — O26892 Other specified pregnancy related conditions, second trimester: Secondary | ICD-10-CM | POA: Insufficient documentation

## 2021-04-12 DIAGNOSIS — O24410 Gestational diabetes mellitus in pregnancy, diet controlled: Secondary | ICD-10-CM | POA: Diagnosis not present

## 2021-04-12 DIAGNOSIS — O99213 Obesity complicating pregnancy, third trimester: Secondary | ICD-10-CM | POA: Insufficient documentation

## 2021-04-12 DIAGNOSIS — Z6841 Body Mass Index (BMI) 40.0 and over, adult: Secondary | ICD-10-CM

## 2021-04-12 DIAGNOSIS — Z3480 Encounter for supervision of other normal pregnancy, unspecified trimester: Secondary | ICD-10-CM

## 2021-04-12 DIAGNOSIS — O283 Abnormal ultrasonic finding on antenatal screening of mother: Secondary | ICD-10-CM

## 2021-04-12 DIAGNOSIS — E668 Other obesity: Secondary | ICD-10-CM | POA: Diagnosis not present

## 2021-04-12 DIAGNOSIS — Z3A31 31 weeks gestation of pregnancy: Secondary | ICD-10-CM | POA: Insufficient documentation

## 2021-04-12 DIAGNOSIS — Z6791 Unspecified blood type, Rh negative: Secondary | ICD-10-CM | POA: Diagnosis not present

## 2021-04-12 DIAGNOSIS — O26893 Other specified pregnancy related conditions, third trimester: Secondary | ICD-10-CM | POA: Diagnosis not present

## 2021-04-12 DIAGNOSIS — Z148 Genetic carrier of other disease: Secondary | ICD-10-CM | POA: Insufficient documentation

## 2021-04-12 DIAGNOSIS — Z362 Encounter for other antenatal screening follow-up: Secondary | ICD-10-CM | POA: Insufficient documentation

## 2021-04-14 ENCOUNTER — Ambulatory Visit (INDEPENDENT_AMBULATORY_CARE_PROVIDER_SITE_OTHER): Payer: Medicaid Other | Admitting: Nurse Practitioner

## 2021-04-14 ENCOUNTER — Encounter: Payer: Medicaid Other | Attending: Nurse Practitioner | Admitting: Registered"

## 2021-04-14 ENCOUNTER — Other Ambulatory Visit: Payer: Self-pay

## 2021-04-14 ENCOUNTER — Encounter: Payer: Self-pay | Admitting: Nurse Practitioner

## 2021-04-14 VITALS — BP 126/74 | HR 97 | Wt 300.0 lb

## 2021-04-14 DIAGNOSIS — Z3A32 32 weeks gestation of pregnancy: Secondary | ICD-10-CM

## 2021-04-14 DIAGNOSIS — R102 Pelvic and perineal pain: Secondary | ICD-10-CM

## 2021-04-14 DIAGNOSIS — O24419 Gestational diabetes mellitus in pregnancy, unspecified control: Secondary | ICD-10-CM

## 2021-04-14 DIAGNOSIS — O26893 Other specified pregnancy related conditions, third trimester: Secondary | ICD-10-CM

## 2021-04-14 DIAGNOSIS — O0993 Supervision of high risk pregnancy, unspecified, third trimester: Secondary | ICD-10-CM

## 2021-04-14 DIAGNOSIS — Z6791 Unspecified blood type, Rh negative: Secondary | ICD-10-CM

## 2021-04-14 DIAGNOSIS — Z6841 Body Mass Index (BMI) 40.0 and over, adult: Secondary | ICD-10-CM

## 2021-04-14 DIAGNOSIS — O26899 Other specified pregnancy related conditions, unspecified trimester: Secondary | ICD-10-CM

## 2021-04-14 NOTE — Progress Notes (Signed)
Pt c/o pelvic pain and pressure.

## 2021-04-14 NOTE — Progress Notes (Signed)
° ° °  Subjective:  Connie Fisher is a 26 y.o. G2P1001 at [redacted]w[redacted]d being seen today for ongoing prenatal care.  She is currently monitored for the following issues for this high-risk pregnancy and has Maternal morbid obesity, antepartum ([place]); Rh negative state in antepartum period; Genetic carrier status; Supervision of other normal pregnancy, antepartum; Echogenic bowel of fetus on prenatal ultrasound; Gestational diabetes; and BMI 45.0-49.9, adult ([place]) on their problem list.  Patient reports  pelvic pain and thinks baby is much lower .  Contractions: Irritability. Vag. Bleeding: None.  Movement: Present. Denies leaking of fluid.   The following portions of the patient's history were reviewed and updated as appropriate: allergies, current medications, past family history, past medical history, past social history, past surgical history and problem list. Problem list updated.  Objective:   Vitals:   04/14/21 1134  BP: 126/74  Pulse: 97  Weight: 300 lb (136.1 kg)    Fetal Status: Fetal Heart Rate (bpm): 157 Fundal Height: 34 cm Movement: Present  Presentation: Vertex  General:  Alert, oriented and cooperative. Patient is in no acute distress.  Skin: Skin is warm and dry. No rash noted.   Cardiovascular: Normal heart rate noted  Respiratory: Normal respiratory effort, no problems with respiration noted  Abdomen: Soft, gravid, appropriate for gestational age. Pain/Pressure: Present     Pelvic:  Cervical exam performed Dilation: Fingertip Effacement (%): Thick Station: -3  Extremities: Normal range of motion.  Edema: None  Mental Status: Normal mood and affect. Normal behavior. Normal judgment and thought content.   Urinalysis:      Assessment and Plan:  Pregnancy: G2P1001 at [redacted]w[redacted]d  1. Supervision of high risk pregnancy in third trimester Understands she is not a candidate for water birth due to high risk conditions Worried about preterm labor - speculum exam done - no evidence of ROM  cervix appears closed and confirmed FT with firm cervix with bimanual exam  2. Gestational diabetes mellitus (GDM) in second trimester, gestational diabetes, diet controlled, possibly uncontrolled as no documentation Missed diabetes educator appointment Has checked some glucose readings but unable to report any numbers - is recording but did not bring recordings to her visits Did not know the correct time frames to check her sugars Reviewed desired targets of fasting less than 95 and 2 hours after meals of 120 Knows how to do fingersticks as she has family members with diabetes Will call to reschedule her appointment  3. Rh negative state in antepartum period Rhogam given last visit  4. BMI 45.0-49.9, adult ([facility])   5. [redacted] weeks gestation of pregnancy   6. Pelvic pain affecting pregnancy in third trimester, antepartum Having lots of pain and feeling braxton hicks over the past few days - was traveling out of town Pubic symphysis quite tender to palpation and having difficulty when standing on one leg getting into the car and turning over at in bed at night  - Ambulatory referral to Physical Therapy  Preterm labor symptoms and general obstetric precautions including but not limited to vaginal bleeding, contractions, leaking of fluid and fetal movement were reviewed in detail with the patient. Please refer to After Visit Summary for other counseling recommendations.  Return in about 2 weeks (around 04/28/2021) for in person [facility].  Earlie Server, RN, MSN, NP-BC Nurse Practitioner, [facility] for [employer] [employer] 04/14/2021 12:17 PM

## 2021-04-26 ENCOUNTER — Other Ambulatory Visit: Payer: Self-pay

## 2021-04-26 ENCOUNTER — Ambulatory Visit: Payer: Medicaid Other | Attending: Obstetrics

## 2021-04-26 ENCOUNTER — Encounter: Payer: Self-pay | Admitting: *Deleted

## 2021-04-26 ENCOUNTER — Ambulatory Visit: Payer: Medicaid Other | Admitting: *Deleted

## 2021-04-26 VITALS — BP 117/58 | HR 97

## 2021-04-26 DIAGNOSIS — Z6841 Body Mass Index (BMI) 40.0 and over, adult: Secondary | ICD-10-CM | POA: Diagnosis not present

## 2021-04-26 DIAGNOSIS — E668 Other obesity: Secondary | ICD-10-CM | POA: Diagnosis not present

## 2021-04-26 DIAGNOSIS — O26892 Other specified pregnancy related conditions, second trimester: Secondary | ICD-10-CM | POA: Insufficient documentation

## 2021-04-26 DIAGNOSIS — O283 Abnormal ultrasonic finding on antenatal screening of mother: Secondary | ICD-10-CM | POA: Diagnosis not present

## 2021-04-26 DIAGNOSIS — Z6791 Unspecified blood type, Rh negative: Secondary | ICD-10-CM | POA: Insufficient documentation

## 2021-04-26 DIAGNOSIS — Z3A33 33 weeks gestation of pregnancy: Secondary | ICD-10-CM

## 2021-04-26 DIAGNOSIS — O0990 Supervision of high risk pregnancy, unspecified, unspecified trimester: Secondary | ICD-10-CM | POA: Insufficient documentation

## 2021-04-26 DIAGNOSIS — O99213 Obesity complicating pregnancy, third trimester: Secondary | ICD-10-CM

## 2021-04-29 ENCOUNTER — Encounter: Payer: Medicaid Other | Admitting: Medical

## 2021-05-01 ENCOUNTER — Other Ambulatory Visit: Payer: Self-pay | Admitting: Advanced Practice Midwife

## 2021-05-01 ENCOUNTER — Encounter (HOSPITAL_COMMUNITY): Payer: Self-pay | Admitting: Family Medicine

## 2021-05-01 ENCOUNTER — Inpatient Hospital Stay (HOSPITAL_COMMUNITY)
Admission: AD | Admit: 2021-05-01 | Discharge: 2021-05-01 | Disposition: A | Discharge status: Home / Self Care | Payer: Medicaid Other | Attending: Family Medicine | Admitting: Family Medicine

## 2021-05-01 ENCOUNTER — Other Ambulatory Visit: Payer: Self-pay

## 2021-05-01 DIAGNOSIS — Z3689 Encounter for other specified antenatal screening: Secondary | ICD-10-CM | POA: Insufficient documentation

## 2021-05-01 DIAGNOSIS — O0990 Supervision of high risk pregnancy, unspecified, unspecified trimester: Secondary | ICD-10-CM

## 2021-05-01 DIAGNOSIS — O9A213 Injury, poisoning and certain other consequences of external causes complicating pregnancy, third trimester: Secondary | ICD-10-CM | POA: Diagnosis not present

## 2021-05-01 DIAGNOSIS — X58XXXA Exposure to other specified factors, initial encounter: Secondary | ICD-10-CM | POA: Diagnosis not present

## 2021-05-01 DIAGNOSIS — W19XXXA Unspecified fall, initial encounter: Secondary | ICD-10-CM | POA: Diagnosis not present

## 2021-05-01 DIAGNOSIS — O26893 Other specified pregnancy related conditions, third trimester: Secondary | ICD-10-CM

## 2021-05-01 DIAGNOSIS — Z3A34 34 weeks gestation of pregnancy: Secondary | ICD-10-CM | POA: Diagnosis not present

## 2021-05-01 DIAGNOSIS — O4703 False labor before 37 completed weeks of gestation, third trimester: Secondary | ICD-10-CM | POA: Insufficient documentation

## 2021-05-01 DIAGNOSIS — S334XXA Traumatic rupture of symphysis pubis, initial encounter: Secondary | ICD-10-CM | POA: Insufficient documentation

## 2021-05-01 DIAGNOSIS — R102 Pelvic and perineal pain: Secondary | ICD-10-CM | POA: Insufficient documentation

## 2021-05-01 DIAGNOSIS — O479 False labor, unspecified: Secondary | ICD-10-CM

## 2021-05-01 HISTORY — DX: Gestational diabetes mellitus in pregnancy, unspecified control: O24.419

## 2021-05-01 LAB — URINALYSIS, ROUTINE W REFLEX MICROSCOPIC
Bilirubin Urine: NEGATIVE
Glucose, UA: NEGATIVE mg/dL
Hgb urine dipstick: NEGATIVE
Ketones, ur: NEGATIVE mg/dL
Leukocytes,Ua: NEGATIVE
Nitrite: NEGATIVE
Protein, ur: NEGATIVE mg/dL
Specific Gravity, Urine: 1.02 (ref 1.005–1.030)
pH: 6 (ref 5.0–8.0)

## 2021-05-01 MED ORDER — MAGNESIUM OXIDE -MG SUPPLEMENT 200 MG PO TABS: 400.0000 mg | ORAL_TABLET | Freq: Every day | ORAL | 3 refills | Status: DC

## 2021-05-01 MED ORDER — CYCLOBENZAPRINE HCL 10 MG PO TABS: 10.0000 mg | ORAL_TABLET | Freq: Three times a day (TID) | ORAL | 2 refills | Status: DC | PRN

## 2021-05-01 NOTE — MAU Provider Note (Addendum)
Event Date/Time   First Provider Initiated Contact with Patient 05/01/21 1740     S [name] is a 26 y.o. G59P1001 pregnant female who presents to MAU today with complaint of occasional Braxton-Hicks contractions and pelvic pain especially when trying to walk around or turn over in the bed. Was seen in MAU on 03/05/21 for pelvic pain after a fall to her knees. She was given flexeril at that visit and it worked until she ran out. Feels popping whenever she tries to turn over as well as sharp pains over her pubic bone and in the creases of her hips. Endorses plenty of fetal movement, denies vaginal bleeding or discharge. No other physical complaints.  Receives care at [facility], previous MAU visit reviewed.  Pertinent items noted in HPI and remainder of comprehensive ROS otherwise negative.   O BP 116/72    Pulse 92    Temp 97.9 F (36.6 C) (Oral)    Resp 17    Ht 5' 3" (1.6 m)    Wt (!) 307 lb 6.4 oz (139.4 kg)    LMP 09/01/2020    SpO2 100%    BMI 54.45 kg/m  Physical Exam Vitals and nursing note reviewed.  Constitutional:      General: She is not in acute distress.    Appearance: She is well-developed. She is obese. She is not ill-appearing.  HENT:     Head: Normocephalic and atraumatic.  Eyes:     Pupils: Pupils are equal, round, and reactive to light.  Cardiovascular:     Rate and Rhythm: Normal rate.  Pulmonary:     Effort: Pulmonary effort is normal.  Abdominal:     Palpations: Abdomen is soft.     Tenderness: There is no abdominal tenderness.  Musculoskeletal:        General: Tenderness (over pubic symphysis) present. Normal range of motion.  Skin:    General: Skin is warm and dry.     Capillary Refill: Capillary refill takes less than 2 seconds.  Neurological:     Mental Status: She is alert and oriented to person, place, and time.  Psychiatric:        Mood and Affect: Mood normal.        Behavior: Behavior normal.        Thought Content: Thought content  normal.        Judgment: Judgment normal.   Fetal Tracing: reactive Baseline: 135 Variability: moderate Accelerations: 15x15 Decelerations:none Toco: relaxed, occasional UI  MAU Course/MDM: Discussed SPD with patient and encouraged her to wear her belly band whenever she is up and walking around. Discussed best body positioning to prevent pubic bone pain when walking or turning over in bed. Offered to refill flexeril and advised her to take it with magnesium at night to help support restful sleep. Also encouraged her to seek pelvic floor physical therapy after baby is born to help her pelvis recover.  Pt amenable to plan.  A Symphysis pubis disruption, initial encounter Pelvic pain in third trimester NST reactive [redacted] weeks gestation of pregnancy  P Discharge from MAU in stable condition with return precautions Follow up at [facility] as scheduled for ongoing prenatal care.  Allergies as of 05/01/2021   No Known Allergies      Medication List     STOP taking these medications    famotidine 20 MG tablet Commonly known as: Pepcid   ondansetron 4 MG disintegrating tablet Commonly known as: ZOFRAN-ODT   promethazine 12.5  MG tablet Commonly known as: PHENERGAN       TAKE these medications    Accu-Chek Guide test strip Generic drug: glucose blood Use as instructed   Accu-Chek Guide w/Device Kit 1 kit by Does not apply route daily.   Accu-Chek Softclix Lancets lancets 100 each by Other route 4 (four) times daily.   aspirin EC 81 MG tablet Take 1 tablet (81 mg total) by mouth daily. Swallow whole.   Blood Pressure Kit Devi 1 kit by Does not apply route once a week.   cyclobenzaprine 10 MG tablet Commonly known as: FLEXERIL Take 1 tablet (10 mg total) by mouth every 8 (eight) hours as needed for muscle spasms. Take daily at bedtime. What changed:  medication strength how much to take when to take this additional instructions   Gojji Weight Scale Misc 1  Device by Does not apply route every 30 (thirty) days.   Magnesium Oxide 200 MG Tabs Commonly known as: Mag-Oxide Take 2 tablets (400 mg total) by mouth at bedtime. If that amount causes loose stools in the am, switch to 229m daily at bedtime.   PRENATAL GUMMIES PO Take by mouth.        WGabriel Carina CNorth Dakota2/25/2023 6:14 PM

## 2021-05-01 NOTE — MAU Note (Signed)
Presents with c/o abdominal tightness and lower abdominal & back pain which began last night.  Denies VB or LOF.  Endorses +FM.

## 2021-05-04 ENCOUNTER — Ambulatory Visit: Payer: Medicaid Other | Admitting: *Deleted

## 2021-05-04 ENCOUNTER — Ambulatory Visit: Payer: Medicaid Other | Attending: Obstetrics

## 2021-05-04 ENCOUNTER — Ambulatory Visit (HOSPITAL_BASED_OUTPATIENT_CLINIC_OR_DEPARTMENT_OTHER): Payer: Medicaid Other | Admitting: *Deleted

## 2021-05-04 ENCOUNTER — Other Ambulatory Visit: Payer: Self-pay | Admitting: Obstetrics

## 2021-05-04 ENCOUNTER — Other Ambulatory Visit: Payer: Self-pay

## 2021-05-04 VITALS — BP 121/45 | HR 93

## 2021-05-04 DIAGNOSIS — Z3A35 35 weeks gestation of pregnancy: Secondary | ICD-10-CM | POA: Diagnosis not present

## 2021-05-04 DIAGNOSIS — O0990 Supervision of high risk pregnancy, unspecified, unspecified trimester: Secondary | ICD-10-CM

## 2021-05-04 DIAGNOSIS — O24410 Gestational diabetes mellitus in pregnancy, diet controlled: Secondary | ICD-10-CM

## 2021-05-04 DIAGNOSIS — Z6841 Body Mass Index (BMI) 40.0 and over, adult: Secondary | ICD-10-CM

## 2021-05-04 NOTE — Procedures (Signed)
Connie Fisher 12/02/1995 [redacted]w[redacted]d  Fetus A Non-Stress Test Interpretation for 05/04/21  Indication: Gestational Diabetes-diet, Unsatisfactory BPP, and BMI 47  Fetal Heart Rate A Mode: External Baseline Rate (A): 130 bpm Variability: Moderate Accelerations: 15 x 15 Decelerations: None Multiple birth?: No  Uterine Activity Mode: Palpation, Toco Contraction Frequency (min): UI Contraction Quality: Mild Resting Tone Palpated: Relaxed Resting Time: Adequate  Interpretation (Fetal Testing) Nonstress Test Interpretation: Reactive Comments: Dr. Grace Bushy reviewed tracing.

## 2021-05-04 NOTE — Addendum Note (Signed)
Addended by: Vikki Ports on: 05/04/2021 04:45 PM   Modules accepted: Level of Service

## 2021-05-05 ENCOUNTER — Encounter: Payer: Medicaid Other | Admitting: Obstetrics

## 2021-05-05 DIAGNOSIS — Z419 Encounter for procedure for purposes other than remedying health state, unspecified: Secondary | ICD-10-CM | POA: Diagnosis not present

## 2021-05-05 MED ORDER — CYCLOBENZAPRINE HCL 10 MG PO TABS: 5.0000 mg | ORAL_TABLET | Freq: Three times a day (TID) | ORAL | 0 refills | Status: DC | PRN

## 2021-05-11 ENCOUNTER — Encounter: Payer: Self-pay | Admitting: *Deleted

## 2021-05-11 ENCOUNTER — Ambulatory Visit: Payer: Medicaid Other | Admitting: *Deleted

## 2021-05-11 ENCOUNTER — Ambulatory Visit: Payer: Medicaid Other | Attending: Obstetrics

## 2021-05-11 ENCOUNTER — Other Ambulatory Visit: Payer: Self-pay

## 2021-05-11 VITALS — BP 102/53 | HR 103

## 2021-05-11 DIAGNOSIS — O360130 Maternal care for anti-D [Rh] antibodies, third trimester, not applicable or unspecified: Secondary | ICD-10-CM | POA: Diagnosis not present

## 2021-05-11 DIAGNOSIS — O99213 Obesity complicating pregnancy, third trimester: Secondary | ICD-10-CM

## 2021-05-11 DIAGNOSIS — O0990 Supervision of high risk pregnancy, unspecified, unspecified trimester: Secondary | ICD-10-CM

## 2021-05-11 DIAGNOSIS — Z3A36 36 weeks gestation of pregnancy: Secondary | ICD-10-CM

## 2021-05-11 DIAGNOSIS — O24410 Gestational diabetes mellitus in pregnancy, diet controlled: Secondary | ICD-10-CM

## 2021-05-11 DIAGNOSIS — Z6841 Body Mass Index (BMI) 40.0 and over, adult: Secondary | ICD-10-CM | POA: Insufficient documentation

## 2021-05-12 ENCOUNTER — Other Ambulatory Visit: Payer: Self-pay | Admitting: *Deleted

## 2021-05-12 ENCOUNTER — Encounter: Payer: Self-pay | Admitting: Obstetrics and Gynecology

## 2021-05-12 ENCOUNTER — Ambulatory Visit (INDEPENDENT_AMBULATORY_CARE_PROVIDER_SITE_OTHER): Payer: Medicaid Other | Admitting: Obstetrics and Gynecology

## 2021-05-12 ENCOUNTER — Other Ambulatory Visit (HOSPITAL_COMMUNITY)
Admission: RE | Admit: 2021-05-12 | Discharge: 2021-05-12 | Disposition: A | Discharge status: Home / Self Care | Payer: Medicaid Other | Source: Ambulatory Visit | Attending: Medical | Admitting: Medical

## 2021-05-12 VITALS — BP 107/76 | HR 94 | Wt 307.9 lb

## 2021-05-12 DIAGNOSIS — O0990 Supervision of high risk pregnancy, unspecified, unspecified trimester: Secondary | ICD-10-CM

## 2021-05-12 DIAGNOSIS — O26899 Other specified pregnancy related conditions, unspecified trimester: Secondary | ICD-10-CM

## 2021-05-12 DIAGNOSIS — Z6841 Body Mass Index (BMI) 40.0 and over, adult: Secondary | ICD-10-CM

## 2021-05-12 DIAGNOSIS — O24410 Gestational diabetes mellitus in pregnancy, diet controlled: Secondary | ICD-10-CM

## 2021-05-12 DIAGNOSIS — Z6791 Unspecified blood type, Rh negative: Secondary | ICD-10-CM

## 2021-05-12 DIAGNOSIS — O283 Abnormal ultrasonic finding on antenatal screening of mother: Secondary | ICD-10-CM

## 2021-05-12 DIAGNOSIS — Z148 Genetic carrier of other disease: Secondary | ICD-10-CM

## 2021-05-12 LAB — OB RESULTS CONSOLE GC/CHLAMYDIA: Gonorrhea: NEGATIVE

## 2021-05-12 NOTE — Patient Instructions (Signed)
Vaginal Delivery ?Vaginal delivery means that you give birth by pushing your baby out of your birth canal (vagina). Your health care team will help you before, during, and after vaginal delivery. ?Birth experiences are unique for every woman and every pregnancy, and birth experiences vary depending on where you choose to give birth. ?What are the risks and benefits? ?Generally, this is safe. However, problems may occur, including: ?Bleeding. ?Infection. ?Damage to other structures such as vaginal tearing. ?Allergic reactions to medicines. ?Despite the risks, benefits of vaginal delivery include less risk of bleeding and infection and a shorter recovery time compared to a Cesarean delivery. Cesarean delivery, or C-section, is the surgical delivery of a baby. ?What happens when I arrive at the birth center or hospital? ?Once you are in labor and have been admitted into the hospital or birth center, your health care team may: ?Review your pregnancy history and any concerns that you have. ?Talk with you about your birth plan and discuss pain control options. ?Check your blood pressure, breathing, and heartbeat. ?Assess your baby's heartbeat. ?Monitor your uterus for contractions. ?Check whether your bag of water (amniotic sac) has broken (ruptured). ?Insert an IV into one of your veins. This may be used to give you fluids and medicines. ?Monitoring ?Your health care team may assess your contractions (uterine monitoring) and your baby's heart rate (fetal monitoring). You may need to be monitored: ?Often, but not continuously (intermittently). ?All the time or for long periods at a time (continuously). Continuous monitoring may be needed if: ?You are taking certain medicines, such as medicine to relieve pain or make your contractions stronger. ?You have pregnancy or labor complications. ?Monitoring may be done by: ?Placing a special stethoscope or a handheld monitoring device on your abdomen to check your baby's heartbeat  and to check for contractions. ?Placing monitors on your abdomen (external monitors) to record your baby's heartbeat and the frequency and length of contractions. ?Placing monitors inside your uterus through your vagina (internal monitors) to record your baby's heartbeat and the frequency, length, and strength of your contractions. Depending on the type of monitor, it may remain in your uterus or on your baby's head until birth. ?Telemetry. This is a type of continuous monitoring that can be done with external or internal monitors. Instead of having to stay in bed, you are able to move around. ?Physical exam ?Your health care team may perform frequent physical exams. This may include: ?Checking how and where your baby is positioned in your uterus. ?Checking your cervix to determine: ?Whether it is thinning out (effacing). ?Whether it is opening up (dilating). ?What happens during labor and delivery? ?Normal labor and delivery is divided into the following three stages: ?Stage 1 ?This is the longest stage of labor. ?Throughout this stage, you will feel contractions. Contractions generally feel mild, infrequent, and irregular at first. They get stronger, more frequent, and more regular as you move through this stage. You may have contractions about every 2-3 minutes. ?This stage ends when your cervix is completely dilated to 4 inches (10 cm) and completely effaced. ?Stage 2 ?This stage starts once your cervix is completely effaced and dilated and lasts until the delivery of your baby. ?This is the stage where you will feel an urge to push your baby out of your vagina. ?You may feel stretching and burning pain, especially when the widest part of your baby's head passes through the vaginal opening (crowning). ?Once your baby is delivered, the umbilical cord will be clamped and   cut. Timing of cutting the cord will depend on your wishes, your baby's health, and your health care provider's practices. ?Your baby will be  placed on your bare chest (skin-to-skin contact) in an upright position and covered with a warm blanket. If you are choosing to breastfeed, watch your baby for feeding cues, like rooting or sucking, and help the baby to your breast for his or her first feeding. ?Stage 3 ?This stage starts immediately after the birth of your baby and ends after you deliver the placenta. ?This stage may take anywhere from 5 to 30 minutes. ?After your baby has been delivered, you will feel contractions as your body expels the placenta. These contractions also help your uterus get smaller and reduce bleeding. ?What can I expect after labor and delivery? ?After labor is over, you and your baby will be assessed closely until you are ready to go home. Your health care team will teach you how to care for yourself and your baby. ?You and your baby may be encouraged to stay in the same room (rooming in) during your hospital stay. This will help promote early bonding and successful breastfeeding. ?Your uterus will be checked and massaged regularly (fundal massage). ?You may continue to receive fluids and medicines through an IV. ?You will have some soreness and pain in your abdomen, vagina, and the area of skin between your vaginal opening and your anus (perineum). ?If an incision was made near your vagina (episiotomy) or if you had some vaginal tearing during delivery, cold compresses may be placed on your episiotomy or your tear. This helps to reduce pain and swelling. ?It is normal to have vaginal bleeding after delivery. Wear a sanitary pad for vaginal bleeding and discharge. ?Summary ?Vaginal delivery means that you will give birth by pushing your baby out of your birth canal (vagina). ?Your health care team will monitor you and your baby throughout the stages of labor. ?After you deliver your baby, your health care team will continue to assess you and your baby to ensure you are both recovering as expected after delivery. ?This  information is not intended to replace advice given to you by your health care provider. Make sure you discuss any questions you have with your health care provider. ?Document Revised: 01/20/2020 Document Reviewed: 01/20/2020 ?Elsevier Patient Education ? 2022 Elsevier Inc. ? ?

## 2021-05-12 NOTE — Progress Notes (Signed)
Subjective:  ?Connie Fisher is a 26 y.o. G2P1001 at [redacted]w[redacted]d being seen today for ongoing prenatal care.  She is currently monitored for the following issues for this high-risk pregnancy and has Maternal morbid obesity, antepartum ([place]); Rh negative state in antepartum period; Genetic carrier status; Supervision of high risk pregnancy, antepartum; Echogenic bowel of fetus on prenatal ultrasound; Gestational diabetes; and BMI 45.0-49.9, adult ([place]) on their problem list. ? ?Patient reports general discomforts of pregnancy.  Contractions: Irritability. Vag. Bleeding: None.  Movement: Present. Denies leaking of fluid.  ? ?The following portions of the patient's history were reviewed and updated as appropriate: allergies, current medications, past family history, past medical history, past social history, past surgical history and problem list. Problem list updated. ? ?Objective:  ? ?Vitals:  ? 05/12/21 0941  ?BP: 107/76  ?Pulse: 94  ?Weight: (!) 307 lb 14.4 oz (139.7 kg)  ? ? ?Fetal Status:     Movement: Present    ? ?General:  Alert, oriented and cooperative. Patient is in no acute distress.  ?Skin: Skin is warm and dry. No rash noted.   ?Cardiovascular: Normal heart rate noted  ?Respiratory: Normal respiratory effort, no problems with respiration noted  ?Abdomen: Soft, gravid, appropriate for gestational age. Pain/Pressure: Present     ?Pelvic:  Cervical exam performed        ?Extremities: Normal range of motion.  Edema: None  ?Mental Status: Normal mood and affect. Normal behavior. Normal judgment and thought content.  ? ?Urinalysis:     ? ?Assessment and Plan:  ?Pregnancy: G2P1001 at [redacted]w[redacted]d ? ?1. Supervision of high risk pregnancy, antepartum ?Labor precautions ?- Culture, beta strep (group b only) ?- Cervicovaginal ancillary only( [place]) ? ?2. Diet controlled gestational diabetes mellitus (GDM) in third trimester ?Reports CBG's in goal range ?Growth 94 % on 3/7 ?Continue with weekly antenatal testing as per  MFM ?IOL 39-40 weeks ? ?3. Echogenic bowel of fetus on prenatal ultrasound ?Stable ? ?4. Genetic carrier status ?Stable ? ?5. Rh negative state in antepartum period ?S/P Rhogam ? ?Term labor symptoms and general obstetric precautions including but not limited to vaginal bleeding, contractions, leaking of fluid and fetal movement were reviewed in detail with the patient. ?Please refer to After Visit Summary for other counseling recommendations.  ?Return in about 1 week (around 05/19/2021) for OB visit, face to face, MD only. ? ? ?Chancy Milroy, MD ?

## 2021-05-13 LAB — CERVICOVAGINAL ANCILLARY ONLY
Chlamydia: NEGATIVE
Comment: NEGATIVE
Comment: NORMAL
Neisseria Gonorrhea: NEGATIVE

## 2021-05-16 LAB — CULTURE, BETA STREP (GROUP B ONLY): Strep Gp B Culture: NEGATIVE

## 2021-05-17 ENCOUNTER — Encounter: Payer: Self-pay | Admitting: Advanced Practice Midwife

## 2021-05-17 ENCOUNTER — Encounter: Payer: Self-pay | Admitting: Obstetrics and Gynecology

## 2021-05-18 ENCOUNTER — Ambulatory Visit: Payer: Medicaid Other | Admitting: *Deleted

## 2021-05-18 ENCOUNTER — Other Ambulatory Visit: Payer: Self-pay

## 2021-05-18 ENCOUNTER — Ambulatory Visit: Payer: Medicaid Other | Attending: Obstetrics

## 2021-05-18 VITALS — BP 122/67 | HR 96

## 2021-05-18 DIAGNOSIS — Z3A37 37 weeks gestation of pregnancy: Secondary | ICD-10-CM

## 2021-05-18 DIAGNOSIS — Z6841 Body Mass Index (BMI) 40.0 and over, adult: Secondary | ICD-10-CM | POA: Insufficient documentation

## 2021-05-18 DIAGNOSIS — O0990 Supervision of high risk pregnancy, unspecified, unspecified trimester: Secondary | ICD-10-CM

## 2021-05-18 DIAGNOSIS — O285 Abnormal chromosomal and genetic finding on antenatal screening of mother: Secondary | ICD-10-CM | POA: Diagnosis not present

## 2021-05-18 DIAGNOSIS — O360190 Maternal care for anti-D [Rh] antibodies, unspecified trimester, not applicable or unspecified: Secondary | ICD-10-CM | POA: Diagnosis not present

## 2021-05-18 DIAGNOSIS — O99213 Obesity complicating pregnancy, third trimester: Secondary | ICD-10-CM | POA: Diagnosis not present

## 2021-05-18 DIAGNOSIS — O24410 Gestational diabetes mellitus in pregnancy, diet controlled: Secondary | ICD-10-CM

## 2021-05-18 DIAGNOSIS — Z148 Genetic carrier of other disease: Secondary | ICD-10-CM

## 2021-05-19 ENCOUNTER — Ambulatory Visit (INDEPENDENT_AMBULATORY_CARE_PROVIDER_SITE_OTHER): Payer: Medicaid Other | Admitting: Obstetrics and Gynecology

## 2021-05-19 VITALS — BP 106/68 | HR 102 | Wt 308.2 lb

## 2021-05-19 DIAGNOSIS — Z6841 Body Mass Index (BMI) 40.0 and over, adult: Secondary | ICD-10-CM

## 2021-05-19 DIAGNOSIS — O0990 Supervision of high risk pregnancy, unspecified, unspecified trimester: Secondary | ICD-10-CM

## 2021-05-19 DIAGNOSIS — O283 Abnormal ultrasonic finding on antenatal screening of mother: Secondary | ICD-10-CM

## 2021-05-19 DIAGNOSIS — Z3A37 37 weeks gestation of pregnancy: Secondary | ICD-10-CM

## 2021-05-19 DIAGNOSIS — O26899 Other specified pregnancy related conditions, unspecified trimester: Secondary | ICD-10-CM

## 2021-05-19 DIAGNOSIS — O24410 Gestational diabetes mellitus in pregnancy, diet controlled: Secondary | ICD-10-CM

## 2021-05-19 NOTE — Progress Notes (Signed)
Pt reports fetal movement with some pressure. 

## 2021-05-19 NOTE — Progress Notes (Signed)
? ?  PRENATAL VISIT NOTE ? ?Subjective:  ?Connie Fisher is a 26 y.o. G2P1001 at [redacted]w[redacted]d being seen today for ongoing prenatal care.  She is currently monitored for the following issues for this high-risk pregnancy and has Maternal morbid obesity, antepartum ([place]); Rh negative state in antepartum period; Genetic carrier status; Supervision of high risk pregnancy, antepartum; Echogenic bowel of fetus on prenatal ultrasound; Gestational diabetes; and BMI 45.0-49.9, adult ([place]) on their problem list. ? ?Patient doing well with no acute concerns today. She reports no complaints.  Contractions: Not present. Vag. Bleeding: None.  Movement: Present. Denies leaking of fluid.  ? ?The following portions of the patient's history were reviewed and updated as appropriate: allergies, current medications, past family history, past medical history, past social history, past surgical history and problem list. Problem list updated. ? ?Objective:  ? ?Vitals:  ? 05/19/21 1402  ?BP: 106/68  ?Pulse: (!) 102  ?Weight: (!) 308 lb 3.2 oz (139.8 kg)  ? ? ?Fetal Status: Fetal Heart Rate (bpm): 145 Fundal Height: 38 cm Movement: Present    ? ?General:  Alert, oriented and cooperative. Patient is in no acute distress.  ?Skin: Skin is warm and dry. No rash noted.   ?Cardiovascular: Normal heart rate noted  ?Respiratory: Normal respiratory effort, no problems with respiration noted  ?Abdomen: Soft, gravid, appropriate for gestational age.  Pain/Pressure: Present     ?Pelvic: Cervical exam deferred        ?Extremities: Normal range of motion.  Edema: None  ?Mental Status:  Normal mood and affect. Normal behavior. Normal judgment and thought content.  ? ?Assessment and Plan:  ?Pregnancy: G2P1001 at [redacted]w[redacted]d ? ?1. Supervision of high risk pregnancy, antepartum ?Continue routine care ? ?2. [redacted] weeks gestation of pregnancy ? ? ?3. Diet controlled gestational diabetes mellitus (GDM) in third trimester ?FBS: 87-89 ?PPBS: 104-117 ?Good blood sugar control;  however, LGA fetus ?Schedule IOL at 39 weeks ? ?4. Rh negative state in antepartum period ?Rhogam after delivery ? ?5. Maternal morbid obesity, antepartum ([place]) ? ? ?6. BMI 45.0-49.9, adult ([place]) ? ? ?7. Echogenic bowel of fetus on prenatal ultrasound ?Serial u/s per MFM, next scan in 1 week ? ?Term labor symptoms and general obstetric precautions including but not limited to vaginal bleeding, contractions, leaking of fluid and fetal movement were reviewed in detail with the patient. ? ?Please refer to After Visit Summary for other counseling recommendations.  ? ?Return in about 1 week (around 05/26/2021) for [facility], in person. ? ? ?Lynnda Shields, MD ?Faculty Attending ?[facility] for [place] ?  ?

## 2021-05-20 ENCOUNTER — Telehealth (HOSPITAL_COMMUNITY): Payer: Self-pay | Admitting: *Deleted

## 2021-05-20 ENCOUNTER — Encounter (HOSPITAL_COMMUNITY): Payer: Self-pay | Admitting: *Deleted

## 2021-05-20 NOTE — Telephone Encounter (Signed)
Preadmission screen  

## 2021-05-25 ENCOUNTER — Ambulatory Visit: Payer: Medicaid Other | Attending: Maternal & Fetal Medicine

## 2021-05-25 ENCOUNTER — Ambulatory Visit: Payer: Medicaid Other | Admitting: *Deleted

## 2021-05-25 ENCOUNTER — Other Ambulatory Visit: Payer: Self-pay

## 2021-05-25 VITALS — BP 119/60 | HR 96

## 2021-05-25 DIAGNOSIS — Z6841 Body Mass Index (BMI) 40.0 and over, adult: Secondary | ICD-10-CM | POA: Insufficient documentation

## 2021-05-25 DIAGNOSIS — O24419 Gestational diabetes mellitus in pregnancy, unspecified control: Secondary | ICD-10-CM | POA: Insufficient documentation

## 2021-05-25 DIAGNOSIS — O24410 Gestational diabetes mellitus in pregnancy, diet controlled: Secondary | ICD-10-CM | POA: Insufficient documentation

## 2021-05-25 DIAGNOSIS — O0990 Supervision of high risk pregnancy, unspecified, unspecified trimester: Secondary | ICD-10-CM | POA: Insufficient documentation

## 2021-05-25 DIAGNOSIS — O360190 Maternal care for anti-D [Rh] antibodies, unspecified trimester, not applicable or unspecified: Secondary | ICD-10-CM

## 2021-05-25 DIAGNOSIS — O99213 Obesity complicating pregnancy, third trimester: Secondary | ICD-10-CM | POA: Insufficient documentation

## 2021-05-25 DIAGNOSIS — Z148 Genetic carrier of other disease: Secondary | ICD-10-CM | POA: Diagnosis not present

## 2021-05-25 DIAGNOSIS — Z3A38 38 weeks gestation of pregnancy: Secondary | ICD-10-CM | POA: Diagnosis not present

## 2021-05-25 DIAGNOSIS — E669 Obesity, unspecified: Secondary | ICD-10-CM

## 2021-05-25 DIAGNOSIS — O285 Abnormal chromosomal and genetic finding on antenatal screening of mother: Secondary | ICD-10-CM | POA: Diagnosis not present

## 2021-05-25 NOTE — Procedures (Signed)
[name] ?[birth year] ?[redacted]w[redacted]d ? ?Fetus A Non-Stress Test Interpretation for 05/25/21 ? ?Indication: Unsatisfactory BPP and BMI 47, GDM-diet ? ?Fetal Heart Rate A ?Mode: External ?Baseline Rate (A): 135 bpm ?Variability: Moderate ?Accelerations: 15 x 15 ?Decelerations: None ?Multiple birth?: No ? ?Uterine Activity ?Mode: Palpation, Toco ?Contraction Frequency (min): Occas w/UI ?Contraction Quality: Mild ?Resting Tone Palpated: Relaxed ?Resting Time: Adequate ? ?Interpretation (Fetal Testing) ?Nonstress Test Interpretation: Reactive ?Comments: Dr. Parke Poisson reviewed tracing. ? ? ?

## 2021-05-26 ENCOUNTER — Ambulatory Visit (INDEPENDENT_AMBULATORY_CARE_PROVIDER_SITE_OTHER): Payer: Medicaid Other | Admitting: Obstetrics and Gynecology

## 2021-05-26 ENCOUNTER — Other Ambulatory Visit: Payer: Self-pay | Admitting: Maternal & Fetal Medicine

## 2021-05-26 ENCOUNTER — Other Ambulatory Visit: Payer: Self-pay | Admitting: Advanced Practice Midwife

## 2021-05-26 VITALS — BP 122/77 | HR 93 | Wt 310.0 lb

## 2021-05-26 DIAGNOSIS — O3660X0 Maternal care for excessive fetal growth, unspecified trimester, not applicable or unspecified: Secondary | ICD-10-CM

## 2021-05-26 DIAGNOSIS — O99213 Obesity complicating pregnancy, third trimester: Secondary | ICD-10-CM

## 2021-05-26 DIAGNOSIS — O26899 Other specified pregnancy related conditions, unspecified trimester: Secondary | ICD-10-CM

## 2021-05-26 DIAGNOSIS — O24410 Gestational diabetes mellitus in pregnancy, diet controlled: Secondary | ICD-10-CM

## 2021-05-26 DIAGNOSIS — O0990 Supervision of high risk pregnancy, unspecified, unspecified trimester: Secondary | ICD-10-CM

## 2021-05-26 DIAGNOSIS — Z6791 Unspecified blood type, Rh negative: Secondary | ICD-10-CM

## 2021-05-26 NOTE — Progress Notes (Signed)
? ? ?  PRENATAL VISIT NOTE ? ?Subjective:  ?Connie Fisher is a 26 y.o. G2P1001 at [redacted]w[redacted]d being seen today for ongoing prenatal care.  She is currently monitored for the following issues for this high-risk pregnancy and has Maternal morbid obesity, antepartum ([place]); Rh negative state in antepartum period; Genetic carrier status; Supervision of high risk pregnancy, antepartum; Echogenic bowel of fetus on prenatal ultrasound; Gestational diabetes; BMI 45.0-49.9, adult ([place]); and LGA (large for gestational age) fetus affecting management of mother on their problem list. ? ?Patient reports no complaints.  Contractions: Not present. Vag. Bleeding: None.  Movement: Present. Denies leaking of fluid.  ? ?The following portions of the patient's history were reviewed and updated as appropriate: allergies, current medications, past family history, past medical history, past social history, past surgical history and problem list.  ? ?Objective:  ? ?Vitals:  ? 05/26/21 1453  ?BP: 122/77  ?Pulse: 93  ?Weight: (!) 310 lb (140.6 kg)  ? ? ?Fetal Status:   Fundal Height: 38 cm Movement: Present  Presentation: Vertex ? ?General:  Alert, oriented and cooperative. Patient is in no acute distress.  ?Skin: Skin is warm and dry. No rash noted.   ?Cardiovascular: Normal heart rate noted  ?Respiratory: Normal respiratory effort, no problems with respiration noted  ?Abdomen: Soft, gravid, appropriate for gestational age.  Pain/Pressure: Absent     ?Pelvic: Cervical exam done  Dilation: 1.5 Effacement (%): 40, 50 Station: -3  ?Extremities: Normal range of motion.  Edema: None  ?Mental Status: Normal mood and affect. Normal behavior. Normal judgment and thought content.  ? ?Assessment and Plan:  ?Pregnancy: G2P1001 at [redacted]w[redacted]d ?1. Supervision of high risk pregnancy, antepartum ? ?Doing well  ?GBS negative  ?Labor precautions.  ? ?2. Rh negative state in antepartum period ? ?Rhogam given at 29 weeks  ? ?3. Diet controlled gestational diabetes  mellitus (GDM) in third trimester ? ?Induction scheduled for 3/28 ?No BS log today- reports good control ?LGA baby. ?Fastings reported less than 95 ?PP lunch is 110's. She has not had any BS over 120 or 95 for fasting.  ? ?Term labor symptoms and general obstetric precautions including but not limited to vaginal bleeding, contractions, leaking of fluid and fetal movement were reviewed in detail with the patient. ?Please refer to After Visit Summary for other counseling recommendations.  ? ?No follow-ups on file. ? ?Future Appointments  ?Date Time Provider [place]  ?06/01/2021  7:15 AM [facility] SCHED ROOM [facility] None  ? ? ?Noni Saupe, NP  ? ? ? ? ?

## 2021-05-26 NOTE — Progress Notes (Signed)
ROB, wants cervix checked. ? ?Induction scheduled for 06/01/21. ? ? ?

## 2021-05-27 ENCOUNTER — Encounter (HOSPITAL_COMMUNITY): Payer: Self-pay | Admitting: Family Medicine

## 2021-05-27 ENCOUNTER — Other Ambulatory Visit: Payer: Self-pay

## 2021-05-27 ENCOUNTER — Inpatient Hospital Stay (HOSPITAL_COMMUNITY)
Admission: AD | Admit: 2021-05-27 | Discharge: 2021-05-29 | DRG: 807 | Disposition: A | Discharge status: Home / Self Care | Payer: Medicaid Other | Attending: Obstetrics and Gynecology | Admitting: Obstetrics and Gynecology

## 2021-05-27 DIAGNOSIS — O3663X0 Maternal care for excessive fetal growth, third trimester, not applicable or unspecified: Principal | ICD-10-CM

## 2021-05-27 DIAGNOSIS — O3660X0 Maternal care for excessive fetal growth, unspecified trimester, not applicable or unspecified: Secondary | ICD-10-CM | POA: Diagnosis present

## 2021-05-27 DIAGNOSIS — O24420 Gestational diabetes mellitus in childbirth, diet controlled: Secondary | ICD-10-CM | POA: Diagnosis not present

## 2021-05-27 DIAGNOSIS — Z3A38 38 weeks gestation of pregnancy: Secondary | ICD-10-CM | POA: Diagnosis not present

## 2021-05-27 DIAGNOSIS — Z30430 Encounter for insertion of intrauterine contraceptive device: Secondary | ICD-10-CM

## 2021-05-27 DIAGNOSIS — O24424 Gestational diabetes mellitus in childbirth, insulin controlled: Secondary | ICD-10-CM | POA: Diagnosis not present

## 2021-05-27 DIAGNOSIS — O0990 Supervision of high risk pregnancy, unspecified, unspecified trimester: Secondary | ICD-10-CM

## 2021-05-27 DIAGNOSIS — O368130 Decreased fetal movements, third trimester, not applicable or unspecified: Secondary | ICD-10-CM

## 2021-05-27 DIAGNOSIS — O26893 Other specified pregnancy related conditions, third trimester: Secondary | ICD-10-CM | POA: Diagnosis present

## 2021-05-27 DIAGNOSIS — O99214 Obesity complicating childbirth: Secondary | ICD-10-CM | POA: Diagnosis present

## 2021-05-27 DIAGNOSIS — Z6791 Unspecified blood type, Rh negative: Secondary | ICD-10-CM

## 2021-05-27 DIAGNOSIS — O24429 Gestational diabetes mellitus in childbirth, unspecified control: Secondary | ICD-10-CM | POA: Diagnosis not present

## 2021-05-27 DIAGNOSIS — O24410 Gestational diabetes mellitus in pregnancy, diet controlled: Secondary | ICD-10-CM

## 2021-05-27 DIAGNOSIS — O4693 Antepartum hemorrhage, unspecified, third trimester: Secondary | ICD-10-CM

## 2021-05-27 DIAGNOSIS — K219 Gastro-esophageal reflux disease without esophagitis: Secondary | ICD-10-CM | POA: Diagnosis not present

## 2021-05-27 DIAGNOSIS — O9962 Diseases of the digestive system complicating childbirth: Secondary | ICD-10-CM | POA: Diagnosis not present

## 2021-05-27 DIAGNOSIS — O24419 Gestational diabetes mellitus in pregnancy, unspecified control: Secondary | ICD-10-CM | POA: Diagnosis present

## 2021-05-27 DIAGNOSIS — Z30014 Encounter for initial prescription of intrauterine contraceptive device: Secondary | ICD-10-CM | POA: Diagnosis not present

## 2021-05-27 DIAGNOSIS — O26899 Other specified pregnancy related conditions, unspecified trimester: Secondary | ICD-10-CM

## 2021-05-27 LAB — CBC
HCT: 36.6 % (ref 36.0–46.0)
Hemoglobin: 12 g/dL (ref 12.0–15.0)
MCH: 29.1 pg (ref 26.0–34.0)
MCHC: 32.8 g/dL (ref 30.0–36.0)
MCV: 88.6 fL (ref 80.0–100.0)
Platelets: 231 10*3/uL (ref 150–400)
RBC: 4.13 MIL/uL (ref 3.87–5.11)
RDW: 14.6 % (ref 11.5–15.5)
WBC: 11.5 10*3/uL — ABNORMAL HIGH (ref 4.0–10.5)
nRBC: 0 % (ref 0.0–0.2)

## 2021-05-27 LAB — GLUCOSE, CAPILLARY: Glucose-Capillary: 94 mg/dL (ref 70–99)

## 2021-05-27 MED ORDER — OXYTOCIN-SODIUM CHLORIDE 30-0.9 UT/500ML-% IV SOLN: 1.0000 m[IU]/min | INTRAVENOUS | Status: DC

## 2021-05-27 MED ORDER — ONDANSETRON HCL 4 MG/2ML IJ SOLN: 4.0000 mg | Freq: Four times a day (QID) | INTRAMUSCULAR | Status: DC | PRN

## 2021-05-27 MED ORDER — LIDOCAINE HCL (PF) 1 % IJ SOLN: 30.0000 mL | INTRAMUSCULAR | Status: DC | PRN

## 2021-05-27 MED ORDER — OXYTOCIN-SODIUM CHLORIDE 30-0.9 UT/500ML-% IV SOLN: 2.5000 [IU]/h | INTRAVENOUS | Status: DC

## 2021-05-27 MED ORDER — LACTATED RINGERS IV SOLN: INTRAVENOUS | Status: DC

## 2021-05-27 MED ORDER — TERBUTALINE SULFATE 1 MG/ML IJ SOLN: 0.2500 mg | Freq: Once | INTRAMUSCULAR | Status: DC | PRN

## 2021-05-27 MED ORDER — ACETAMINOPHEN 325 MG PO TABS: 650.0000 mg | ORAL_TABLET | ORAL | Status: DC | PRN

## 2021-05-27 MED ORDER — MISOPROSTOL 25 MCG QUARTER TABLET: 25.0000 ug | ORAL_TABLET | ORAL | Status: DC | PRN

## 2021-05-27 MED ORDER — OXYTOCIN BOLUS FROM INFUSION
333.0000 mL | Freq: Once | INTRAVENOUS | Status: AC
  Administered 2021-05-28: 333 mL via INTRAVENOUS

## 2021-05-27 MED ORDER — SOD CITRATE-CITRIC ACID 500-334 MG/5ML PO SOLN: 30.0000 mL | ORAL | Status: DC | PRN

## 2021-05-27 MED ORDER — FENTANYL CITRATE (PF) 100 MCG/2ML IJ SOLN: 50.0000 ug | INTRAMUSCULAR | Status: DC | PRN

## 2021-05-27 MED ORDER — OXYCODONE-ACETAMINOPHEN 5-325 MG PO TABS: 1.0000 | ORAL_TABLET | ORAL | Status: DC | PRN

## 2021-05-27 MED ORDER — LACTATED RINGERS IV SOLN: 500.0000 mL | INTRAVENOUS | Status: DC | PRN

## 2021-05-27 NOTE — H&P (Addendum)
OBSTETRIC ADMISSION HISTORY AND PHYSICAL ? ?Evin Chirco is a 26 y.o. female G2P1001 with IUP at 50w2dby LMP presenting for loss of mucus plug, cramping, and vaginal bleeding with small clots. She initially reported DFM, but this has returned to normal. She reports no LOF, no blurry vision, headaches, peripheral edema, or RUQ pain. Patient was noted to be 4 cm dilated while in the MAU, previously 1.5 cm dilated on 3/22. Due to cervical change with vaginal bleeding, patient was admitted to L&D for early labor and monitoring. She plans on breast feeding. She requests postpartum IUD for birth control (Paragard). ? ?She received her prenatal care at [facility]. ? ?Dating: By LMP --->  Estimated Date of Delivery: 06/08/21 ? ?Sono:   ?_0 , CWD, normal anatomy, cephalic presentation, posterior placental lie, 3361 g, 94% EFW ? 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 ?Past Medical History: ?Past Medical History:  ?Diagnosis Date  ? Gestational diabetes   ? Heart murmur   ? AS A BABY  ? [place]   ? ? ?Past Surgical History: ?Past Surgical History:  ?Procedure Laterality Date  ? CHOLECYSTECTOMY N/A 07/02/2020  ? Procedure: LAPAROSCOPIC CHOLECYSTECTOMY;  Surgeon: WRolm Bookbinder MD;  [place]  Service: General;  Laterality: N/A;  ? ENDOSCOPIC RETROGRADE CHOLANGIOPANCREATOGRAPHY (ERCP) WITH PROPOFOL N/A 07/01/2020  ? Procedure: ENDOSCOPIC RETROGRADE CHOLANGIOPANCREATOGRAPHY (ERCP) WITH PROPOFOL;  Surgeon: PIrene Shipper MD;  [facility];  Service: Endoscopy;  Laterality: N/A;  ? REMOVAL OF STONES  07/01/2020  ? Procedure: REMOVAL OF STONES;  Surgeon: PIrene Shipper MD;  [facility];  Service: Endoscopy;;  ? SPHINCTEROTOMY  07/01/2020  ? Procedure: SPHINCTEROTOMY;  Surgeon: PIrene Shipper MD;  [facility];  Service: Endoscopy;;  ? ? ?Obstetrical  History: ?OB History   ? ? Gravida  ?2  ? Para  ?1  ? Term  ?1  ? Preterm  ?0  ? AB  ?0  ? Living  ?1  ?  ? ? SAB  ?0  ? IAB  ?0  ? Ectopic  ?0  ? Multiple  ?0  ? Live Births  ?1  ?   ?  ?  ? ? ?Social History ?Social History  ? ?Socioeconomic History  ? Marital status: Significant Other  ?  Spouse name: Not on file  ? Number of children: Not on file  ? Years of education: Not on file  ? Highest education level: Not on file  ?Occupational History  ? Occupation: UNEMPLOYED  ?Tobacco Use  ? Smoking status: Never  ? Smokeless tobacco: Never  ?Vaping Use  ? Vaping Use: Never used  ?Substance and Sexual Activity  ? Alcohol use: Not Currently  ?  Comment: occ, events, not since confirmed pregnancy  ? Drug use: Never  ? Sexual activity: Yes  ?  Partners: Male  ?  Birth control/protection: None  ?Other Topics Concern  ? Not on file  ?Social History Narrative  ? Not on file  ? ?Social Determinants of Health  ? ?Financial Resource Strain: Not on file  ?Food Insecurity: Not on file  ?Transportation Needs: Not on file  ?Physical Activity: Not on file  ?Stress: Not on file  ?Social Connections: Not on file  ? ? ?Family History: ?Family History  ?Problem Relation Age of Onset  ? Diabetes Paternal Grandmother   ? Cancer Maternal Grandmother   ? Diabetes Father   ?  Deep vein thrombosis Mother   ? Kidney Stones Mother   ? Asthma Brother   ? Seizures Brother   ? Arthritis Paternal Uncle   ? Cancer Paternal Uncle   ? ? ?Allergies: ?No Known Allergies ? ?Medications Prior to Admission  ?Medication Sig Dispense Refill Last Dose  ? Accu-Chek Softclix Lancets lancets 100 each by Other route 4 (four) times daily. 100 each 12 05/27/2021  ? aspirin EC 81 MG tablet Take 1 tablet (81 mg total) by mouth daily. Swallow whole. 30 tablet 11 05/27/2021  ? Blood Glucose Monitoring Suppl (ACCU-CHEK GUIDE) w/Device KIT 1 kit by Does not apply route daily. 1 kit 0 05/27/2021  ? Blood Pressure Monitoring (BLOOD PRESSURE KIT) DEVI 1 kit by Does not apply  route once a week. 1 each 0 05/26/2021  ? cyclobenzaprine (FLEXERIL) 10 MG tablet Take 0.5-1 tablets (5-10 mg total) by mouth 3 (three) times daily as needed for muscle spasms. Take daily at bedtime. 30 tablet 0 Past Week  ? glucose blood (ACCU-CHEK GUIDE) test strip Use as instructed 100 each 12 05/27/2021  ? Magnesium Oxide (MAG-OXIDE) 200 MG TABS Take 2 tablets (400 mg total) by mouth at bedtime. If that amount causes loose stools in the am, switch to $RemoveB'200mg'DquiFStI$  daily at bedtime. 60 tablet 3 05/27/2021  ? Misc. Devices (GOJJI WEIGHT SCALE) MISC 1 Device by Does not apply route every 30 (thirty) days. 1 each 0 Past Week  ? Prenatal MV & Min w/FA-DHA (PRENATAL GUMMIES PO) Take by mouth.   05/27/2021  ? ? ? ?Review of Systems  ?All systems reviewed and negative except as stated in HPI ? ?Pulse (!) 114, temperature 98.2 ?F (36.8 ?C), resp. rate 17, height $RemoveBe'5\' 3"'hfeTJSbAD$  (1.6 m), weight (!) 141.5 kg, last menstrual period 09/01/2020. ? ?General appearance: alert and cooperative ?Lungs: normal work of breathing ?Abdomen: soft, non-tender, gravid  ?Extremities: no LE edema or calf tenderness to palpation  ? ?Presentation: Cephalic per CNM ?Fetal monitoring: Baseline: 145 bpm, Variability: Good {> 6 bpm), Accelerations: Reactive, and Decelerations: Absent ?Uterine activity: Irregular contractions ?Dilation: 4 ?Effacement (%): 50 ?Exam by:: Fatima Blank, CNM ? 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 ?Prenatal Transfer Tool  ?Maternal Diabetes: Yes:  Diabetes Type:  Diet controlled ?Genetic Screening: Abnormal:  Results: Other: MCAD deficiency  carrier ?Maternal Ultrasounds/Referrals: Echogenic bowel; resolved ?Fetal Ultrasounds or other Referrals:  None ?Maternal Substance Abuse:  No ?Significant Maternal Medications:  None ?Significant Maternal Lab Results: Group B Strep negative and Rh negative ? ?No results found for this or any previous visit (from the past 24 hour(s)). ? ?Patient Active Problem List  ? Diagnosis Date Noted  ? LGA (large for gestational age) fetus affecting management of mother 05/26/2021  ? BMI 45.0-49.9, adult ([place]) 04/14/2021  ? Gestational diabetes 03/11/2021  ? Echogenic bowel of fetus on prenatal ultrasound 01/14/2021  ? Supervision of high risk pregnancy, antepartum 11/03/2020  ? Genetic carrier status 09/24/2018  ? Rh negative state in antepartum period 09/12/2018  ? Maternal morbid obesity, antepartum ([place]) 09/10/2018  ? ? ?Assessment/Plan:  ?Kyley Solow is a 26 y.o. G2P1001 at [redacted]w[redacted]d here  for cramping followed by loss of mucus plug and some bleeding with small clots in the presence of DFM with a pregnancy complicated by X3ATF, fetus large for gestational age, maternal morbid obesity, and Rh negative status. ? ?#Labor: Pt is having irregular contractions; however has changed from 1.5-4 while in MAU. Given vaginal bleeding, will admit and augment as needed. Currently managing expectantly. ?#Pain: Pain well tolerated now. Planning on epidural as labor progresses. ?#FWB: Category I ?#ID:  GBS neg ?#MOF: Breast ?#MOC: Postpartum IUD, requesting Paragard, consented at bedside  ? ?Titus Dubin, Medical Student  ?05/27/2021, 10:09 PM ? ?GME ATTESTATION:  ?I saw and evaluated the patient. I agree with the findings and the plan of care as documented in the student?s note. I have made changes to documentation as necessary. ? ?Early labor with vaginal bleeding. Cervical change from 1.5 cm on 3/22 to 4 cm in MAU. Continues to have painful contractions, though irregular pattern. Will expectantly manage for now and reassess in 4 hours.  Plan to augment with AROM/Pitocin as needed.  ? ?A1GDM - CBG stable on admission. EFW 94%. Pelvis proven to 3399 g. Will continue to monitor CBGs every 4 hours in latent phase.  ? ?Vilma Meckel, MD ?OB Fellow, [facility]

## 2021-05-27 NOTE — MAU Provider Note (Signed)
Chief Complaint:  Vaginal Bleeding and Contractions ? ? Event Date/Time  ? First Provider Initiated Contact with Patient 05/27/21 2005   ?  ? ?HPI: Connie Fisher is a 26 y.o. G2P1001 at 40w2dby LMP who presents to maternity admissions reporting painful cramping today with passing of her mucus plug and light bleeding with small clots.  Earlier she was feeling less fetal movement than usual but is feeling movement upon arrival to MAU.   ? ? ?HPI ? ?Past Medical History: ?Past Medical History:  ?Diagnosis Date  ? Gestational diabetes   ? Heart murmur   ? AS A BABY  ? [place]   ? ? ?Past obstetric history: ?OB History  ?Gravida Para Term Preterm AB Living  ?2 1 1  0 0 1  ?SAB IAB Ectopic Multiple Live Births  ?0 0 0 0 1  ?  ?# Outcome Date GA Lbr Len/2nd Weight Sex Delivery Anes PTL Lv  ?2 Current           ?1 Term 03/24/19 321w5d2:17 / 04:00 3399 g F Vag-Spont EPI  LIV  ?   Birth Comments: WNL  ? ? ?Past Surgical History: ?Past Surgical History:  ?Procedure Laterality Date  ? CHOLECYSTECTOMY N/A 07/02/2020  ? Procedure: LAPAROSCOPIC CHOLECYSTECTOMY;  Surgeon: WaRolm BookbinderMD;  [place] [facility] Service: General;  Laterality: N/A;  ? ENDOSCOPIC RETROGRADE CHOLANGIOPANCREATOGRAPHY (ERCP) WITH PROPOFOL N/A 07/01/2020  ? Procedure: ENDOSCOPIC RETROGRADE CHOLANGIOPANCREATOGRAPHY (ERCP) WITH PROPOFOL;  Surgeon: PeIrene ShipperMD;  [facility];  Service: Endoscopy;  Laterality: N/A;  ? REMOVAL OF STONES  07/01/2020  ? Procedure: REMOVAL OF STONES;  Surgeon: PeIrene ShipperMD;  [facility];  Service: Endoscopy;;  ? SPHINCTEROTOMY  07/01/2020  ? Procedure: SPHINCTEROTOMY;  Surgeon: PeIrene ShipperMD;  [facility];  Service: Endoscopy;;  ? ? ?Family History: ?Family History  ?Problem Relation Age of Onset  ? Diabetes Paternal Grandmother   ? Cancer Maternal Grandmother   ? Diabetes Father   ? Deep vein thrombosis Mother   ? Kidney Stones Mother   ? Asthma Brother   ?  Seizures Brother   ? Arthritis Paternal Uncle   ? Cancer Paternal Uncle   ? ? ?Social History: ?Social History  ? ?Tobacco Use  ? Smoking status: Never  ? Smokeless tobacco: Never  ?Vaping Use  ? Vaping Use: Never used  ?Substance Use Topics  ? Alcohol use: Not Currently  ?  Comment: occ, events, not since confirmed pregnancy  ? Drug use: Never  ? ? ?Allergies: No Known Allergies ? ?Meds:  ?Medications Prior to Admission  ?Medication Sig Dispense Refill Last Dose  ? Accu-Chek Softclix Lancets lancets 100 each by Other route 4 (four) times daily. 100 each 12 05/27/2021  ? aspirin EC 81 MG tablet Take 1 tablet (81 mg total) by mouth daily. Swallow whole. 30 tablet 11 05/27/2021  ? Blood Glucose Monitoring Suppl (ACCU-CHEK GUIDE) w/Device KIT 1 kit by Does not apply route daily. 1 kit 0 05/27/2021  ? Blood Pressure Monitoring (BLOOD PRESSURE KIT) DEVI 1 kit by Does not apply route once a week. 1 each 0 05/26/2021  ? cyclobenzaprine (FLEXERIL) 10 MG tablet Take 0.5-1 tablets (5-10 mg total) by mouth 3 (three) times daily as needed for muscle spasms. Take daily at bedtime. 30 tablet 0 Past Week  ? glucose blood (ACCU-CHEK GUIDE) test strip Use as instructed 100 each 12 05/27/2021  ? Magnesium Oxide (MAG-OXIDE) 200  MG TABS Take 2 tablets (400 mg total) by mouth at bedtime. If that amount causes loose stools in the am, switch to 268m daily at bedtime. 60 tablet 3 05/27/2021  ? Misc. Devices (GOJJI WEIGHT SCALE) MISC 1 Device by Does not apply route every 30 (thirty) days. 1 each 0 Past Week  ? Prenatal MV & Min w/FA-DHA (PRENATAL GUMMIES PO) Take by mouth.   05/27/2021  ? ? ?ROS:  ?Review of Systems  ?Constitutional:  Negative for chills, fatigue and fever.  ?Eyes:  Negative for visual disturbance.  ?Respiratory:  Negative for shortness of breath.   ?Cardiovascular:  Negative for chest pain.  ?Gastrointestinal:  Positive for abdominal pain. Negative for nausea and vomiting.  ?Genitourinary:  Positive for vaginal bleeding.  Negative for difficulty urinating, dysuria, flank pain, pelvic pain, vaginal discharge and vaginal pain.  ?Neurological:  Negative for dizziness and headaches.  ?Psychiatric/Behavioral: Negative.    ? ? ?I have reviewed patient's Past Medical Hx, Surgical Hx, Family Hx, Social Hx, medications and allergies.  ? ?Physical Exam  ?Patient Vitals for the past 24 hrs: ? Temp Pulse Resp Height Weight  ?05/27/21 1908 98.2 ?F (36.8 ?C) (!) 114 17 5' 3"  (1.6 m) (!) 141.5 kg  ? ?Constitutional: Well-developed, well-nourished female in no acute distress.  ?Cardiovascular: normal rate ?Respiratory: normal effort ?GI: Abd soft, non-tender, gravid appropriate for gestational age.  ?MS: Extremities nontender, no edema, normal ROM ?Neurologic: Alert and oriented x 4.  ?GU: Neg CVAT. ? ?PELVIC EXAM: Cervix pink, visually 3-4 cm, small amount brown bleeding mixed with mucus, one fox swab needed to visualize cervix, vaginal walls and external genitalia normal ? ? ?Dilation: 4 ?Effacement (%): 50 ?Exam by:: LFatima Blank CNM ? ?FHT:  Baseline 135 , moderate variability, accelerations present, no decelerations ?Contractions: q 4-8 mins, irregular, mild to palpation ?  ?Labs: ?No results found for this or any previous visit (from the past 24 hour(s)). ?A/Negative/-- (09/12 1116) ? ?Imaging:  ? ?MAU Course/MDM: ?Orders Placed This Encounter  ?Procedures  ? CBC  ? RPR  ? Diet clear liquid Room service appropriate? Yes; Fluid consistency: Thin  ? Vitals signs per unit policy  ? Notify physician (specify)  ? Fetal monitoring per unit policy  ? Activity as tolerated  ? Cervical Exam  ? Measure blood pressure post delivery every 15 min x 1 hour then every 30 min x 1 hour  ? Fundal check post delivery every 15 min x 1 hour then every 30 min x 1 hour  ? If Rapid HIV test positive or known HIV positive: initiate AZT orders  ? May in and out cath x 2 for inability to void  ? Insert urethral catheter X 1 PRN If Coude Catheter is chosen,  qualified resources by campus can be found in the clinical skills nursing procedure for Coude Catheter 1. If straight catheterized > 2 times or patient unable to void post epidural plac...  ? Refer to Sidebar Report Urinary (Foley) Catheter Indications  ? Refer to Sidebar Report Post Indwelling Urinary Catheter Removal and Intervention Guidelines  ? Discontinue foley prior to vaginal delivery  ? Initiate Carrier Fluid Protocol  ? Initiate Oral Care Protocol  ? SCDs  ? Patient may have epidural placement upon request  ? SCDs  ? Evaluate fetal heart rate to establish reassuring pattern prior to initiating Cytotec or Pitocin  ? Perform a cervical exam prior to initiating Cytotec or Pitocin  ? Discontinue Pitocin if tachysystole with non-reassuring  FHR is present  ? Nofify MD/CNM if tachysystole with non-reassuring FHR is present  ? Initiate intrauterine resuscitation if tachysystole with non-reasuring FHR is present  ? If tachysystole WITH reassuring FHR present notify MD / CNM  ? May administer Terbutaline 0.25 mg SQ x 1 dose if tachysystole with non-reassuring FHR is presesnt  ? Labor Induction  ? Notify physician (specify)  ? Full code  ? Type and screen  ? Insert and maintain IV Line  ? Admit to Inpatient (patient's expected length of stay will be greater than 2 midnights or inpatient only procedure)  ?  ?Meds ordered this encounter  ?Medications  ? lactated ringers infusion  ? oxytocin (PITOCIN) IV BOLUS FROM BAG  ? oxytocin (PITOCIN) IV infusion 30 units in NS 500 mL - Premix  ? lactated ringers infusion 500-1,000 mL  ? acetaminophen (TYLENOL) tablet 650 mg  ? oxyCODONE-acetaminophen (PERCOCET/ROXICET) 5-325 MG per tablet 1 tablet  ? ondansetron (ZOFRAN) injection 4 mg  ? sodium citrate-citric acid (ORACIT) solution 30 mL  ? lidocaine (PF) (XYLOCAINE) 1 % injection 30 mL  ? fentaNYL (SUBLIMAZE) injection 50-100 mcg  ? terbutaline (BRETHINE) injection 0.25 mg  ? DISCONTD: misoprostol (CYTOTEC) tablet 25 mcg  ?  DISCONTD: oxytocin (PITOCIN) IV infusion 30 units in NS 500 mL - Premix  ?  Order Specific Question:   Begin infusion at:  ?  Answer:   2 milli-units/min (2 mL/hr)  ?  Order Specific Question:   Increase infusion by:  ?

## 2021-05-27 NOTE — MAU Note (Signed)
Had sve yesterday at appt. Yesterday had some bloody show and then more dark bleeding with small clots and then it stopped. Also started having period like cramps which continues today. Still having dark bleeding with small clots and cramping with pelvic pressure. Reports some decreased FM today ?

## 2021-05-27 NOTE — MAU Note (Signed)
Pt reports SVE yesterday with cramping that followed throughout the day and evening. Today she reports loss of mucus plug and some bleeding with "clots" about the size of a pencil eraser. Has been feeling baby move less today. Also reports low back pain.  ?

## 2021-05-28 ENCOUNTER — Inpatient Hospital Stay (HOSPITAL_COMMUNITY): Payer: Medicaid Other | Admitting: Anesthesiology

## 2021-05-28 ENCOUNTER — Encounter (HOSPITAL_COMMUNITY): Payer: Self-pay | Admitting: Family Medicine

## 2021-05-28 DIAGNOSIS — O3663X0 Maternal care for excessive fetal growth, third trimester, not applicable or unspecified: Secondary | ICD-10-CM

## 2021-05-28 DIAGNOSIS — Z30014 Encounter for initial prescription of intrauterine contraceptive device: Secondary | ICD-10-CM

## 2021-05-28 DIAGNOSIS — Z3A38 38 weeks gestation of pregnancy: Secondary | ICD-10-CM

## 2021-05-28 DIAGNOSIS — O368130 Decreased fetal movements, third trimester, not applicable or unspecified: Secondary | ICD-10-CM

## 2021-05-28 DIAGNOSIS — O24424 Gestational diabetes mellitus in childbirth, insulin controlled: Secondary | ICD-10-CM

## 2021-05-28 LAB — GLUCOSE, CAPILLARY
Glucose-Capillary: 93 mg/dL (ref 70–99)
Glucose-Capillary: 99 mg/dL (ref 70–99)

## 2021-05-28 LAB — RPR: RPR Ser Ql: NONREACTIVE

## 2021-05-28 MED ORDER — FERROUS SULFATE 325 (65 FE) MG PO TABS: 325.0000 mg | ORAL_TABLET | ORAL | Status: DC

## 2021-05-28 MED ORDER — IBUPROFEN 600 MG PO TABS
600.0000 mg | ORAL_TABLET | Freq: Four times a day (QID) | ORAL | Status: DC
  Administered 2021-05-28 – 2021-05-29 (×3): 600 mg via ORAL
  Filled 2021-05-28 (×3): qty 1

## 2021-05-28 MED ORDER — SIMETHICONE 80 MG PO CHEW: 80.0000 mg | CHEWABLE_TABLET | ORAL | Status: DC | PRN

## 2021-05-28 MED ORDER — SENNOSIDES-DOCUSATE SODIUM 8.6-50 MG PO TABS
2.0000 | ORAL_TABLET | Freq: Every day | ORAL | Status: DC
  Filled 2021-05-28: qty 2

## 2021-05-28 MED ORDER — PRENATAL MULTIVITAMIN CH: 1.0000 | ORAL_TABLET | Freq: Every day | ORAL | Status: DC

## 2021-05-28 MED ORDER — DIPHENHYDRAMINE HCL 25 MG PO CAPS: 25.0000 mg | ORAL_CAPSULE | Freq: Four times a day (QID) | ORAL | Status: DC | PRN

## 2021-05-28 MED ORDER — OXYTOCIN-SODIUM CHLORIDE 30-0.9 UT/500ML-% IV SOLN
1.0000 m[IU]/min | INTRAVENOUS | Status: DC
  Administered 2021-05-28: 2 m[IU]/min via INTRAVENOUS
  Filled 2021-05-28: qty 500

## 2021-05-28 MED ORDER — MEDROXYPROGESTERONE ACETATE 150 MG/ML IM SUSP: 150.0000 mg | INTRAMUSCULAR | Status: DC | PRN

## 2021-05-28 MED ORDER — EPHEDRINE 5 MG/ML INJ: 10.0000 mg | INTRAVENOUS | Status: DC | PRN

## 2021-05-28 MED ORDER — COCONUT OIL OIL
1.0000 | TOPICAL_OIL | Status: DC | PRN
Start: 2021-05-28 — End: 2021-05-29

## 2021-05-28 MED ORDER — PHENYLEPHRINE 40 MCG/ML (10ML) SYRINGE FOR IV PUSH (FOR BLOOD PRESSURE SUPPORT)
80.0000 ug | PREFILLED_SYRINGE | INTRAVENOUS | Status: DC | PRN
  Filled 2021-05-28: qty 10

## 2021-05-28 MED ORDER — ONDANSETRON HCL 4 MG PO TABS: 4.0000 mg | ORAL_TABLET | ORAL | Status: DC | PRN

## 2021-05-28 MED ORDER — METHYLERGONOVINE MALEATE 0.2 MG/ML IJ SOLN: 0.2000 mg | INTRAMUSCULAR | Status: DC | PRN

## 2021-05-28 MED ORDER — MEASLES, MUMPS & RUBELLA VAC IJ SOLR: 0.5000 mL | Freq: Once | INTRAMUSCULAR | Status: DC

## 2021-05-28 MED ORDER — METHYLERGONOVINE MALEATE 0.2 MG PO TABS: 0.2000 mg | ORAL_TABLET | ORAL | Status: DC | PRN

## 2021-05-28 MED ORDER — PHENYLEPHRINE 40 MCG/ML (10ML) SYRINGE FOR IV PUSH (FOR BLOOD PRESSURE SUPPORT): 80.0000 ug | PREFILLED_SYRINGE | INTRAVENOUS | Status: DC | PRN

## 2021-05-28 MED ORDER — DIPHENHYDRAMINE HCL 50 MG/ML IJ SOLN
12.5000 mg | INTRAMUSCULAR | Status: DC | PRN
  Administered 2021-05-28: 12.5 mg via INTRAVENOUS
  Filled 2021-05-28: qty 1

## 2021-05-28 MED ORDER — WITCH HAZEL-GLYCERIN EX PADS: 1.0000 "application " | MEDICATED_PAD | CUTANEOUS | Status: DC | PRN

## 2021-05-28 MED ORDER — DOCUSATE SODIUM 100 MG PO CAPS
100.0000 mg | ORAL_CAPSULE | Freq: Two times a day (BID) | ORAL | Status: DC
  Administered 2021-05-29: 100 mg via ORAL
  Filled 2021-05-28: qty 1

## 2021-05-28 MED ORDER — PARAGARD INTRAUTERINE COPPER IU IUD
1.0000 | INTRAUTERINE_SYSTEM | Freq: Once | INTRAUTERINE | Status: AC
  Administered 2021-05-28: 1 via INTRAUTERINE
  Filled 2021-05-28: qty 1

## 2021-05-28 MED ORDER — DIBUCAINE (PERIANAL) 1 % EX OINT: 1.0000 "application " | TOPICAL_OINTMENT | CUTANEOUS | Status: DC | PRN

## 2021-05-28 MED ORDER — ACETAMINOPHEN 325 MG PO TABS: 650.0000 mg | ORAL_TABLET | ORAL | Status: DC | PRN

## 2021-05-28 MED ORDER — LIDOCAINE HCL (PF) 1 % IJ SOLN
INTRAMUSCULAR | Status: DC | PRN
  Administered 2021-05-28 (×2): 5 mL via EPIDURAL

## 2021-05-28 MED ORDER — FENTANYL-BUPIVACAINE-NACL 0.5-0.125-0.9 MG/250ML-% EP SOLN
12.0000 mL/h | EPIDURAL | Status: DC | PRN
  Administered 2021-05-28: 11 mL/h via EPIDURAL
  Filled 2021-05-28: qty 250

## 2021-05-28 MED ORDER — ONDANSETRON HCL 4 MG/2ML IJ SOLN: 4.0000 mg | INTRAMUSCULAR | Status: DC | PRN

## 2021-05-28 MED ORDER — LACTATED RINGERS IV SOLN
500.0000 mL | Freq: Once | INTRAVENOUS | Status: AC
  Administered 2021-05-28: 500 mL via INTRAVENOUS

## 2021-05-28 MED ORDER — BENZOCAINE-MENTHOL 20-0.5 % EX AERO
1.0000 "application " | INHALATION_SPRAY | CUTANEOUS | Status: DC | PRN
  Filled 2021-05-28: qty 56

## 2021-05-28 MED ORDER — TETANUS-DIPHTH-ACELL PERTUSSIS 5-2.5-18.5 LF-MCG/0.5 IM SUSY: 0.5000 mL | PREFILLED_SYRINGE | Freq: Once | INTRAMUSCULAR | Status: DC

## 2021-05-28 NOTE — Plan of Care (Signed)
Progressing , completed  ?

## 2021-05-28 NOTE — Progress Notes (Signed)
Labor Progress Note ?Connie Fisher is a 26 y.o. G2P1001 at [redacted]w[redacted]d who presented for early labor and vaginal bleeding.  ? ?S: Doing well. No concerns.  ? ?O:  ?BP 130/75   Pulse 89   Temp 97.9 ?F (36.6 ?C) (Oral)   Resp 16   Ht 5\' 3"  (1.6 m)   Wt (!) 141.5 kg   LMP 09/01/2020   BMI 55.27 kg/m?  ? ?EFM: Baseline 135 bpm, moderate variability, + accels, no decels  ?Toco: Every 2-5 minutes  ? ?CVE: Dilation: 5 ?Effacement (%): 70 ?Station: -2 ?Presentation: Vertex ?Exam by:: [email or web address], RN ? ?A&P: 26 y.o. G2P1001 [redacted]w[redacted]d  ? ?#Labor: Progressing well. Contraction pattern still somewhat irregular. Will start Pitocin 2x2 and titrate to adequate pattern with contractions every 2-3 minutes. Plan to reassess in 4 hours, sooner as needed.  ?#Pain: PRN; planning for epidural  ?#FWB: Cat 1  ?#GBS negative ? ?#Vaginal bleeding: Improved. Suspect likely in the setting of cervical change. Will continue to monitor.  ? ?#A1GDM: CBGs remain at goal. Will continue q4hr glucose checks in latent phase.  ? ?[redacted]w[redacted]d, MD ?6:25 AM ? ?

## 2021-05-28 NOTE — Discharge Summary (Signed)
? ?  Postpartum Discharge Summary ? ?   ?Patient Name: Connie Fisher ?DOB: 04/15/1995 ?MRN: [id number] ? ?Date of admission: 05/27/2021 ?Delivery date:05/28/2021  ?Delivering provider: Maryagnes Amos L  ?Date of discharge: 05/29/2021 ? ?Admitting diagnosis: Normal labor [O80, Z37.9] ?Intrauterine pregnancy: [redacted]w[redacted]d    ?Secondary diagnosis:  Active Problems: ?  M[place] morbid obesity, antepartum ([place] ?  Rh negative state in antepartum period ?  LGA (large for gestational age) fetus affecting management of mother ?  SVD (spontaneous vaginal delivery) ? ?Additional problems: none    ?Discharge diagnosis: Term Pregnancy Delivered, GDM A1, and Rh negative                                               ?Post partum procedures: postplacental Paragard placement ?Augmentation: AROM and Pitocin ?Complications: None ? ?Hospital course: Onset of Labor With Vaginal Delivery      ?26y.o. yo G2P1001 at 385w3das admitted in Latent Labor on 05/27/2021. Patient had an uncomplicated labor course as follows:  ?Membrane Rupture Time/Date: 1:44 AM ,05/28/2021   ?Delivery Method:Vaginal, Spontaneous  ?Episiotomy: None  ?Lacerations:  1st degree  ?Patient had an uncomplicated postpartum course including postplacental Paragard placement (see Delivery note).  She is ambulating, tolerating a regular diet, passing flatus, and urinating well. Her fasting CBG on PPD#1 was 99. Patient is discharged home in stable condition on 05/29/21 per her request for early d/c as long as the baby can go as well. ? ?Newborn Data: ?Birth date:05/28/2021  ?Birth time:10:56 AM  ?Gender:Female  ?Living status:Living  ?Apgars:9 ,10  ?Weight:3740 g (8lb 3.9oz) ? ?Magnesium Sulfate received: No ?BMZ received: No ?Rhophylac:No (mom and baby both Rh neg) ?MMR:N/A ?T-DaP:Given prenatally ?Flu: No ?Transfusion:No ? ?Physical exam  ?Vitals:  ? 05/28/21 1745 05/28/21 2035 05/28/21 2259 05/29/21 0530  ?BP: (!) 121/59 121/70  111/73  ?Pulse: (!) 108 94 100 77  ?Resp: '18 16 16  18  ' ?Temp: 98.4 ?F (36.9 ?C) 98.7 ?F (37.1 ?C) 98.3 ?F (36.8 ?C) 97.9 ?F (36.6 ?C)  ?TempSrc: Oral Oral  Oral  ?SpO2:  100%  99%  ?Weight:      ?Height:      ? ?General: alert and cooperative ?Lochia: appropriate ?Uterine Fundus: firm ?Incision: N/A ?DVT Evaluation: No evidence of DVT seen on physical exam. ?Labs: ?Lab Results  ?Component Value Date  ? WBC 11.5 (H) 05/27/2021  ? HGB 12.0 05/27/2021  ? HCT 36.6 05/27/2021  ? MCV 88.6 05/27/2021  ? PLT 231 05/27/2021  ? ? ?  Latest Ref Rng & Units 12/14/2020  ? 10:57 AM  ?CMP  ?Glucose 70 - 99 mg/dL 89    ?BUN 6 - 20 mg/dL 5    ?Creatinine 0.57 - 1.00 mg/dL 0.52    ?Sodium 134 - 144 mmol/L 139    ?Potassium 3.5 - 5.2 mmol/L 4.1    ?Chloride 96 - 106 mmol/L 104    ?CO2 20 - 29 mmol/L 21    ?Calcium 8.7 - 10.2 mg/dL 8.9    ?Total Protein 6.0 - 8.5 g/dL 6.4    ?Total Bilirubin 0.0 - 1.2 mg/dL <0.2    ?Alkaline Phos 44 - 121 IU/L 68    ?AST 0 - 40 IU/L 11    ?ALT 0 - 32 IU/L 8    ? ?Edinburgh Score: ?   ? View :  No data to display.  ?  ?  ?  ? ? ? ?After visit meds:  ?Allergies as of 05/29/2021   ?No Known Allergies ?  ? ?  ?Medication List  ?  ? ?STOP taking these medications   ? ?Accu-Chek Guide test strip ?Generic drug: glucose blood ?  ?Accu-Chek Guide w/Device Kit ?  ?Accu-Chek Softclix Lancets lancets ?  ?aspirin EC 81 MG tablet ?  ?Blood Pressure Kit Devi ?  ?cyclobenzaprine 10 MG tablet ?Commonly known as: FLEXERIL ?  ?Go[place]ji Weight Scale Misc ?  ?Magnesium Oxide 200 MG Tabs ?Commonly known as: Mag-Oxide ?  ? ?  ? ?TAKE these medications   ? ?ibuprofen 600 MG tablet ?Commonly known as: ADVIL ?Take 1 tablet (600 mg total) by mouth every 6 (six) hours as needed. ?  ?PRENATAL GUMMIES PO ?Take by mouth. ?  ? ?  ? ? ? ?Discharge home in stable condition ?Infant Feeding: Breast ?Infant Disposition:home with mother ?Discharge instruction: per After Visit Summary and Postpartum booklet. ?Activity: Advance as tolerated. Pelvic rest for 6 weeks.  ?Diet: routine diet ?Future  Appointments: ?Future Appointments  ?Date Time Provider [place]  ?07/14/2021  9:15 AM [facility] None  ?07/14/2021 10:15 AM Griffin Basil, MD [facility] None  ? ?Follow up Visit: ? ?Message sent to [place] on 3/24 ? ?Please schedule this patient for a In person postpartum visit in 6 weeks with the following provider: Any provider. ?Additional Postpartum F/U:2 hour GTT  ?High risk pregnancy complicated by: GDM ?Delivery mode:  Vaginal, Spontaneous  ?Anticipated Birth Control:  PP IUD placed ? ? ?05/29/2021 ?Myrtis Ser, CNM ?8:47 AM ? ? ? ? ?

## 2021-05-28 NOTE — Progress Notes (Signed)
Labor Progress Note ?Connie Fisher is a 26 y.o. G2P1001 at [redacted]w[redacted]d who presented for early labor and vaginal bleeding.  ? ?S: Doing well s/p epidural feeling comfortable ? ?O:  ?BP (!) 120/57   Pulse 85   Temp 98.1 ?F (36.7 ?C) (Oral)   Resp 16   Ht 5\' 3"  (1.6 m)   Wt (!) 141.5 kg   LMP 09/01/2020   SpO2 99%   BMI 55.27 kg/m?  ? ?EFM: Baseline 130 bpm, moderate variability, + accels, no decels  ?Toco: Every 2-5 minutes  ? ?CVE: Dilation: 5.5 ?Effacement (%): 80 ?Station: -1 ?Presentation: Vertex ?Exam by:: [email or web address] Rn ? ?A&P: 26 y.o. G2P1001 [redacted]w[redacted]d  ? ?#Labor: Progressing well, Pitocin at  6 mL/hr will uptitrate as necessary. Plan to reassess in 4 hours, sooner as needed.  ?#Pain: PRN; planning for epidural  ?#FWB: Cat 1  ?#GBS negative ? ?#Vaginal bleeding: Likely in the setting of cervical change. Will continue to monitor.  ? ?#A1GDM: CBGs below 100.  ?-q4hr CBGs ? ?[redacted]w[redacted]d, MD ?10:03 AM ? ?

## 2021-05-28 NOTE — Progress Notes (Signed)
Labor Progress Note ?Connie Fisher is a 26 y.o. G2P1001 at [redacted]w[redacted]d who presented for early labor and vaginal bleeding.  ? ?S: Doing well. No concerns. Family at bedside.  ? ?O:  ?BP 134/84   Pulse (!) 104   Temp 97.9 ?F (36.6 ?C) (Oral)   Resp 18   Ht 5\' 3"  (1.6 m)   Wt (!) 141.5 kg   LMP 09/01/2020   BMI 55.27 kg/m?  ? ?EFM: Baseline 140 bpm, moderate variability, + accels, no decels  ?Toco: Every 3-5 minutes  ? ?CVE: Dilation: 4.5 ?Effacement (%): 50 ?Station: -3 ?Presentation: Vertex ?Exam by:: [email or web address], MD ? ?A&P: 26 y.o. G2P1001 [redacted]w[redacted]d  ? ?#Labor: Progressing. Fetal head well applied. AROM discussed and patient verbally consented. AROM performed with clear fluid. Mom and baby tolerated this well. Will continue to manage expectantly for now and reassess in 4 hours. Can consider adding Pitocin if unchanged on next exam.  ?#Pain: PRN; planning for epidural when ready  ?#FWB: Cat 1  ?#GBS negative ? ?#A1GDM: CBGs within normal range. Will continue to assess every 4 hours.  ? ?[redacted]w[redacted]d, MD ?2:44 AM ? ?

## 2021-05-28 NOTE — Anesthesia Preprocedure Evaluation (Signed)
Anesthesia Evaluation  ?Patient identified by MRN, date of birth, ID band ?Patient awake ? ? ? ?Reviewed: ?Allergy & Precautions, Patient's Chart, lab work & pertinent test results ? ?Airway ?Mallampati: II ? ?TM Distance: >3 FB ?Neck ROM: Full ? ? ? Dental ?no notable dental hx. ?(+) Teeth Intact ?  ?Pulmonary ?neg pulmonary ROS,  ?  ?Pulmonary exam normal ?breath sounds clear to auscultation ? ? ? ? ? ? Cardiovascular ?negative cardio ROS ?Normal cardiovascular exam ?Rhythm:Regular Rate:Normal ? ? ?  ?Neuro/Psych ?negative neurological ROS ? negative psych ROS  ? GI/Hepatic ?Neg liver ROS, GERD  ,  ?Endo/Other  ?diabetes, Well Controlled, GestationalMorbid obesity ? Renal/GU ?negative Renal ROS  ?negative genitourinary ?  ?Musculoskeletal ?negative musculoskeletal ROS ?(+)  ? Abdominal ?(+) + obese,   ?Peds ? Hematology ?negative hematology ROS ?(+)   ?Anesthesia Other Findings ? ? Reproductive/Obstetrics ?(+) Pregnancy ?LGA ? ?  ? ? ? ? ? ? ? ? ? ? ? ? ? ?  ?  ? ? ? ? ? ? ? ? ?Anesthesia Physical ?Anesthesia Plan ? ?ASA: 3 ? ?Anesthesia Plan: Epidural  ? ?Post-op Pain Management:   ? ?Induction:  ? ?PONV Risk Score and Plan:  ? ?Airway Management Planned: Natural Airway ? ?Additional Equipment:  ? ?Intra-op Plan:  ? ?Post-operative Plan:  ? ?Informed Consent: I have reviewed the patients History and Physical, chart, labs and discussed the procedure including the risks, benefits and alternatives for the proposed anesthesia with the patient or authorized representative who has indicated his/her understanding and acceptance.  ? ? ? ? ? ?Plan Discussed with: Anesthesiologist ? ?Anesthesia Plan Comments:   ? ? ? ? ? ? ?Anesthesia Quick Evaluation ? ?

## 2021-05-28 NOTE — Anesthesia Procedure Notes (Signed)
Epidural ?Patient location during procedure: OB ?Start time: 05/28/2021 7:51 AM ?End time: 05/28/2021 8:00 AM ? ?Staffing ?Anesthesiologist: Mal Amabile, MD ?Performed: anesthesiologist  ? ?Preanesthetic Checklist ?Completed: patient identified, IV checked, site marked, risks and benefits discussed, surgical consent, monitors and equipment checked, pre-op evaluation and timeout performed ? ?Epidural ?Patient position: sitting ?Prep: DuraPrep and site prepped and draped ?Patient monitoring: continuous pulse ox and blood pressure ?Approach: midline ?Location: L3-L4 ?Injection technique: LOR air ? ?Needle:  ?Needle type: Tuohy  ?Needle gauge: 17 G ?Needle length: 9 cm and 9 ?Needle insertion depth: 8 cm ?Catheter type: closed end flexible ?Catheter size: 19 Gauge ?Catheter at skin depth: 13 cm ?Test dose: negative and Other ? ?Assessment ?Events: blood not aspirated, injection not painful, no injection resistance, no paresthesia and negative IV test ? ?Additional Notes ?Patient identified. Risks and benefits discussed including failed block, incomplete  ?Pain control, post dural puncture headache, nerve damage, paralysis, blood pressure ?Changes, nausea, vomiting, reactions to medications-both toxic and allergic and post ?Partum back pain. All questions were answered. Patient expressed understanding and wished to proceed. Sterile technique was used throughout procedure. Epidural site was ?Dressed with sterile barrier dressing. No paresthesias, signs of intravascular injection ?Or signs of intrathecal spread were encountered.  ?Patient was more comfortable after the epidural was dosed. ?Please see RN's note for documentation of vital signs and FHR which are stable. ?Reason for block:procedure for pain ? ? ? ?

## 2021-05-28 NOTE — Lactation Note (Addendum)
This note was copied from a baby's chart. ?Lactation Consultation Note ? ?Patient Name: Girl Audreena Sachdeva ?Today's Date: 05/28/2021 ?Reason for consult: Initial assessment;Mother's request;Early term 37-38.6wks;Breastfeeding assistance;Maternal endocrine disorder ?Age:26 hours ? ?Mom recent feeding prior to [facility] arrival latching for 10 min at 15 00. First glucose, came back normal.  ?Plan 1. To feed based on cues 8-12x 24hr period. Mom to offer breasts and look for signs of milk transfer.  ?2. If unable to latch, Mom to hand express and offer EBM via spoon 5-7 ml per feeding.  ?3. I and O sheet reviewed.  ?All questions answered at the end of the visit.  ? ?Mom to call for latch assistance with next feeding.  ? ?Maternal Data ?Has patient been taught Hand Expression?: Yes ?Does the patient have breastfeeding experience prior to this delivery?: Yes ?How long did the patient breastfeed?: 3 months pumped and bottle fed switched to formula ? ?Feeding ?Mother's Current Feeding Choice: Breast Milk ? ?LATCH Score ?  ? ?  ? ?  ? ?  ? ?  ? ?  ? ? ?Lactation Tools Discussed/Used ?  ? ?Interventions ?Interventions: Breast feeding basics reviewed;Skin to skin;Breast massage;Hand express;Breast compression;Position options;Expressed milk;Education;Theme park manager brochure ? ?Discharge ?Pump: Personal ?WIC Program: Yes ? ?Consult Status ?Consult Status: Follow-up ?Date: 05/29/21 ?Follow-up type: In-patient ? ? ? ?Ludie Hudon  Nicholson-Springer ?05/28/2021, 4:08 PM ? ? ? ?

## 2021-05-29 MED ORDER — IBUPROFEN 600 MG PO TABS: 600.0000 mg | ORAL_TABLET | Freq: Four times a day (QID) | ORAL | 0 refills | Status: DC | PRN

## 2021-05-29 NOTE — Anesthesia Postprocedure Evaluation (Signed)
Anesthesia Post Note ? ?Patient: Connie Fisher ? ?Procedure(s) Performed: AN AD HOC LABOR EPIDURAL ? ?  ? ?Patient location during evaluation: Mother Baby ?Anesthesia Type: Epidural ?Level of consciousness: awake and alert ?Pain management: pain level controlled ?Vital Signs Assessment: post-procedure vital signs reviewed and stable ?Respiratory status: spontaneous breathing, nonlabored ventilation and respiratory function stable ?Cardiovascular status: stable ?Postop Assessment: no headache, no backache and epidural receding ?Anesthetic complications: no ? ? ?No notable events documented. ? ?Last Vitals:  ?Vitals:  ? 05/28/21 2259 05/29/21 0530  ?BP:  111/73  ?Pulse: 100 77  ?Resp: 16 18  ?Temp: 36.8 ?C 36.6 ?C  ?SpO2:  99%  ?  ?Last Pain:  ?Vitals:  ? 05/29/21 0530  ?TempSrc: Oral  ?PainSc: 0-No pain  ? ?Pain Goal: Patients Stated Pain Goal: 0 (05/27/21 1913) ? ?  ?  ?  ?  ?  ?  ?  ? ?Dartha Rozzell ? ? ? ? ?

## 2021-05-29 NOTE — Lactation Note (Signed)
This note was copied from a baby's chart. ?Lactation Consultation Note ? ?Patient Name: Connie Fisher ?Today's Date: 05/29/2021 ?Reason for consult: Follow-up assessment;Early term 37-38.6wks ?Age:26 years ? ?LC in to room prior to discharge.  Baby is sleeping upon arrival. Mother reports latching, formula feeding and plans of pumping.  ?Reviewed pace bottle-feeding, upright position and frequent burping and provided volume guidelines for formula feeding. ?Discussed normal behavior and patterns after 24h, voids and stools as signs good intake, pumping, clusterfeeding, skin to skin. Talked about milk coming into volume and managing engorgement.  ? ?Plan: ?1-Feeding on demand or 8-12 times in 24h period. ?2-Hand express/pump as needed ?3-If using formula, follow volume guidelines and paced bottle feeding.  ?3-Encouraged maternal rest, hydration and food intake.  ? ?Contact LC as needed for feeds/support/concerns/questions. All questions answered at this time. Reviewed LC brochure.   ? ? ?Feeding ?Mother's Current Feeding Choice: Breast Milk and Formula ?Nipple Type: Slow - flow ? ?Interventions ?Interventions: Breast feeding basics reviewed;Skin to skin;Expressed milk;Education;Pace feeding;LC Services brochure ? ?Discharge ?Discharge Education: Engorgement and breast care;Warning signs for feeding baby ?Pump: Personal ? ?Consult Status ?Consult Status: Complete ?Date: 05/29/21 ?Follow-up type: Call as needed ? ? ? ?Connie Fisher ?05/29/2021, 12:57 PM ? ? ? ?

## 2021-05-30 LAB — BPAM RBC
Blood Product Expiration Date: 202304152359
Blood Product Expiration Date: 202304152359
Unit Type and Rh: 600
Unit Type and Rh: 600

## 2021-05-30 LAB — TYPE AND SCREEN
ABO/RH(D): A NEG
Antibody Screen: NEGATIVE
Unit division: 0
Unit division: 0

## 2021-05-31 ENCOUNTER — Telehealth: Payer: Self-pay

## 2021-05-31 NOTE — Telephone Encounter (Signed)
Transition Care Management Follow-up Telephone Call ?Date of discharge and from where: 05/29/2021 from [facility] ?How have you been since you were released from the hospital? Patient stated that she is feeling well and did not have any questions or concerns at this time.  ?Any questions or concerns? No ? ?Items Reviewed: ?Did the pt receive and understand the discharge instructions provided? Yes  ?Medications obtained and verified? Yes  ?Other? No  ?Any new allergies since your discharge? No  ?Dietary orders reviewed? No ?Do you have support at home? Yes  ? ?Functional Questionnaire: (I = Independent and D = Dependent) ?ADLs: I ? ?Bathing/Dressing- I ? ?Meal Prep- I ? ?Eating- I ? ?Maintaining continence- I ? ?Transferring/Ambulation- I ? ?Managing Meds- I ? ? ?Follow up appointments reviewed: ? ?PCP Hospital f/u appt confirmed? No   ?Specialist Hospital f/u appt confirmed? Yes  Scheduled to see Mariel Aloe, MD on 07/14/2021 @ 10:15am. ?Are transportation arrangements needed? No  ?If their condition worsens, is the pt aware to call PCP or go to the Emergency Dept.? Yes ?Was the patient provided with contact information for the PCP's office or ED? Yes ?Was to pt encouraged to call back with questions or concerns? Yes ? ?

## 2021-06-01 ENCOUNTER — Inpatient Hospital Stay (HOSPITAL_COMMUNITY): Admission: AD | Admit: 2021-06-01 | Payer: Medicaid Other | Source: Home / Self Care | Admitting: Family Medicine

## 2021-06-01 ENCOUNTER — Inpatient Hospital Stay (HOSPITAL_COMMUNITY): Payer: Medicaid Other

## 2021-06-01 ENCOUNTER — Encounter (HOSPITAL_COMMUNITY): Payer: Medicaid Other

## 2021-06-05 DIAGNOSIS — Z419 Encounter for procedure for purposes other than remedying health state, unspecified: Secondary | ICD-10-CM | POA: Diagnosis not present

## 2021-06-08 ENCOUNTER — Telehealth (HOSPITAL_COMMUNITY): Payer: Self-pay | Admitting: *Deleted

## 2021-06-08 NOTE — Telephone Encounter (Signed)
Patient voiced no questions or concerns regarding her health at this time. EPDS=0. Patient voiced no questions or concerns regarding infant at this time. Patient reports infant sleeps in a bassinet on her back. RN reviewed ABCs of safe sleep. Patient verbalized understanding. Patient informed about hospital's virtual postpartum classes and support groups - declined email information at this time. Deforest Hoyles, RN, 06/08/21, 1704  ?

## 2021-07-05 DIAGNOSIS — Z419 Encounter for procedure for purposes other than remedying health state, unspecified: Secondary | ICD-10-CM | POA: Diagnosis not present

## 2021-07-14 ENCOUNTER — Other Ambulatory Visit: Payer: Medicaid Other

## 2021-07-14 ENCOUNTER — Ambulatory Visit: Payer: Medicaid Other | Admitting: Obstetrics and Gynecology

## 2021-08-05 DIAGNOSIS — Z419 Encounter for procedure for purposes other than remedying health state, unspecified: Secondary | ICD-10-CM | POA: Diagnosis not present

## 2021-09-04 DIAGNOSIS — Z419 Encounter for procedure for purposes other than remedying health state, unspecified: Secondary | ICD-10-CM | POA: Diagnosis not present

## 2021-10-05 DIAGNOSIS — Z419 Encounter for procedure for purposes other than remedying health state, unspecified: Secondary | ICD-10-CM | POA: Diagnosis not present

## 2021-11-05 DIAGNOSIS — Z419 Encounter for procedure for purposes other than remedying health state, unspecified: Secondary | ICD-10-CM | POA: Diagnosis not present

## 2021-12-05 DIAGNOSIS — Z419 Encounter for procedure for purposes other than remedying health state, unspecified: Secondary | ICD-10-CM | POA: Diagnosis not present

## 2022-01-05 DIAGNOSIS — Z419 Encounter for procedure for purposes other than remedying health state, unspecified: Secondary | ICD-10-CM | POA: Diagnosis not present

## 2022-02-04 DIAGNOSIS — Z419 Encounter for procedure for purposes other than remedying health state, unspecified: Secondary | ICD-10-CM | POA: Diagnosis not present

## 2022-03-07 DIAGNOSIS — Z419 Encounter for procedure for purposes other than remedying health state, unspecified: Secondary | ICD-10-CM | POA: Diagnosis not present

## 2022-04-07 DIAGNOSIS — Z419 Encounter for procedure for purposes other than remedying health state, unspecified: Secondary | ICD-10-CM | POA: Diagnosis not present

## 2022-05-06 DIAGNOSIS — Z419 Encounter for procedure for purposes other than remedying health state, unspecified: Secondary | ICD-10-CM | POA: Diagnosis not present

## 2022-06-06 DIAGNOSIS — Z419 Encounter for procedure for purposes other than remedying health state, unspecified: Secondary | ICD-10-CM | POA: Diagnosis not present

## 2022-07-06 DIAGNOSIS — Z419 Encounter for procedure for purposes other than remedying health state, unspecified: Secondary | ICD-10-CM | POA: Diagnosis not present

## 2022-08-06 DIAGNOSIS — Z419 Encounter for procedure for purposes other than remedying health state, unspecified: Secondary | ICD-10-CM | POA: Diagnosis not present

## 2022-09-05 DIAGNOSIS — Z419 Encounter for procedure for purposes other than remedying health state, unspecified: Secondary | ICD-10-CM | POA: Diagnosis not present

## 2022-10-06 DIAGNOSIS — Z419 Encounter for procedure for purposes other than remedying health state, unspecified: Secondary | ICD-10-CM | POA: Diagnosis not present

## 2022-11-06 DIAGNOSIS — Z419 Encounter for procedure for purposes other than remedying health state, unspecified: Secondary | ICD-10-CM | POA: Diagnosis not present

## 2022-11-12 DIAGNOSIS — K047 Periapical abscess without sinus: Secondary | ICD-10-CM | POA: Diagnosis not present

## 2022-11-12 DIAGNOSIS — G43911 Migraine, unspecified, intractable, with status migrainosus: Secondary | ICD-10-CM | POA: Diagnosis not present

## 2022-12-06 DIAGNOSIS — Z419 Encounter for procedure for purposes other than remedying health state, unspecified: Secondary | ICD-10-CM | POA: Diagnosis not present

## 2023-01-06 DIAGNOSIS — Z419 Encounter for procedure for purposes other than remedying health state, unspecified: Secondary | ICD-10-CM | POA: Diagnosis not present

## 2023-02-05 DIAGNOSIS — Z419 Encounter for procedure for purposes other than remedying health state, unspecified: Secondary | ICD-10-CM | POA: Diagnosis not present

## 2023-02-21 IMAGING — US US OB LIMITED
1 series · 9 of 9 positions shown · non-contrast
Comparison: none

[Series 1: us ob limited · 9 of 9 slices shown]
[im 1/9]
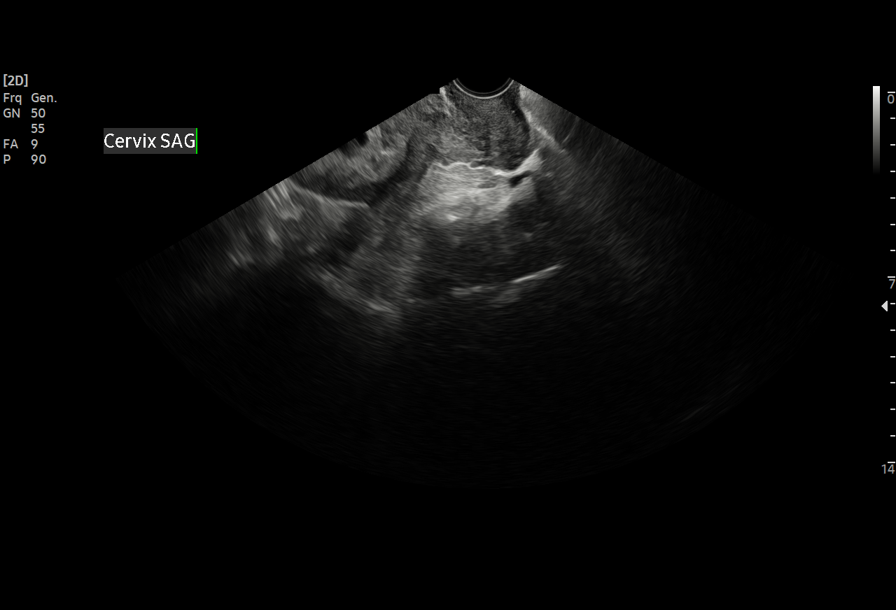
[im 2/9]
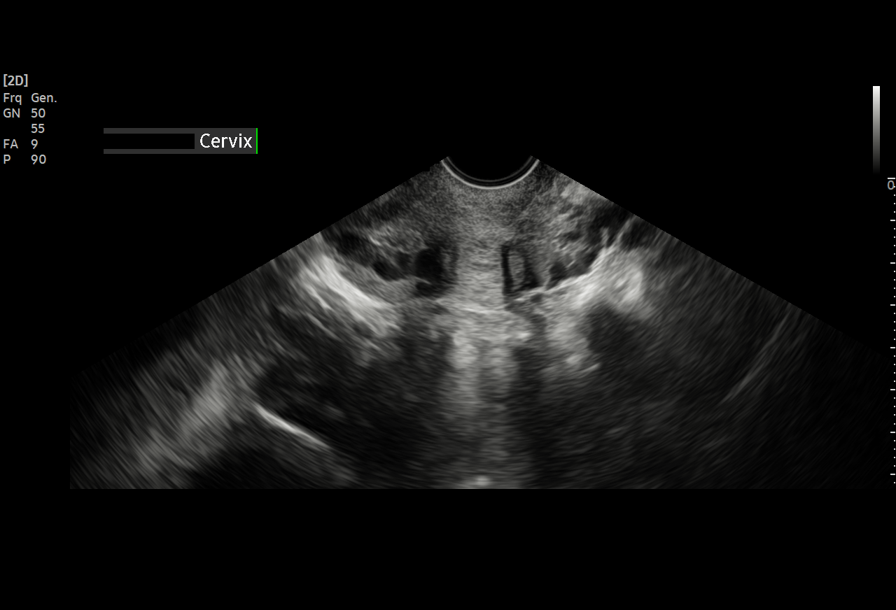
[im 3/9]
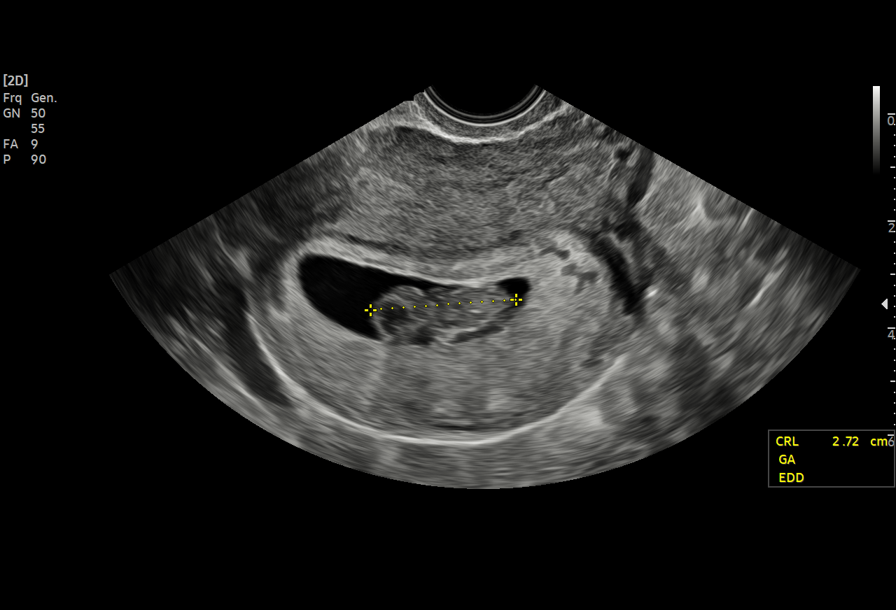
[im 4/9]
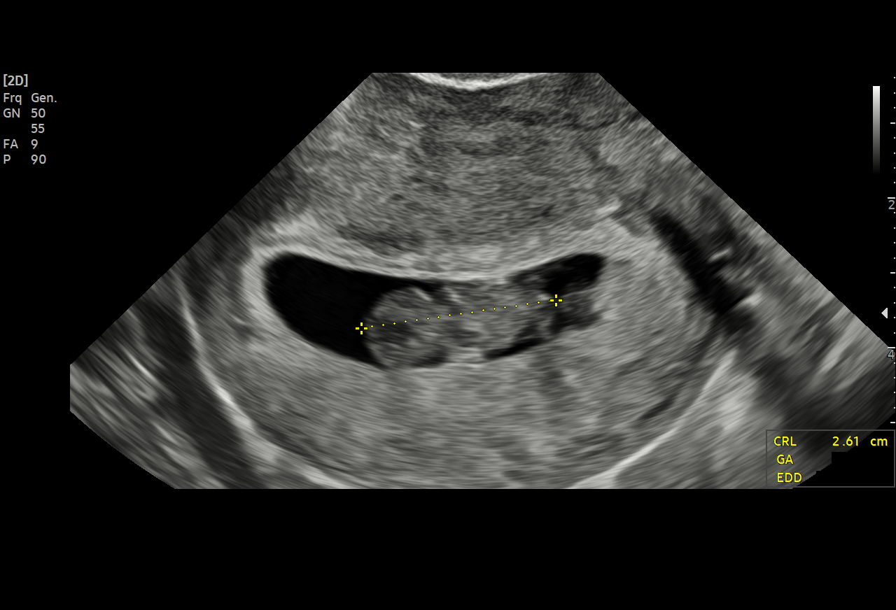
[im 5/9]
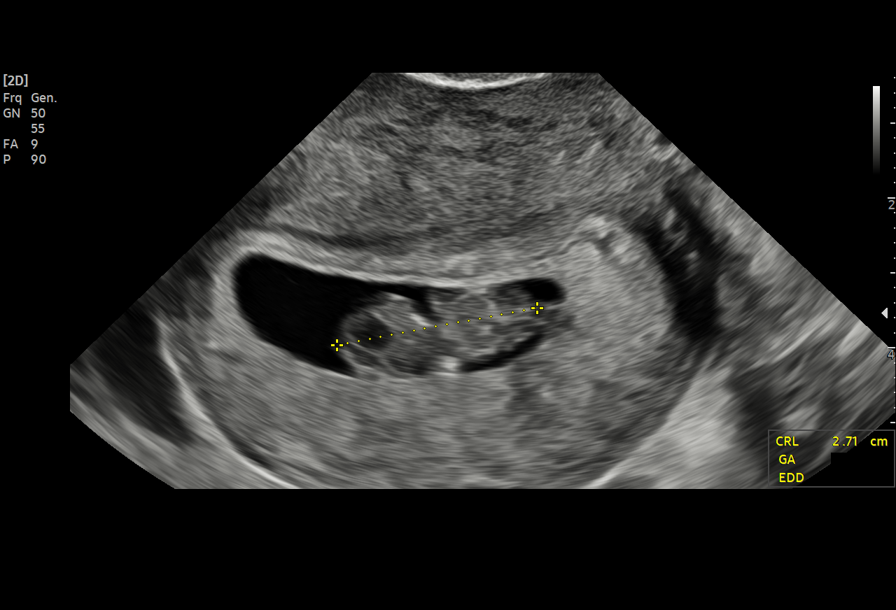
[im 6/9]
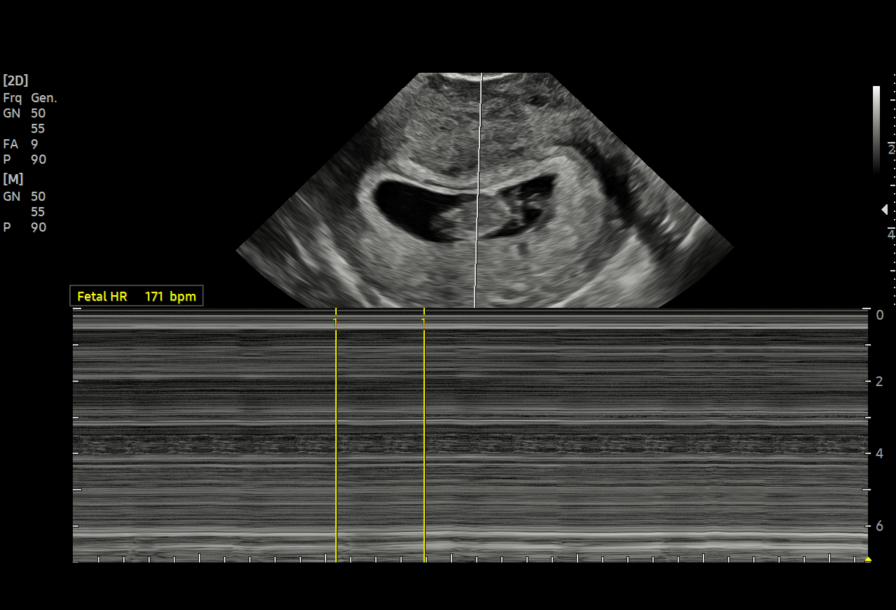
[im 7/9]
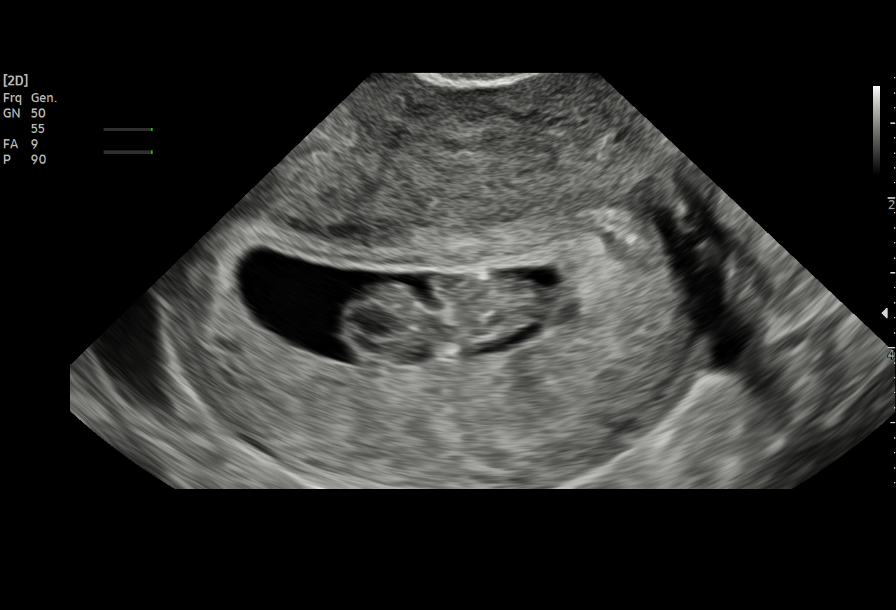
[im 8/9]
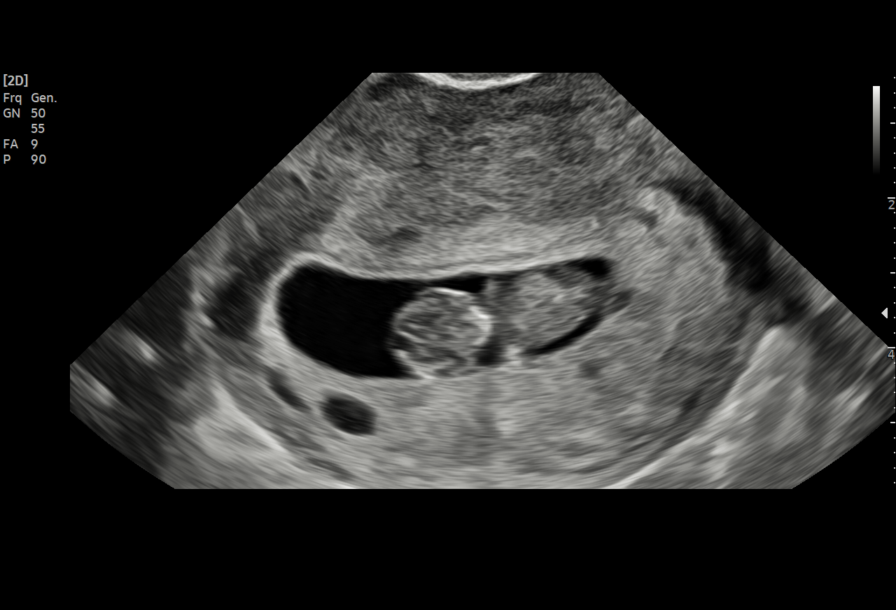
[im 9/9]
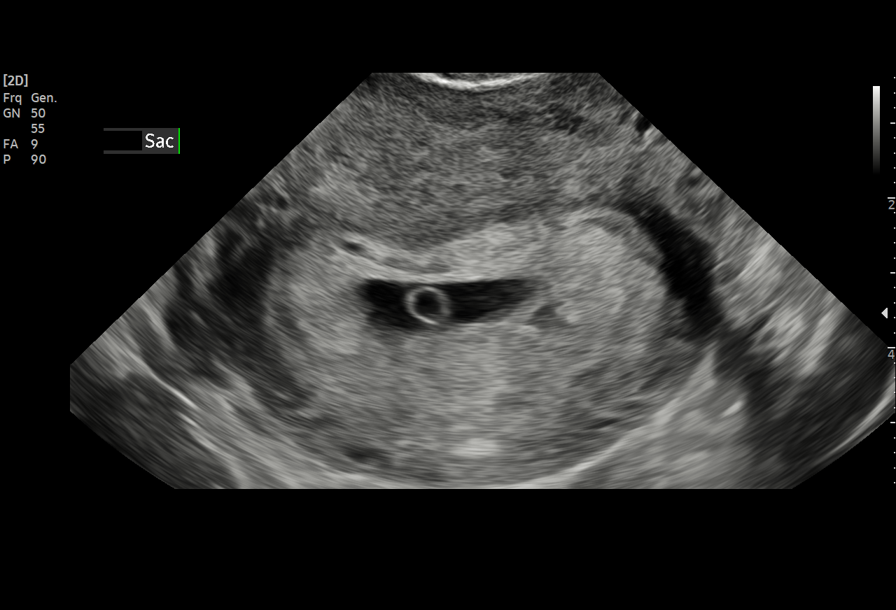

[9 of 9 positions shown; findings below may reference images not displayed]

[REDACTED]care

 1  [HOSPITAL]                         [address].0     KLPIGBB MOOLMAN

Indications

 9 weeks gestation of pregnancy
Fetal Evaluation

 Num Of Fetuses:         1
 Fetal Heart Rate(bpm):  171
 Cardiac Activity:       Observed
Biometry

 CRL:      26.8  mm     G. Age:  9w 2d                   EDD:   06/06/21
Gestational Age

 Best:          9w 2d      Det. By:  U/S C R L (11/03/20)     EDD:   06/06/21
Comments

 Single live IUP at 9w2d by CRL. Measurements are
 consistent with LMP 09/01/20 provided. Technically limited
 exam due to early Gestational Age.
Impression

 Viable intrauterine pregnancy
Recommendations

 Routine prenatal care
                Sandhu, Qin

## 2023-03-08 DIAGNOSIS — Z419 Encounter for procedure for purposes other than remedying health state, unspecified: Secondary | ICD-10-CM | POA: Diagnosis not present

## 2023-04-08 DIAGNOSIS — Z419 Encounter for procedure for purposes other than remedying health state, unspecified: Secondary | ICD-10-CM | POA: Diagnosis not present

## 2023-05-06 DIAGNOSIS — Z419 Encounter for procedure for purposes other than remedying health state, unspecified: Secondary | ICD-10-CM | POA: Diagnosis not present

## 2023-06-05 ENCOUNTER — Telehealth: Admitting: Physician Assistant

## 2023-06-05 DIAGNOSIS — K047 Periapical abscess without sinus: Secondary | ICD-10-CM | POA: Diagnosis not present

## 2023-06-05 MED ORDER — AMOXICILLIN-POT CLAVULANATE 875-125 MG PO TABS: 1.0000 | ORAL_TABLET | Freq: Two times a day (BID) | ORAL | 0 refills | Status: DC

## 2023-06-05 NOTE — Progress Notes (Signed)

## 2023-06-17 DIAGNOSIS — Z419 Encounter for procedure for purposes other than remedying health state, unspecified: Secondary | ICD-10-CM | POA: Diagnosis not present

## 2023-07-17 DIAGNOSIS — Z419 Encounter for procedure for purposes other than remedying health state, unspecified: Secondary | ICD-10-CM | POA: Diagnosis not present

## 2023-08-17 DIAGNOSIS — Z419 Encounter for procedure for purposes other than remedying health state, unspecified: Secondary | ICD-10-CM | POA: Diagnosis not present

## 2023-08-29 IMAGING — US US MFM OB FOLLOW-UP
1 series · 14 of 28 positions shown · non-contrast
Comparison: none

[Series 1: us mfm ob follow-up · 38 acquisitions, 14 frames shown]
[im 2/38]
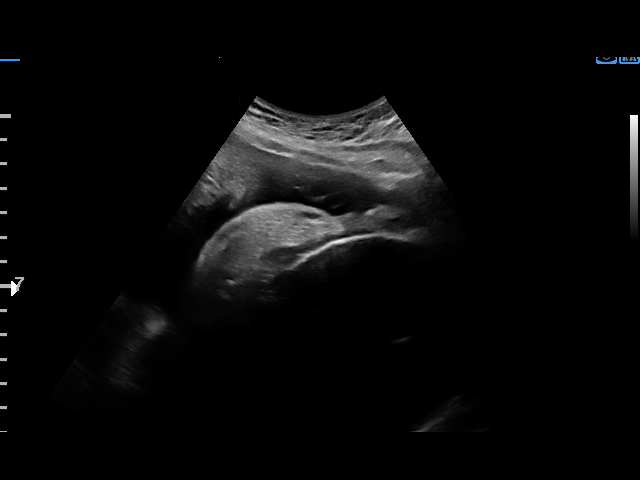
[im 5/38]
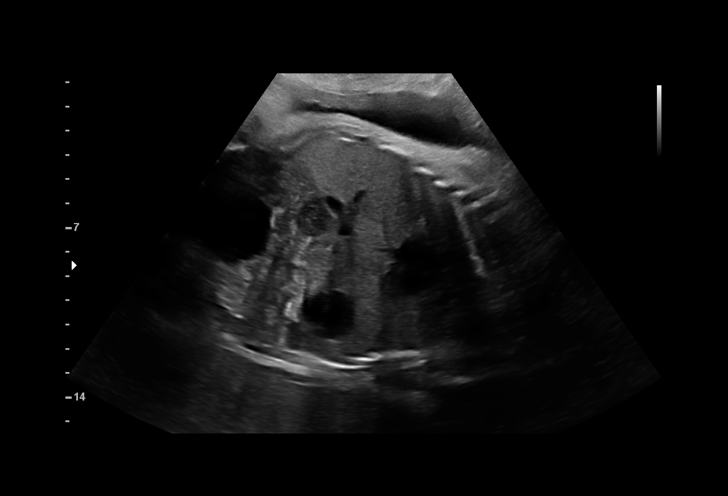
[im 7/38]
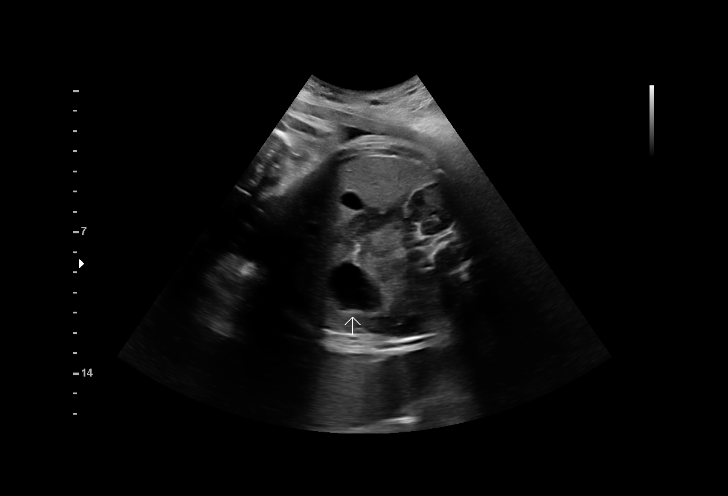
[im 10/38]
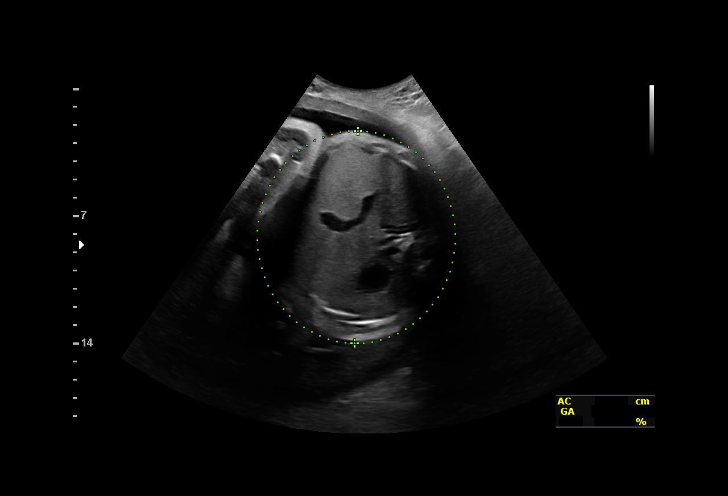
[im 13/38]
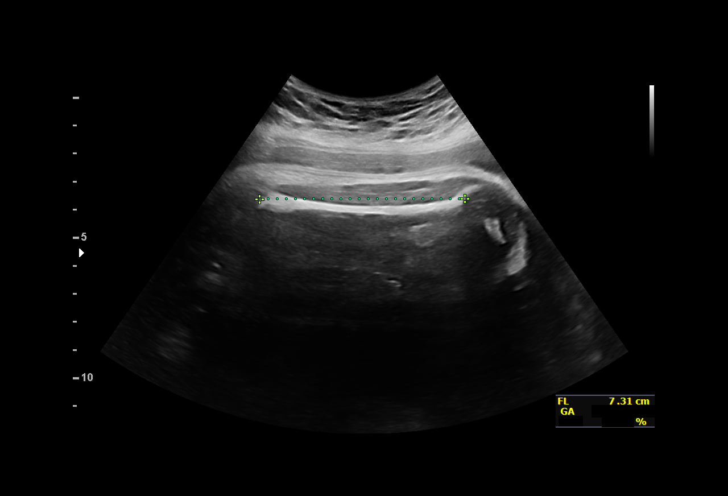
[im 16/38]
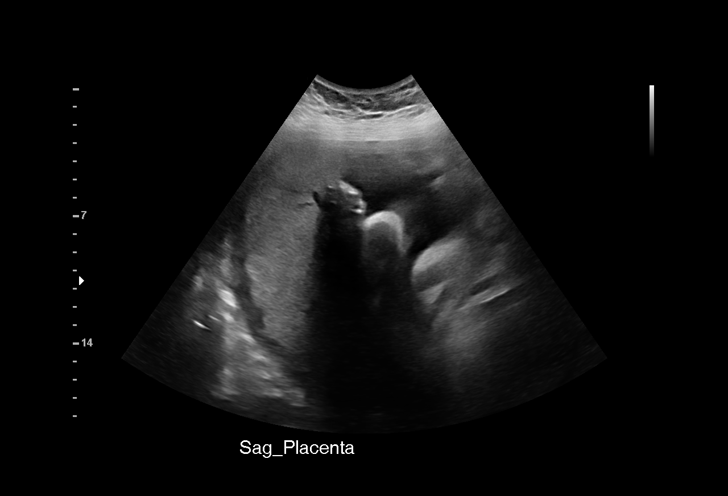
[im 18/38]
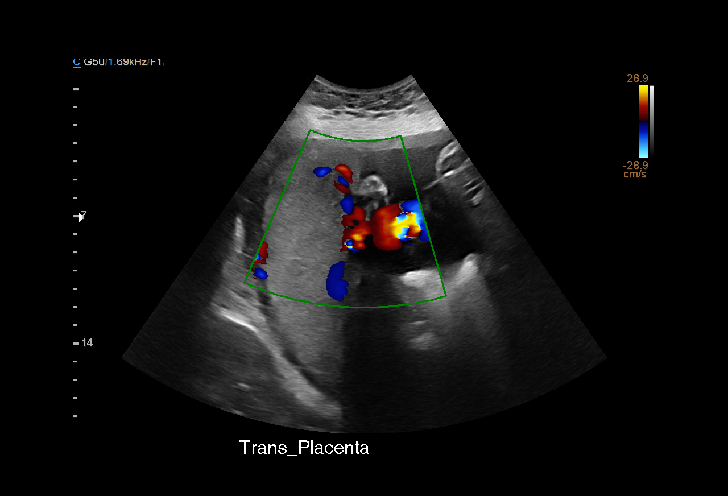
[im 21/38]
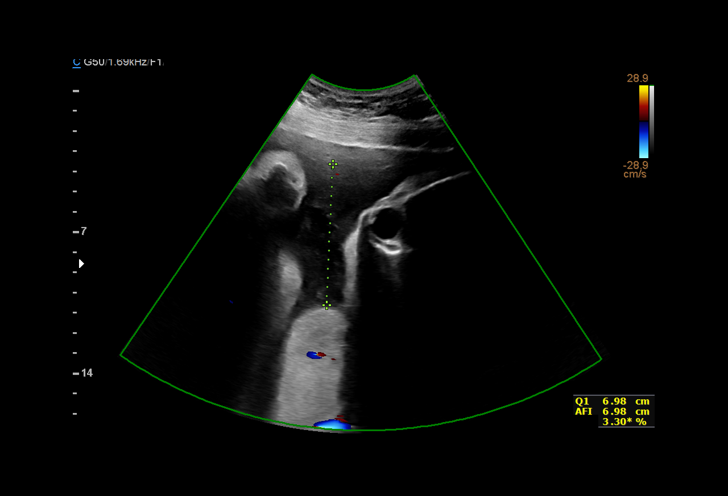
[im 24/38]
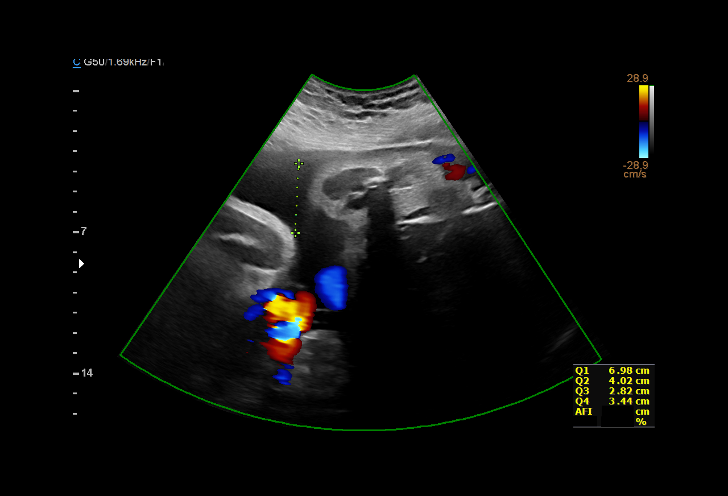
[im 27/38]
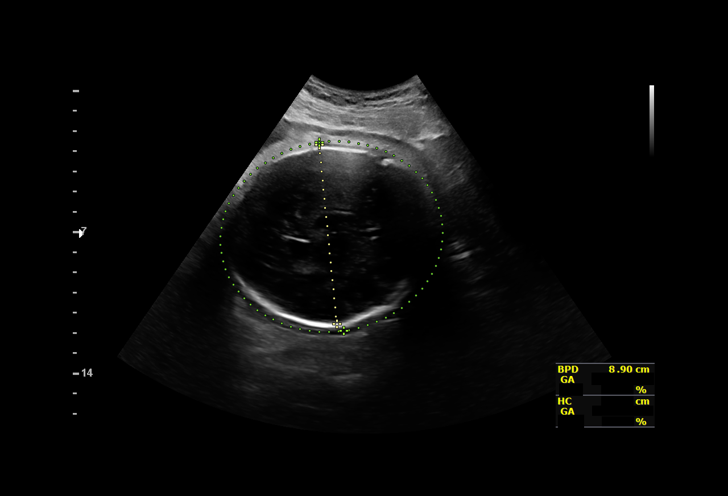
[im 29/38]
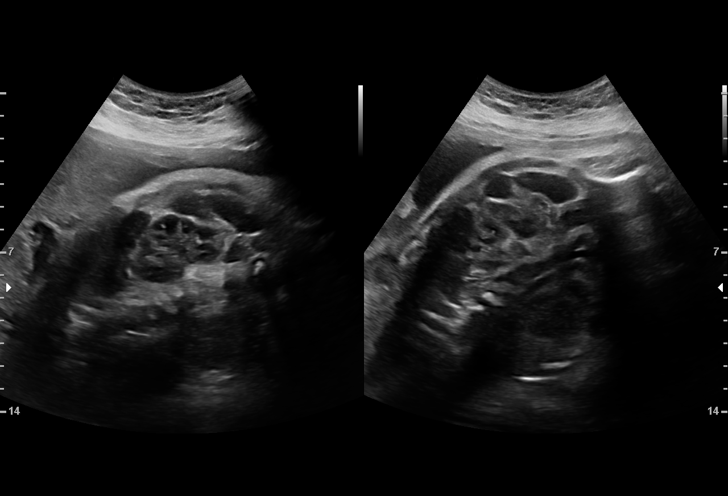
[im 32/38]
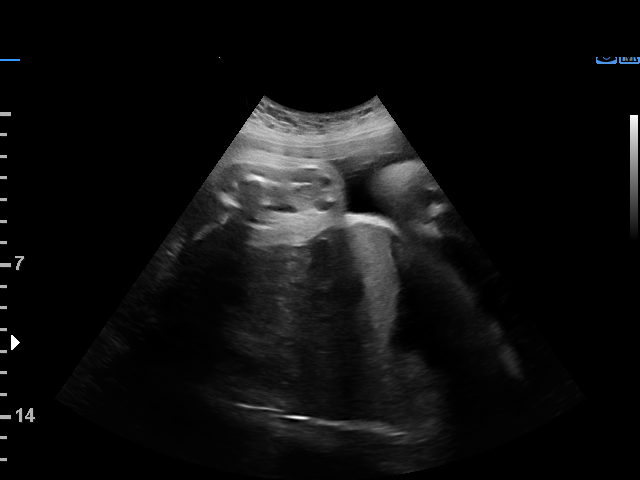
[im 35/38]
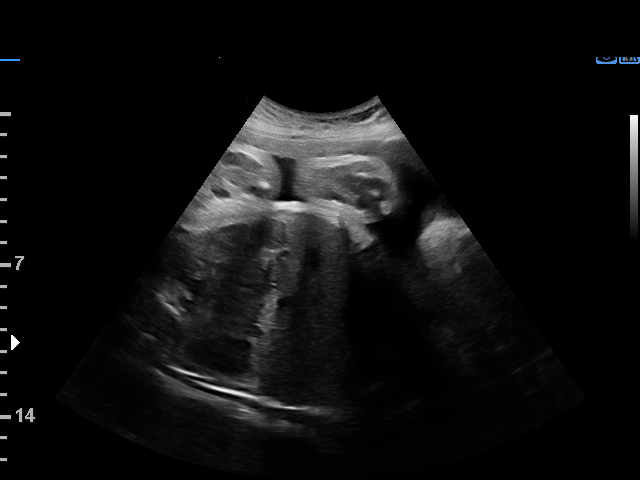
[im 38/38]
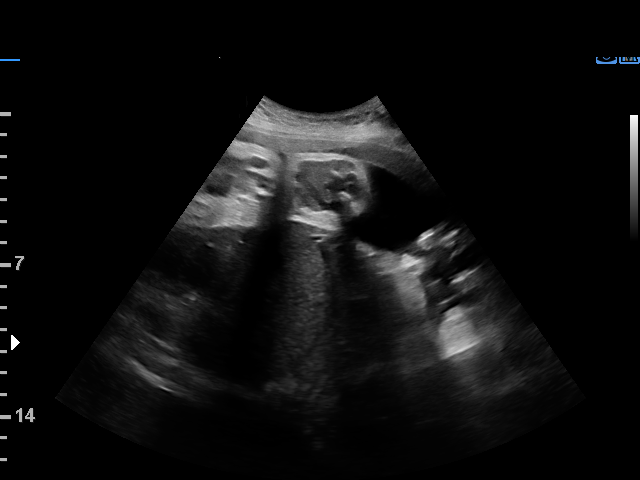

[14 of 28 positions shown; findings below may reference images not displayed]

Indications

 36 weeks gestation of pregnancy
 Obesity complicating pregnancy, third
 trimester (pregravid BMI 47)
 Gestational diabetes in pregnancy, diet
 controlled
 Genetic carrier (MCACDD)
 Low risk NIPS
 Rh negative state in antepartum
Fetal Evaluation

 Num Of Fetuses:         1
 Fetal Heart Rate(bpm):  150
 Cardiac Activity:       Observed
 Presentation:           Cephalic
 Placenta:               Posterior
 P. Cord Insertion:      Previously Visualized

 Amniotic Fluid
 AFI FV:      Within normal limits

 AFI Sum(cm)     %Tile       Largest Pocket(cm)
 17.26           64

 RUQ(cm)       RLQ(cm)       LUQ(cm)        LLQ(cm)

Biometry

 BPD:      88.1  mm     G. Age:  35w 4d         47  %    CI:        74.98   %    70 - 86
                                                         FL/HC:      22.8   %    20.1 -
 HC:      322.8  mm     G. Age:  36w 3d         30  %    HC/AC:      0.92        0.93 -
 AC:      351.2  mm     G. Age:  39w 0d       > 99  %    FL/BPD:     83.4   %    71 - 87
 FL:       73.5  mm     G. Age:  37w 4d         84  %    FL/AC:      20.9   %    20 - 24

 Est. FW:    6689  gm      7 lb 7 oz     94  %
OB History

 Gravidity:    2         Term:   1        Prem:   0        SAB:   0
 TOP:          0       Ectopic:  0        Living: 1
Gestational Age

 LMP:           36w 0d        Date:  09/01/20                 EDD:   06/08/21
 U/S Today:     37w 1d                                        EDD:   05/31/21
 Best:          36w 0d     Det. By:  LMP  (09/01/20)          EDD:   06/08/21
Anatomy

 Cranium:               Appears normal         Aortic Arch:            Previously seen
 Cavum:                 Previously seen        Ductal Arch:            Previously seen
 Ventricles:            Appears normal         Diaphragm:              Appears normal
 Choroid Plexus:        Previously seen        Stomach:                Appears normal, left
                                                                       sided
 Cerebellum:            Previously seen        Abdomen:                Appears normal
 Posterior Fossa:       Previously seen        Abdominal Wall:         Previously seen
 Nuchal Fold:           Not applicable (>20    Cord Vessels:           Previously seen
                        wks GA)
 Face:                  Orbits and profile     Kidneys:                Appear normal
                        previously seen
 Lips:                  Previously seen        Bladder:                Appears normal
 Thoracic:              Appears normal         Spine:                  Previously seen
 Heart:                 Previously seen        Upper Extremities:      Previously seen
 RVOT:                  Previously seen        Lower Extremities:      Previously seen
 LVOT:                  Previously seen
Impression

 Follow up growth due to elevated BMI >40 with known
 DAMQW
 Normal interval growth with measurements consistent with
 large for gestational .
 Good fetal movement and amniotic fluid volume
 Biophysical profile [DATE]
Recommendations

 Continue weekly testing
 Consider delivery between 39-40 weeks.

## 2023-09-16 DIAGNOSIS — Z419 Encounter for procedure for purposes other than remedying health state, unspecified: Secondary | ICD-10-CM | POA: Diagnosis not present

## 2023-10-02 ENCOUNTER — Telehealth: Admitting: Physician Assistant

## 2023-10-02 DIAGNOSIS — K047 Periapical abscess without sinus: Secondary | ICD-10-CM | POA: Diagnosis not present

## 2023-10-02 MED ORDER — AMOXICILLIN-POT CLAVULANATE 875-125 MG PO TABS: 1.0000 | ORAL_TABLET | Freq: Two times a day (BID) | ORAL | 0 refills | Status: DC

## 2023-10-02 MED ORDER — NAPROXEN 500 MG PO TABS: 500.0000 mg | ORAL_TABLET | Freq: Two times a day (BID) | ORAL | 0 refills | Status: AC

## 2023-10-02 NOTE — Progress Notes (Signed)
 E-Visit for Dental Pain  We are sorry that you are not feeling well.  Here is how we plan to help!  Based on what you have shared with me in the questionnaire, it sounds like you have a dental infection.   Augmentin 875-125mg  twice a day for 7 days and Naprosyn 500mg  2 times a day for 7 days for discomfort  It is imperative that you see a dentist within 10 days of this eVisit to determine the cause of the dental pain and be sure it is adequately treated  A toothache or tooth pain is caused when the nerve in the root of a tooth or surrounding a tooth is irritated. Dental (tooth) infection, decay, injury, or loss of a tooth are the most common causes of dental pain. Pain may also occur after an extraction (tooth is pulled out). Pain sometimes originates from other areas and radiates to the jaw, thus appearing to be tooth pain.Bacteria growing inside your mouth can contribute to gum disease and dental decay, both of which can cause pain. A toothache occurs from inflammation of the central portion of the tooth called pulp. The pulp contains nerve endings that are very sensitive to pain. Inflammation to the pulp or pulpitis may be caused by dental cavities, trauma, and infection.    HOME CARE:   For toothaches: Over-the-counter pain medications such as acetaminophen or ibuprofen may be used. Take these as directed on the package while you arrange for a dental appointment. Avoid very cold or hot foods, because they may make the pain worse. You may get relief from biting on a cotton ball soaked in oil of cloves. You can get oil of cloves at most drug stores.  For jaw pain:  Aspirin may be helpful for problems in the joint of the jaw in adults. If pain happens every time you open your mouth widely, the temporomandibular joint (TMJ) may be the source of the pain. Yawning or taking a large bite of food may worsen the pain. An appointment with your doctor or dentist will help you find the cause.      GET HELP RIGHT AWAY IF:  You have a high fever or chills If you have had a recent head or face injury and develop headache, light headedness, nausea, vomiting, or other symptoms that concern you after an injury to your face or mouth, you could have a more serious injury in addition to your dental injury. A facial rash associated with a toothache: This condition may improve with medication. Contact your doctor for them to decide what is appropriate. Any jaw pain occurring with chest pain: Although jaw pain is most commonly caused by dental disease, it is sometimes referred pain from other areas. People with heart disease, especially people who have had stents placed, people with diabetes, or those who have had heart surgery may have jaw pain as a symptom of heart attack or angina. If your jaw or tooth pain is associated with lightheadedness, sweating, or shortness of breath, you should see a doctor as soon as possible. Trouble swallowing or excessive pain or bleeding from gums: If you have a history of a weakened immune system, diabetes, or steroid use, you may be more susceptible to infections. Infections can often be more severe and extensive or caused by unusual organisms. Dental and gum infections in people with these conditions may require more aggressive treatment. An abscess may need draining or IV antibiotics, for example.  MAKE SURE YOU   Understand these  instructions. Will watch your condition. Will get help right away if you are not doing well or get worse.  Thank you for choosing an e-visit.  Your e-visit answers were reviewed by a board certified advanced clinical practitioner to complete your personal care plan. Depending upon the condition, your plan could have included both over the counter or prescription medications.  Please review your pharmacy choice. Make sure the pharmacy is open so you can pick up prescription now. If there is a problem, you may contact your provider  through [employer] and have the prescription routed to another pharmacy.  Your safety is important to [place]. If you have drug allergies check your prescription carefully.   For the next 24 hours you can use MyChart to ask questions about today's visit, request a non-urgent call back, or ask for a work or school excuse. You will get an email in the next two days asking about your experience. I hope that your e-visit has been valuable and will speed your recovery.  I have spent 5 minutes in review of e-visit questionnaire, review and updating patient chart, medical decision making and response to patient.   Margaretann Loveless, PA-C

## 2023-10-17 DIAGNOSIS — Z419 Encounter for procedure for purposes other than remedying health state, unspecified: Secondary | ICD-10-CM | POA: Diagnosis not present

## 2023-11-17 DIAGNOSIS — Z419 Encounter for procedure for purposes other than remedying health state, unspecified: Secondary | ICD-10-CM | POA: Diagnosis not present

## 2024-01-02 ENCOUNTER — Telehealth: Admitting: Family Medicine

## 2024-01-02 DIAGNOSIS — K047 Periapical abscess without sinus: Secondary | ICD-10-CM

## 2024-01-02 MED ORDER — PENICILLIN V POTASSIUM 500 MG PO TABS: 500.0000 mg | ORAL_TABLET | Freq: Three times a day (TID) | ORAL | 0 refills | Status: AC

## 2024-01-02 MED ORDER — IBUPROFEN 600 MG PO TABS: 600.0000 mg | ORAL_TABLET | Freq: Three times a day (TID) | ORAL | 0 refills | Status: AC | PRN

## 2024-01-02 NOTE — Progress Notes (Signed)
 E-Visit for Dental Pain  We are sorry that you are not feeling well.  Here is how we plan to help!  Based on what you have shared with me in the questionnaire, it sounds like you have dental infection  Pen VK 500mg  3 times a day for 7 days and Ibuprofen  600mg  3 times a day for 7 days for discomfort  It is imperative that you see a dentist within 10 days of this eVisit to determine the cause of the dental pain and be sure it is adequately treated  A toothache or tooth pain is caused when the nerve in the root of a tooth or surrounding a tooth is irritated. Dental (tooth) infection, decay, injury, or loss of a tooth are the most common causes of dental pain. Pain may also occur after an extraction (tooth is pulled out). Pain sometimes originates from other areas and radiates to the jaw, thus appearing to be tooth pain.Bacteria growing inside your mouth can contribute to gum disease and dental decay, both of which can cause pain. A toothache occurs from inflammation of the central portion of the tooth called pulp. The pulp contains nerve endings that are very sensitive to pain. Inflammation to the pulp or pulpitis may be caused by dental cavities, trauma, and infection.    HOME CARE:   For toothaches: Over-the-counter pain medications such as acetaminophen  or ibuprofen  may be used. Take these as directed on the package while you arrange for a dental appointment. Avoid very cold or hot foods, because they may make the pain worse. You may get relief from biting on a cotton ball soaked in oil of cloves. You can get oil of cloves at most drug stores.  For jaw pain:  Aspirin  may be helpful for problems in the joint of the jaw in adults. If pain happens every time you open your mouth widely, the temporomandibular joint (TMJ) may be the source of the pain. Yawning or taking a large bite of food may worsen the pain. An appointment with your doctor or dentist will help you find the cause.     GET HELP  RIGHT AWAY IF:  You have a high fever or chills If you have had a recent head or face injury and develop headache, light headedness, nausea, vomiting, or other symptoms that concern you after an injury to your face or mouth, you could have a more serious injury in addition to your dental injury. A facial rash associated with a toothache: This condition may improve with medication. Contact your doctor for them to decide what is appropriate. Any jaw pain occurring with chest pain: Although jaw pain is most commonly caused by dental disease, it is sometimes referred pain from other areas. People with heart disease, especially people who have had stents placed, people with diabetes, or those who have had heart surgery may have jaw pain as a symptom of heart attack or angina. If your jaw or tooth pain is associated with lightheadedness, sweating, or shortness of breath, you should see a doctor as soon as possible. Trouble swallowing or excessive pain or bleeding from gums: If you have a history of a weakened immune system, diabetes, or steroid use, you may be more susceptible to infections. Infections can often be more severe and extensive or caused by unusual organisms. Dental and gum infections in people with these conditions may require more aggressive treatment. An abscess may need draining or IV antibiotics, for example.  MAKE SURE YOU   Understand these  instructions. Will watch your condition. Will get help right away if you are not doing well or get worse.  Thank you for choosing an e-visit.  Your e-visit answers were reviewed by a board certified advanced clinical practitioner to complete your personal care plan. Depending upon the condition, your plan could have included both over the counter or prescription medications.  Please review your pharmacy choice. Make sure the pharmacy is open so you can pick up prescription now. If there is a problem, you may contact your provider through [employer] and have the prescription routed to another pharmacy.  Your safety is important to us . If you have drug allergies check your prescription carefully.   For the next 24 hours you can use MyChart to ask questions about today's visit, request a non-urgent call back, or ask for a work or school excuse. You will get an email in the next two days asking about your experience. I hope that your e-visit has been valuable and will speed your recovery.  I have spent 5 minutes in review of e-visit questionnaire, review and updating patient chart, medical decision making and response to patient.   Chiquita CHRISTELLA Barefoot, NP
